# Patient Record
Sex: Female | Born: 1935 | Hispanic: No | Smoking: Never smoker
Health system: Southern US, Community
[De-identification: ages and names within clinical notes are randomized; demographics above are authoritative.]

## PROBLEM LIST (undated history)

## (undated) DIAGNOSIS — S7221XA Displaced subtrochanteric fracture of right femur, initial encounter for closed fracture: Secondary | ICD-10-CM

## (undated) DIAGNOSIS — I10 Essential (primary) hypertension: Secondary | ICD-10-CM

## (undated) DIAGNOSIS — I471 Supraventricular tachycardia: Secondary | ICD-10-CM

## (undated) HISTORY — PX: ABDOMINAL HYSTERECTOMY: SUR658

---

## 1998-09-22 ENCOUNTER — Emergency Department (HOSPITAL_COMMUNITY): Admission: EM | Admit: 1998-09-22 | Discharge: 1998-09-22 | Payer: Self-pay | Admitting: Emergency Medicine

## 1998-09-28 ENCOUNTER — Emergency Department (HOSPITAL_COMMUNITY): Admission: EM | Admit: 1998-09-28 | Discharge: 1998-09-28 | Payer: Self-pay | Admitting: Emergency Medicine

## 1999-09-20 ENCOUNTER — Encounter: Admission: RE | Admit: 1999-09-20 | Discharge: 1999-09-20 | Payer: Self-pay | Admitting: Family Medicine

## 1999-09-22 ENCOUNTER — Encounter: Admission: RE | Admit: 1999-09-22 | Discharge: 1999-09-22 | Payer: Self-pay | Admitting: Family Medicine

## 1999-11-24 ENCOUNTER — Encounter: Admission: RE | Admit: 1999-11-24 | Discharge: 1999-11-24 | Payer: Self-pay | Admitting: Family Medicine

## 2003-12-20 ENCOUNTER — Inpatient Hospital Stay (HOSPITAL_COMMUNITY): Admission: EM | Admit: 2003-12-20 | Discharge: 2003-12-24 | Payer: Self-pay | Admitting: Emergency Medicine

## 2004-01-20 ENCOUNTER — Encounter: Admission: RE | Admit: 2004-01-20 | Discharge: 2004-01-20 | Payer: Self-pay | Admitting: Sports Medicine

## 2006-09-19 DIAGNOSIS — F411 Generalized anxiety disorder: Secondary | ICD-10-CM

## 2006-09-19 DIAGNOSIS — I10 Essential (primary) hypertension: Secondary | ICD-10-CM | POA: Insufficient documentation

## 2007-01-10 ENCOUNTER — Emergency Department (HOSPITAL_COMMUNITY): Admission: EM | Admit: 2007-01-10 | Discharge: 2007-01-10 | Payer: Self-pay | Admitting: Emergency Medicine

## 2010-12-08 NOTE — H&P (Signed)
NAME:  Alicia Schwartz, SLIGHT                     ACCOUNT NO.:  [id number]   MEDICAL RECORD NO.:  [id number]                   PATIENT TYPE:  INP   LOCATION:  3715                                 FACILITY:  [facility]   PHYSICIAN:  Maple Mirza, P.A.              DATE OF BIRTH:  02-10-1931   DATE OF ADMISSION:  12/20/2003  DATE OF DISCHARGE:                                HISTORY & PHYSICAL   PRESENTING PROBLEM:  I had chest pain which circled around to my back two  days ago.   HISTORY OF PRESENT ILLNESS:  Alicia Schwartz is a 77 year old female with no  previous cardiac history.  She is not taking any medications at home at this  time.  She does have a history of gastroesophageal reflux.  After her Sunday  noon meal she had epigastric pain radiating to the back.  The patient says  that she knew better.  She was cooking spicy food and ate quite a bit of it.  She says that she has poor tolerance for this, usually.  The patient felt  epigastrically full, unable to burp, which might have helped relieve her  symptoms.  She took TUMS and Mylanta without alleviating her pain. The pain  eventually resolved when she came to the emergency room, brought by a  neighbor.  She was given aspirin and nitroglycerin sublingually at that  time.  She was found to be hypertensive with a systolic blood pressure of  189.  She was placed on Lopressor, HCTZ and Protonix.  She has had no chest  pain since and she is not having chest pain at the time of this examination.  Electrocardiogram on admission was non-diagnostic with no definitive ST  changes.  Cardiac enzymes x4 are negative.  She has no prior history of  prior cardiac catheterization or even Cardiolite study.   ALLERGIES:  No known drug allergies.   HOME MEDICATIONS:  None.   CURRENT MEDICATIONS:  1. Protonix 40 mg daily.  2. Metoprolol 12.5 mg twice daily.   PAST MEDICAL HISTORY:  1. Hypertension.  2. Question of dyslipidemia.  3. GERD.   SOCIAL HISTORY:  The patient is a widow.  She lives in [place], alone.  She is a retired Data processing manager.  She does not smoke, drink or use  alcoholic beverages.   FAMILY HISTORY:  Mother died at age 97 of Alzheimer's.  Father died after  World War II.  The patient is uncertain of his medical history.  She has  three sisters who are living with no known coronary artery disease, no  diabetes.  She has one sister who died.  She had cancer.   REVIEW OF SYSTEMS:  GENERAL:  The patient is not experiencing in the  immediate pre-hospitalization period fevers, chills, sweats, weight change  or adenopathy.  HEENT:  She has had no recent epistaxis, voice changes,  vertigo or photophobia.  INTEGUMENT:  There are no rashes,  lesions.  CARDIOPULMONARY:  The patient is not having chest pain at the current time.  She is not short of breath.  She has no dyspnea on exertion.  She is a  walker.  She has some palpitations, possibly when she walks.  She describes  her heart as rapid. She has no dependent edema.  No history of syncope or  presyncope.  No claudication symptoms.  UROGENITAL:  The patient apparently  drinks quite a lot of water, she says.  She has nocturia two times per  night.  NEUROPSYCHIATRIC:  No evidence of depression.  Mild anxiety.  MUSCULOSKELETAL:  No arthralgia and no joint swelling deformity or pain.  GI:  GERD.  She has no history of GI bleed either hematemesis or melenic  stool.  ENDOCRINE:  She has no prior endocrine history.  The patient also  denies thyroid disease or work up for thyroid disease.  No history of  diabetes, myocardial infarction, CVA, pulmonary embolism or deep venous  thrombosis or seizure.   PHYSICAL EXAMINATION:  GENERAL:  This is an alert and oriented patient in no  acute distress, perhaps somewhat anxious.  VITAL SIGNS:  Temperature 98.4.  Pulse 67 and regular.  Respirations 21.  Blood pressure 120/67.  Oxygen saturation is 96% on room air.  HEENT:   Eyes, pupils, equal, round and reactive to light.  Extraocular  movements are intact.  The patient wears corrective lenses.  Nares patent.  Mucous membranes are pink and moist without lesion or erythema.  The patient  does not wear dentures.  NECK:  Supple.  No carotid bruits auscultated.  No jugular venous  distention, no cervical lymphadenopathy.  HEART:  Regular rate and rhythm.  S1, S2 with no murmur, gallop or rub.  LUNGS:  Clear to auscultation and percussion, bilaterally.  INTEGUMENT:  No rashes or lesions.  ABDOMEN:  Soft, nondistended.  Bowel sounds are present.  Abdominal aorta,  nonpulsatile.  No hepatosplenomegaly.  UROGENITAL AND RECTAL:  Deferred.  EXTREMITIES:  4/4 pulses, bilaterally at the radial arteries, 4/4 pedal  pulses, bilaterally.  No clubbing, cyanosis or edema.  MUSCULOSKELETAL:  No joint deformity of effusions.  No costovertebral angle  tenderness.  NEUROLOGIC:  No focal neurologic deficits.   Chest x-ray on May 30, tortuosity of the thoracic aorta.  Heart size normal.  Lungs, clear.  Electrocardiogram rate is 83, sinus rhythm, axis +60 degrees.  PR interval 150.  QRS 79.  QTC is 421.  No ST-T changes.   Lab studies, complete blood count, white cells 11, hemoglobin 12, hematocrit  34.5, platelet count 237,000.  Serum electrolytes, sodium 135, potassium  3.4, chloride 102, bicarbonate 24, BUN 10, creatinine 1, glucose 95,  magnesium 2.3, TSH 1.952, BNP less than 30.  Lipase 19, amylase 72.   IMPRESSION:  1. Admitted with chest pain.  Electrocardiogram non-diagnostic.  Cardiac     enzymes essentially negative.  2. Gastroesophageal reflux disease.  3. Hypertension.  4. Unknown lipid profile.   Once again, Troponin-I studies in serial fashion less than 0.05, then 0.01  and then less than 0.01 and then 0.05.   PLAN:  After discussion with Dr. Daleen Squibb who has seen the patient, discussed the patient's plan with her, the patient will have adenosine Cardiolite   study as an inpatient.  This is decided as an inpatient since the patient is  unable to obtain transportation easily to [facility] as an outpatient.  This  will be done December 22, 2003, adenosine  Cardiolite study.                                               Maple Mirza, P.A.   GM/MEDQ  D:  12/21/2003  T:  12/21/2003  Job:  [id number]

## 2010-12-08 NOTE — Discharge Summary (Signed)
NAME:  Alicia Schwartz, DEMOND                     ACCOUNT NO.:  [id number]   MEDICAL RECORD NO.:  [id number]                   PATIENT TYPE:  INP   LOCATION:  3715                                 FACILITY:  [facility]   PHYSICIAN:  Blanch Media, M.D.             DATE OF BIRTH:  [birth year]   DATE OF ADMISSION:  12/20/2003  DATE OF DISCHARGE:  12/24/2003                                 DISCHARGE SUMMARY   DISCHARGE DIAGNOSES:  1. Chest pain of noncardiac etiology.  2. Hypertension.  3. Gastroesophageal reflux disease.  4. Hyponatremia.   DISCHARGE MEDICATIONS:  1. Metoprolol 12.5 mg p.o. b.i.d.  2. Ranitidine 75 mg p.o. daily.  3. Multivitamins every day.  4. Iron pill every day.  5. Aspirin 81 mg p.o. daily.   LABORATORY AND X-RAY DATA:  On admission, cardiac enzymes x 3 were negative.  Lipid profile revealed total cholesterol 154, triglycerides 69, HDL 48, LDL  92.  Folate was normal.  CBC on admission revealed a white count of 11.0,  hemoglobin 12, hematocrit 34.5, platelets 237.  Basic metabolic panel on  admission revealed sodium 135, potassium 3.4, chloride 102, bicarb 24, BUN  10, creatinine 1, glucose 95, calcium 8.7.   CONSULTATIONS:  Cardiology:  [facility].   PROCEDURES:  1. Chest x-ray performed on admission.  Results:  There is no acute disease.  2. Adenosine Cardiolite study performed December 22, 2003.  Results:  Ejection     fraction 49%, no evidence of ischemia or infarction.   HISTORY OF PRESENT ILLNESS:  Alicia Schwartz is a 77 year old female who has no  previous cardiac history and was on no medications at the time, who noted  some epigastric pain radiating to her back after eating a meal.  The pain  was not relieved.  In the ER, she was found to have a systolic blood  pressure of 189 and was given treatment for this.  However, there were no  significant changes on EKG.   HOSPITAL COURSE:  #1.  CHEST PAIN OF NONCARDIAC ETIOLOGY:  As  stated above, the cardiac workup  turned out to be negative and, thus, the chest pain is being attributed to  gastroesophageal reflux disease.  The patient was placed on Protonix while  in the hospital and was also given Maalox for indigestion.  She had no  further bouts of chest pain and had no cardiac dysrhythmias on telemetry.  Also as stated above, cardiac enzymes were negative.  She was cleared from a  cardiology standpoint, and decision was made to keep her on aspirin and  metoprolol for blood pressure control.   #2.  GASTROESOPHAGEAL REFLUX DISEASE:  As stated above, the patient was  placed on Protonix while in hospital and had resolution of the chest and  abdominal pain.  She will be discharged with ranitidine 75 mg p.o. daily due  to financial concerns.  Hopefully this will help to control  her reflux  disease.   #3.  HYPONATREMIA:  As stated above, the patient had initial sodium of 137;  however, by hospital day #3, her sodium had dropped to 126.  I do not have  an explanation for this drop in sodium other than the patient was possibly  volume contracted.  Following a sodium of 126, the patient was given 100 ml  of normal saline per hour until the next day, and her sodium rose to 133 on  the day of discharge.  A urine sodium was collected, and it was 16.  Urine  osmolality was 142.  This dose support the hypothesis that the patient was  intravascularly dry and the reasoning that she improved with normal saline  infusion.   SUGGESTED FOLLOWUP ITEMS:  The patient should likely have a basic metabolic  panel drawn on hospital followup to make sure her sodium remains normal.   FOLLOW UP:  The patient has a followup appointment with Dr. Leitha Schuller at  [facility] on June 30 at 3:30 p.m.      Franchot Mimes, MD                         Blanch Media, M.D.    TV/MEDQ  D:  12/24/2003  T:  12/26/2003  Job:  [id number]

## 2012-07-25 ENCOUNTER — Ambulatory Visit: Payer: Self-pay | Admitting: Dietician

## 2012-08-22 ENCOUNTER — Ambulatory Visit: Payer: Self-pay | Admitting: Dietician

## 2015-02-11 ENCOUNTER — Encounter (HOSPITAL_COMMUNITY): Payer: Self-pay | Admitting: Emergency Medicine

## 2015-02-11 ENCOUNTER — Emergency Department (HOSPITAL_COMMUNITY): Payer: Medicare PPO

## 2015-02-11 ENCOUNTER — Inpatient Hospital Stay (HOSPITAL_COMMUNITY)
Admission: EM | Admit: 2015-02-11 | Discharge: 2015-02-16 | DRG: 481 | Disposition: A | Discharge status: Skilled Nursing Facility | Payer: Medicare PPO | Attending: Internal Medicine | Admitting: Internal Medicine

## 2015-02-11 DIAGNOSIS — Y92480 Sidewalk as the place of occurrence of the external cause: Secondary | ICD-10-CM

## 2015-02-11 DIAGNOSIS — I471 Supraventricular tachycardia, unspecified: Secondary | ICD-10-CM | POA: Diagnosis not present

## 2015-02-11 DIAGNOSIS — Z419 Encounter for procedure for purposes other than remedying health state, unspecified: Secondary | ICD-10-CM

## 2015-02-11 DIAGNOSIS — S72141A Displaced intertrochanteric fracture of right femur, initial encounter for closed fracture: Principal | ICD-10-CM | POA: Diagnosis present

## 2015-02-11 DIAGNOSIS — S72001A Fracture of unspecified part of neck of right femur, initial encounter for closed fracture: Secondary | ICD-10-CM | POA: Diagnosis not present

## 2015-02-11 DIAGNOSIS — Z8781 Personal history of (healed) traumatic fracture: Secondary | ICD-10-CM

## 2015-02-11 DIAGNOSIS — F419 Anxiety disorder, unspecified: Secondary | ICD-10-CM | POA: Diagnosis present

## 2015-02-11 DIAGNOSIS — D62 Acute posthemorrhagic anemia: Secondary | ICD-10-CM | POA: Diagnosis not present

## 2015-02-11 DIAGNOSIS — M25551 Pain in right hip: Secondary | ICD-10-CM | POA: Diagnosis present

## 2015-02-11 DIAGNOSIS — F411 Generalized anxiety disorder: Secondary | ICD-10-CM | POA: Diagnosis present

## 2015-02-11 DIAGNOSIS — W1849XA Other slipping, tripping and stumbling without falling, initial encounter: Secondary | ICD-10-CM | POA: Diagnosis not present

## 2015-02-11 DIAGNOSIS — Z79899 Other long term (current) drug therapy: Secondary | ICD-10-CM | POA: Diagnosis not present

## 2015-02-11 DIAGNOSIS — Z9889 Other specified postprocedural states: Secondary | ICD-10-CM

## 2015-02-11 DIAGNOSIS — W010XXA Fall on same level from slipping, tripping and stumbling without subsequent striking against object, initial encounter: Secondary | ICD-10-CM | POA: Diagnosis present

## 2015-02-11 DIAGNOSIS — Y9301 Activity, walking, marching and hiking: Secondary | ICD-10-CM

## 2015-02-11 DIAGNOSIS — S7221XA Displaced subtrochanteric fracture of right femur, initial encounter for closed fracture: Secondary | ICD-10-CM | POA: Diagnosis not present

## 2015-02-11 DIAGNOSIS — I10 Essential (primary) hypertension: Secondary | ICD-10-CM | POA: Diagnosis not present

## 2015-02-11 DIAGNOSIS — W19XXXA Unspecified fall, initial encounter: Secondary | ICD-10-CM

## 2015-02-11 HISTORY — DX: Displaced subtrochanteric fracture of right femur, initial encounter for closed fracture: S72.21XA

## 2015-02-11 HISTORY — DX: Supraventricular tachycardia: I47.1

## 2015-02-11 HISTORY — DX: Essential (primary) hypertension: I10

## 2015-02-11 LAB — CBC
HEMATOCRIT: 32.6 % — AB (ref 36.0–46.0)
HEMOGLOBIN: 11.4 g/dL — AB (ref 12.0–15.0)
MCH: 34.3 pg — AB (ref 26.0–34.0)
MCHC: 35 g/dL (ref 30.0–36.0)
MCV: 98.2 fL (ref 78.0–100.0)
PLATELETS: 328 10*3/uL (ref 150–400)
RBC: 3.32 MIL/uL — ABNORMAL LOW (ref 3.87–5.11)
RDW: 12.1 % (ref 11.5–15.5)
WBC: 10.6 10*3/uL — AB (ref 4.0–10.5)

## 2015-02-11 LAB — BASIC METABOLIC PANEL
Anion gap: 11 (ref 5–15)
BUN: 14 mg/dL (ref 6–20)
CALCIUM: 9.2 mg/dL (ref 8.9–10.3)
CHLORIDE: 101 mmol/L (ref 101–111)
CO2: 22 mmol/L (ref 22–32)
CREATININE: 1.29 mg/dL — AB (ref 0.44–1.00)
GFR, EST AFRICAN AMERICAN: 43 mL/min — AB (ref >60)
GFR, EST NON AFRICAN AMERICAN: 37 mL/min — AB (ref >60)
GLUCOSE: 169 mg/dL — AB (ref 65–99)
POTASSIUM: 3.6 mmol/L (ref 3.5–5.1)
Sodium: 134 mmol/L — ABNORMAL LOW (ref 135–145)

## 2015-02-11 LAB — ABO/RH: ABO/RH(D): B POS

## 2015-02-11 LAB — TYPE AND SCREEN
ABO/RH(D): B POS
ANTIBODY SCREEN: NEGATIVE

## 2015-02-11 LAB — PROTIME-INR
INR: 0.98 (ref 0.00–1.49)
PROTHROMBIN TIME: 13.2 s (ref 11.6–15.2)

## 2015-02-11 MED ORDER — ONDANSETRON HCL 4 MG/2ML IJ SOLN
4.0000 mg | Freq: Once | INTRAMUSCULAR | Status: AC
  Administered 2015-02-11: 4 mg via INTRAVENOUS
  Filled 2015-02-11: qty 2

## 2015-02-11 MED ORDER — ALUM & MAG HYDROXIDE-SIMETH 200-200-20 MG/5ML PO SUSP: 30.0000 mL | Freq: Four times a day (QID) | ORAL | Status: DC | PRN

## 2015-02-11 MED ORDER — ONDANSETRON HCL 4 MG/2ML IJ SOLN: 4.0000 mg | Freq: Four times a day (QID) | INTRAMUSCULAR | Status: DC | PRN

## 2015-02-11 MED ORDER — ACETAMINOPHEN 650 MG RE SUPP: 650.0000 mg | Freq: Four times a day (QID) | RECTAL | Status: DC | PRN

## 2015-02-11 MED ORDER — OXYCODONE HCL 5 MG PO TABS
5.0000 mg | ORAL_TABLET | ORAL | Status: DC | PRN
  Administered 2015-02-12: 5 mg via ORAL
  Filled 2015-02-11: qty 1

## 2015-02-11 MED ORDER — MORPHINE SULFATE 4 MG/ML IJ SOLN
4.0000 mg | Freq: Once | INTRAMUSCULAR | Status: AC
  Administered 2015-02-11: 4 mg via INTRAVENOUS
  Filled 2015-02-11: qty 1

## 2015-02-11 MED ORDER — SODIUM CHLORIDE 0.9 % IV SOLN
INTRAVENOUS | Status: DC
  Administered 2015-02-11: 75 mL/h via INTRAVENOUS

## 2015-02-11 MED ORDER — ONDANSETRON HCL 4 MG PO TABS: 4.0000 mg | ORAL_TABLET | Freq: Four times a day (QID) | ORAL | Status: DC | PRN

## 2015-02-11 MED ORDER — HYDROMORPHONE HCL 1 MG/ML IJ SOLN
0.5000 mg | INTRAMUSCULAR | Status: DC | PRN
  Administered 2015-02-11: 0.5 mg via INTRAVENOUS
  Filled 2015-02-11: qty 1

## 2015-02-11 MED ORDER — ACETAMINOPHEN 325 MG PO TABS: 650.0000 mg | ORAL_TABLET | Freq: Four times a day (QID) | ORAL | Status: DC | PRN

## 2015-02-11 NOTE — ED Notes (Signed)
[facility] called for transport. 

## 2015-02-11 NOTE — ED Provider Notes (Signed)
CSN: [id number]     Arrival date & time 02/11/15  1642 History   First MD Initiated Contact with Patient 02/11/15 1704     Chief Complaint  Patient presents with  . Fall     (Consider location/radiation/quality/duration/timing/severity/associated sxs/prior Treatment) HPI  Pt presenting with right sided hip pain after mechanical fall.  She states she got tripped on a sidewalk.  She denies striking her head, no neck or back pain.  She was brought in by EMS, given fentanyl which helped somewhat with her pain.  Pain is worse with movement and palpation.  Fall occurred just prior to arrival.  Pain is constant. There are no other associated systemic symptoms, there are no other alleviating or modifying factors.   Past Medical History  Diagnosis Date  . Hypertension   . Fracture, subtrochanteric, right femur, closed 02/11/2015  . Paroxysmal supraventricular tachycardia 02/12/2015   Past Surgical History  Procedure Laterality Date  . Abdominal hysterectomy     Family History  Problem Relation Age of Onset  . Alzheimer's disease Mother   . Prostate cancer Father    History  Substance Use Topics  . Smoking status: Never Smoker   . Smokeless tobacco: Not on file  . Alcohol Use: No   OB History    No data available     Review of Systems  ROS reviewed and all otherwise negative except for mentioned in HPI    Allergies  Review of patient's allergies indicates not on file.  Home Medications   Prior to Admission medications   Medication Sig Start Date End Date Taking? Authorizing Provider  losartan (COZAAR) 25 MG tablet Take 1 tablet by mouth daily. 01/14/15  Yes Historical Provider, MD  Multiple Vitamins-Minerals (MULTIVITAMIN GUMMIES ADULT PO) Take 1 capsule by mouth daily.   Yes Historical Provider, MD  spironolactone (ALDACTONE) 25 MG tablet Take 1 tablet by mouth daily. 01/22/15  Yes Historical Provider, MD  baclofen (LIORESAL) 10 MG tablet Take 1 tablet (10 mg total) by mouth 3  (three) times daily. As needed for muscle spasm 02/12/15   Teryl Lucy, MD  enoxaparin (LOVENOX) 40 MG/0.4ML injection Inject 0.4 mLs (40 mg total) into the skin daily. 02/12/15   Teryl Lucy, MD  HYDROcodone-acetaminophen (NORCO) 5-325 MG per tablet Take 1-2 tablets by mouth every 6 (six) hours as needed for moderate pain. MAXIMUM TOTAL ACETAMINOPHEN DOSE IS 4000 MG PER DAY 02/12/15   Teryl Lucy, MD  sennosides-docusate sodium (SENOKOT-S) 8.6-50 MG tablet Take 2 tablets by mouth daily. 02/12/15   Teryl Lucy, MD   BP 149/76 mmHg  Pulse 85  Temp(Src) 97.9 F (36.6 C) (Oral)  Resp 17  Ht  (1.473 m)  Wt 115 lb 8.3 oz (52.4 kg)  BMI 24.15 kg/m2  SpO2 100%  Vitals reviewed Physical Exam  Physical Examination: General appearance - alert, well appearing, and in no distress Mental status - alert, oriented to person, place, and time Eyes - no conjunctival injection, no scleral icterus Neck - no midline tenderness to palpation Chest - clear to auscultation, no wheezes, rales or rhonchi, symmetric air entry Heart - normal rate, regular rhythm, normal S1, S2, no murmurs, rubs, clicks or gallops Back exam - no midline tenderness to palpation Neurological - alert, oriented, normal speech, strength 5/5 in extremities x 4, sensation intact Musculoskeletal - right hip and thigh with tenderness to palpation, right lower extremity externally rotated, otherwise no joint tenderness, deformity or swelling Extremities - peripheral pulses normal, no pedal  edema, no clubbing or cyanosis Skin - normal coloration and turgor, no rashes  ED Course  Procedures (including critical care time) Labs Review Labs Reviewed  CBC - Abnormal; Notable for the following:    WBC 10.6 (*)    RBC 3.32 (*)    Hemoglobin 11.4 (*)    HCT 32.6 (*)    MCH 34.3 (*)    All other components within normal limits  BASIC METABOLIC PANEL - Abnormal; Notable for the following:    Sodium 134 (*)    Glucose, Bld 169 (*)     Creatinine, Ser 1.29 (*)    GFR calc non Af Amer 37 (*)    GFR calc Af Amer 43 (*)    All other components within normal limits  BASIC METABOLIC PANEL - Abnormal; Notable for the following:    Sodium 132 (*)    Glucose, Bld 133 (*)    Creatinine, Ser 1.25 (*)    Calcium 8.8 (*)    GFR calc non Af Amer 39 (*)    GFR calc Af Amer 45 (*)    All other components within normal limits  CBC - Abnormal; Notable for the following:    WBC 13.3 (*)    RBC 3.00 (*)    Hemoglobin 10.1 (*)    HCT 29.1 (*)    All other components within normal limits  BASIC METABOLIC PANEL - Abnormal; Notable for the following:    Sodium 130 (*)    Chloride 100 (*)    Glucose, Bld 100 (*)    Creatinine, Ser 1.04 (*)    Calcium 8.2 (*)    GFR calc non Af Amer 49 (*)    GFR calc Af Amer 56 (*)    All other components within normal limits  CBC - Abnormal; Notable for the following:    WBC 14.2 (*)    RBC 3.49 (*)    Hemoglobin 11.0 (*)    HCT 31.6 (*)    RDW 16.2 (*)    All other components within normal limits  POCT I-STAT 4, (NA,K, GLUC, HGB,HCT) - Abnormal; Notable for the following:    Sodium 134 (*)    Glucose, Bld 123 (*)    HCT 26.0 (*)    Hemoglobin 8.8 (*)    All other components within normal limits  SURGICAL PCR SCREEN  PROTIME-INR  CBC  BASIC METABOLIC PANEL  TYPE AND SCREEN  ABO/RH  PREPARE RBC (CROSSMATCH)  TYPE AND SCREEN  ABO/RH    Imaging Review Pelvis Portable  02/12/2015   CLINICAL DATA:  Postop ORIF intertrochanteric right femoral neck fracture.  EXAM: PORTABLE PELVIS 1-2 VIEWS  COMPARISON:  Intraoperative examination earlier same day and 02/11/2015.  FINDINGS: ORIF of the intertrochanteric right femoral neck fracture with intramedullary nail and compression screw. Near anatomic alignment in the AP projection. No fractures elsewhere. Sclerotic focus in the inferior right acetabulum unchanged, likely benign bone island. Osseous demineralization.  IMPRESSION: Near anatomic  alignment post ORIF of the intertrochanteric right femoral neck fracture.   Electronically Signed   By: Hulan Saas M.D.   On: 02/12/2015 11:07   Dg Hip Operative Unilat With Pelvis Right  02/12/2015   CLINICAL DATA:  Proximal right femoral fracture  EXAM: OPERATIVE right HIP (WITH PELVIS IF PERFORMED) 4 VIEWS  TECHNIQUE: Fluoroscopic spot image(s) were submitted for interpretation post-operatively.  COMPARISON:  02/11/2015  FLUOROSCOPY TIME: 1 min 31 seconds  FINDINGS: Medullary rod is now seen within the right femur.  A fixation screw is noted traversing the femoral neck. Distal fixation screws noted as well. Fracture fragments are in near anatomic alignment.  IMPRESSION: Status post ORIF of proximal right femoral fracture   Electronically Signed   By: [name] M.D.   On: 02/12/2015 09:35     EKG Interpretation None      MDM   Final diagnoses:  Hip fracture, right, closed, initial encounter    Pt presenting with c/o pain in right hip, after mechanical fall.  No other areas with signs of injury, Xray shows right intertrochanteric fracture.   Xray images reviewed and interpreted by me as well.   Pt treated with pain meds in the ED.     7:38 PM d/w Dr. Dion Saucier, orthopedics.  He requests patient to be transferred to [facility] and will plan for OR tomorrow.   7:49 PM d/w Dr. Lovell Sheehan, triad for admission.    Jerelyn Scott, MD 02/13/15 [phone]

## 2015-02-11 NOTE — ED Notes (Signed)
Per EMS-fall walking on uneven sidewalk-complaining of right hip pain-no LOC, did not hit head-no over signs of shortening-100 mcg of Fentanyl given in route

## 2015-02-11 NOTE — ED Notes (Signed)
Bed: [facility] Expected date: 02/11/15 Expected time:  Means of arrival: Ambulance Comments: 81 yo Fall/HBP Right HIP/FEMUR

## 2015-02-11 NOTE — H&P (Signed)
[facility] Admission History and Physical       Alicia Schwartz MVH:[id number] DOB: [birth year] DOA: 02/11/2015  Referring physician: EDP PCP: No primary care provider on file.  Specialists:   Chief Complaint: Right Hip Pain after Falling  HPI: Alicia Schwartz is a 81 y.o. female with a history of HTN, who presents to the ED with complaints of 9/10 right hip pain after falling.  She reports that she was walking on uneven pavement and stumbled and fell onto her right hip.    She was brought to the ED via EMS, and in the ED was found on X-ray to have an Intertrochanteric fracture with Subtrochanteric extension of the Right Hip  Review of Systems:  Constitutional: No Weight Loss, No Weight Gain, Night Sweats, Fevers, Chills, Dizziness, Light Headedness, Fatigue, or Generalized Weakness HEENT: No Headaches, Difficulty Swallowing,Tooth/Dental Problems,Sore Throat,  No Sneezing, Rhinitis, Ear Ache, Nasal Congestion, or Post Nasal Drip,  Cardio-vascular:  No Chest pain, Orthopnea, PND, Edema in Lower Extremities, Anasarca, Dizziness, Palpitations  Resp: No Dyspnea, No DOE, No Productive Cough, No Non-Productive Cough, No Hemoptysis, No Wheezing.    GI: No Heartburn, Indigestion, Abdominal Pain, Nausea, Vomiting, Diarrhea, Constipation, Hematemesis, Hematochezia, Melena, Change in Bowel Habits,  Loss of Appetite  GU: No Dysuria, No Change in Color of Urine, No Urgency or Urinary Frequency, No Flank pain.  Musculoskeletal: +Right Hip Pain, with Decreased Range of Motion, No Back Pain.  Neurologic: No Syncope, No Seizures, Muscle Weakness, Paresthesia, Vision Disturbance or Loss, No Diplopia, No Vertigo, No Difficulty Walking,  Skin: No Rash or Lesions. Psych: No Change in Mood or Affect, No Depression or Anxiety, No Memory loss, No Confusion, or Hallucinations   Past Medical History  Diagnosis Date  . Hypertension      Past Surgical History  Procedure Laterality Date  .  Abdominal hysterectomy        Prior to Admission medications   Medication Sig Start Date End Date Taking? Authorizing Provider  losartan (COZAAR) 25 MG tablet Take 1 tablet by mouth daily. 01/14/15  Yes Historical Provider, MD  Multiple Vitamins-Minerals (MULTIVITAMIN GUMMIES ADULT PO) Take 1 capsule by mouth daily.   Yes Historical Provider, MD  spironolactone (ALDACTONE) 25 MG tablet Take 1 tablet by mouth daily. 01/22/15  Yes Historical Provider, MD     Allergies not on file  Social History:  reports that she has never smoked. She does not have any smokeless tobacco history on file. She reports that she does not drink alcohol. Her drug history is not on file.    Family History  Problem Relation Age of Onset  . Alzheimer's disease Mother   . Prostate cancer Father        Physical Exam:  GEN:  Pleasant Well Nourished and Well Developed Elderly  81 y.o. Caucasian female examined and in no acute distress; cooperative with exam Filed Vitals:   02/11/15 1656 02/11/15 1957  BP: 189/69 158/65  Pulse: 77 82  Temp: 98.2 F (36.8 C) 98 F (36.7 C)  TempSrc: Oral Oral  Resp: 18 16  SpO2: 99% 100%   Blood pressure 158/65, pulse 82, temperature 98 F (36.7 C), temperature source Oral, resp. rate 16, SpO2 100 %. PSYCH: She is alert and oriented x4; does not appear anxious does not appear depressed; affect is normal HEENT: Normocephalic and Atraumatic, Mucous membranes pink; PERRLA; EOM intact; Fundi:  Benign;  No scleral icterus, Nares: Patent, Oropharynx: Clear, Edentulous   Neck:  FROM, No  Cervical Lymphadenopathy nor Thyromegaly or Carotid Bruit; No JVD; Breasts:: Not examined CHEST WALL: No tenderness CHEST: Normal respiration, clear to auscultation bilaterally HEART: Regular rate and rhythm; no murmurs rubs or gallops BACK: No kyphosis or scoliosis; No CVA tenderness ABDOMEN: Positive Bowel Sounds, Scaphoid, Obese, Soft Non-Tender, No Rebound or Guarding; No Masses, No  Organomegaly, No Pannus; No Intertriginous candida. Rectal Exam: Not done EXTREMITIES: No Cyanosis, Clubbing, or Edema; No Ulcerations. Genitalia: not examined PULSES: 2+ and symmetric SKIN: Normal hydration no rash or ulceration CNS:  Alert and Oriented x 4, No Focal Deficits except HOH ( has Bilateral Hearing Aids) Vascular: pulses palpable throughout    Labs on Admission:  Basic Metabolic Panel:  Recent Labs Lab 02/11/15 1759  NA 134*  K 3.6  CL 101  CO2 22  GLUCOSE 169*  BUN 14  CREATININE 1.29*  CALCIUM 9.2   Liver Function Tests: No results for input(s): AST, ALT, ALKPHOS, BILITOT, PROT, ALBUMIN in the last 168 hours. No results for input(s): LIPASE, AMYLASE in the last 168 hours. No results for input(s): AMMONIA in the last 168 hours. CBC:  Recent Labs Lab 02/11/15 1759  WBC 10.6*  HGB 11.4*  HCT 32.6*  MCV 98.2  PLT 328   Cardiac Enzymes: No results for input(s): CKTOTAL, CKMB, CKMBINDEX, TROPONINI in the last 168 hours.  BNP (last 3 results) No results for input(s): BNP in the last 8760 hours.  ProBNP (last 3 results) No results for input(s): PROBNP in the last 8760 hours.  CBG: No results for input(s): GLUCAP in the last 168 hours.  Radiological Exams on Admission: Dg Hip Unilat With Pelvis 2-3 Views Right  02/11/2015   CLINICAL DATA:  Status post fall today. Severe right hip pain. Initial encounter.  EXAM: DG HIP (WITH OR WITHOUT PELVIS) 2-3V RIGHT  COMPARISON:  None.  FINDINGS: The patient has an intertrochanteric right femur fracture with subtrochanteric extension. The femoral head is located. No other acute abnormality is identified.  IMPRESSION: Intertrochanteric fracture with subtrochanteric extension.   Electronically Signed   By: Drusilla Kanner M.D.   On: 02/11/2015 18:05     EKG: Independently reviewed.    Assessment/Plan:   81 y.o. female with  Principal Problem:   1.     Closed right hip fracture/Hip fracture   Orthopedic: [place]:  Plan for Surgery in AM 02/12/2015   NPO after Midnight   Pain Control with IV Dilaudid PRN   Bedrest   Active Problems:   2.    Fall due to stumbling- Mechanical Fall      3.    HYPERTENSION, BENIGN SYSTEMIC   Home Meds include Losartan, and Spironolactone   IV Hydralazine PRN SBP > 160 while NPO     4.    Anxiety state   PRN IV Ativan     5.    DVT Prophylaxis   SCDs         Code Status:     FULL CODE      Family Communication:   Niece at Bedside   Disposition Plan:    Inpatient Status        Time spent:  53 Minutes      Ron Parker [facility] Pager [phone]   If 7AM -7PM Please Contact the Day Rounding Team MD for [facility]  If 7PM-7AM, Please Contact Night-Floor Coverage  [email or web address] Password [email or web address] 02/11/2015, 8:22 PM     ADDENDUM:   Patient was seen and examined on 02/11/2015

## 2015-02-12 ENCOUNTER — Inpatient Hospital Stay (HOSPITAL_COMMUNITY): Payer: Medicare PPO | Admitting: Anesthesiology

## 2015-02-12 ENCOUNTER — Inpatient Hospital Stay (HOSPITAL_COMMUNITY): Payer: Medicare PPO

## 2015-02-12 ENCOUNTER — Encounter (HOSPITAL_COMMUNITY): Payer: Self-pay | Admitting: Orthopedic Surgery

## 2015-02-12 ENCOUNTER — Encounter (HOSPITAL_COMMUNITY)
Admission: EM | Disposition: A | Discharge status: Skilled Nursing Facility | Payer: Self-pay | Source: Home / Self Care | Attending: Internal Medicine

## 2015-02-12 DIAGNOSIS — I471 Supraventricular tachycardia, unspecified: Secondary | ICD-10-CM

## 2015-02-12 HISTORY — DX: Supraventricular tachycardia, unspecified: I47.10

## 2015-02-12 HISTORY — DX: Supraventricular tachycardia: I47.1

## 2015-02-12 HISTORY — PX: INTRAMEDULLARY (IM) NAIL INTERTROCHANTERIC: SHX5875

## 2015-02-12 LAB — POCT I-STAT 4, (NA,K, GLUC, HGB,HCT)
Glucose, Bld: 123 mg/dL — ABNORMAL HIGH (ref 65–99)
HCT: 26 % — ABNORMAL LOW (ref 36.0–46.0)
HEMOGLOBIN: 8.8 g/dL — AB (ref 12.0–15.0)
POTASSIUM: 4.2 mmol/L (ref 3.5–5.1)
Sodium: 134 mmol/L — ABNORMAL LOW (ref 135–145)

## 2015-02-12 LAB — BASIC METABOLIC PANEL
Anion gap: 6 (ref 5–15)
BUN: 9 mg/dL (ref 6–20)
CO2: 23 mmol/L (ref 22–32)
Calcium: 8.8 mg/dL — ABNORMAL LOW (ref 8.9–10.3)
Chloride: 103 mmol/L (ref 101–111)
Creatinine, Ser: 1.25 mg/dL — ABNORMAL HIGH (ref 0.44–1.00)
GFR, EST AFRICAN AMERICAN: 45 mL/min — AB (ref >60)
GFR, EST NON AFRICAN AMERICAN: 39 mL/min — AB (ref >60)
Glucose, Bld: 133 mg/dL — ABNORMAL HIGH (ref 65–99)
Potassium: 4.2 mmol/L (ref 3.5–5.1)
SODIUM: 132 mmol/L — AB (ref 135–145)

## 2015-02-12 LAB — CBC
HCT: 29.1 % — ABNORMAL LOW (ref 36.0–46.0)
HEMOGLOBIN: 10.1 g/dL — AB (ref 12.0–15.0)
MCH: 33.7 pg (ref 26.0–34.0)
MCHC: 34.7 g/dL (ref 30.0–36.0)
MCV: 97 fL (ref 78.0–100.0)
Platelets: 309 10*3/uL (ref 150–400)
RBC: 3 MIL/uL — ABNORMAL LOW (ref 3.87–5.11)
RDW: 12.2 % (ref 11.5–15.5)
WBC: 13.3 10*3/uL — AB (ref 4.0–10.5)

## 2015-02-12 LAB — ABO/RH: ABO/RH(D): B POS

## 2015-02-12 LAB — SURGICAL PCR SCREEN
MRSA, PCR: NEGATIVE
Staphylococcus aureus: NEGATIVE

## 2015-02-12 LAB — PREPARE RBC (CROSSMATCH)

## 2015-02-12 SURGERY — FIXATION, FRACTURE, INTERTROCHANTERIC, WITH INTRAMEDULLARY ROD
Anesthesia: General | Site: Leg Upper | Laterality: Right

## 2015-02-12 MED ORDER — PHENOL 1.4 % MT LIQD: 1.0000 | OROMUCOSAL | Status: DC | PRN

## 2015-02-12 MED ORDER — BISACODYL 10 MG RE SUPP: 10.0000 mg | Freq: Every day | RECTAL | Status: DC | PRN

## 2015-02-12 MED ORDER — DEXAMETHASONE SODIUM PHOSPHATE 4 MG/ML IJ SOLN
INTRAMUSCULAR | Status: AC
  Filled 2015-02-12: qty 1

## 2015-02-12 MED ORDER — THIAMINE HCL 100 MG/ML IJ SOLN: 100.0000 mg | Freq: Every day | INTRAMUSCULAR | Status: DC

## 2015-02-12 MED ORDER — ACETAMINOPHEN 325 MG PO TABS: 650.0000 mg | ORAL_TABLET | Freq: Four times a day (QID) | ORAL | Status: DC | PRN

## 2015-02-12 MED ORDER — POLYETHYLENE GLYCOL 3350 17 G PO PACK
17.0000 g | PACK | Freq: Every day | ORAL | Status: DC | PRN
  Filled 2015-02-12: qty 1

## 2015-02-12 MED ORDER — ESMOLOL HCL 10 MG/ML IV SOLN
INTRAVENOUS | Status: AC
  Filled 2015-02-12: qty 10

## 2015-02-12 MED ORDER — ENOXAPARIN SODIUM 40 MG/0.4ML ~~LOC~~ SOLN
40.0000 mg | SUBCUTANEOUS | Status: DC
  Administered 2015-02-13: 40 mg via SUBCUTANEOUS
  Filled 2015-02-12: qty 0.4

## 2015-02-12 MED ORDER — FOLIC ACID 1 MG PO TABS: 1.0000 mg | ORAL_TABLET | Freq: Every day | ORAL | Status: DC

## 2015-02-12 MED ORDER — MORPHINE SULFATE 2 MG/ML IJ SOLN
2.0000 mg | INTRAMUSCULAR | Status: DC | PRN
  Administered 2015-02-12: 2 mg via INTRAVENOUS
  Filled 2015-02-12: qty 1

## 2015-02-12 MED ORDER — ACETAMINOPHEN 650 MG RE SUPP: 650.0000 mg | Freq: Four times a day (QID) | RECTAL | Status: DC | PRN

## 2015-02-12 MED ORDER — LIDOCAINE HCL (CARDIAC) 20 MG/ML IV SOLN
INTRAVENOUS | Status: DC | PRN
  Administered 2015-02-12: 20 mg via INTRAVENOUS

## 2015-02-12 MED ORDER — FENTANYL CITRATE (PF) 100 MCG/2ML IJ SOLN
INTRAMUSCULAR | Status: AC
  Administered 2015-02-12: 50 ug via INTRAVENOUS
  Filled 2015-02-12: qty 2

## 2015-02-12 MED ORDER — ONDANSETRON HCL 4 MG PO TABS: 4.0000 mg | ORAL_TABLET | Freq: Four times a day (QID) | ORAL | Status: DC | PRN

## 2015-02-12 MED ORDER — SENNA 8.6 MG PO TABS
1.0000 | ORAL_TABLET | Freq: Two times a day (BID) | ORAL | Status: DC
  Administered 2015-02-12 – 2015-02-16 (×7): 8.6 mg via ORAL
  Filled 2015-02-12 (×8): qty 1

## 2015-02-12 MED ORDER — CEFAZOLIN SODIUM-DEXTROSE 2-3 GM-% IV SOLR
2.0000 g | INTRAVENOUS | Status: AC
  Administered 2015-02-12: 2 g via INTRAVENOUS
  Filled 2015-02-12: qty 50

## 2015-02-12 MED ORDER — PROPOFOL 10 MG/ML IV BOLUS
INTRAVENOUS | Status: AC
  Filled 2015-02-12: qty 20

## 2015-02-12 MED ORDER — ONDANSETRON HCL 4 MG/2ML IJ SOLN
4.0000 mg | Freq: Four times a day (QID) | INTRAMUSCULAR | Status: DC | PRN
Start: 2015-02-12 — End: 2015-02-16

## 2015-02-12 MED ORDER — PROPOFOL 10 MG/ML IV BOLUS
INTRAVENOUS | Status: DC | PRN
  Administered 2015-02-12: 70 mg via INTRAVENOUS

## 2015-02-12 MED ORDER — LORAZEPAM 2 MG/ML IJ SOLN: 1.0000 mg | Freq: Four times a day (QID) | INTRAMUSCULAR | Status: DC | PRN

## 2015-02-12 MED ORDER — HYDROCODONE-ACETAMINOPHEN 5-325 MG PO TABS: 1.0000 | ORAL_TABLET | Freq: Four times a day (QID) | ORAL | Status: DC | PRN

## 2015-02-12 MED ORDER — CEFAZOLIN SODIUM-DEXTROSE 2-3 GM-% IV SOLR
2.0000 g | Freq: Two times a day (BID) | INTRAVENOUS | Status: AC
  Administered 2015-02-12 – 2015-02-13 (×2): 2 g via INTRAVENOUS
  Filled 2015-02-12 (×2): qty 50

## 2015-02-12 MED ORDER — FENTANYL CITRATE (PF) 250 MCG/5ML IJ SOLN
INTRAMUSCULAR | Status: AC
  Filled 2015-02-12: qty 5

## 2015-02-12 MED ORDER — SODIUM CHLORIDE 0.9 % IJ SOLN
INTRAMUSCULAR | Status: AC
  Filled 2015-02-12: qty 10

## 2015-02-12 MED ORDER — FERROUS SULFATE 325 (65 FE) MG PO TABS
325.0000 mg | ORAL_TABLET | Freq: Three times a day (TID) | ORAL | Status: DC
  Administered 2015-02-12 – 2015-02-16 (×11): 325 mg via ORAL
  Filled 2015-02-12 (×10): qty 1

## 2015-02-12 MED ORDER — FENTANYL CITRATE (PF) 250 MCG/5ML IJ SOLN
INTRAMUSCULAR | Status: DC | PRN
  Administered 2015-02-12 (×2): 50 ug via INTRAVENOUS
  Administered 2015-02-12: 100 ug via INTRAVENOUS

## 2015-02-12 MED ORDER — VITAMIN B-1 100 MG PO TABS: 100.0000 mg | ORAL_TABLET | Freq: Every day | ORAL | Status: DC

## 2015-02-12 MED ORDER — FENTANYL CITRATE (PF) 100 MCG/2ML IJ SOLN
INTRAMUSCULAR | Status: AC
  Filled 2015-02-12: qty 2

## 2015-02-12 MED ORDER — DEXTROSE 5 % IV SOLN
10.0000 mg/h | INTRAVENOUS | Status: AC
  Administered 2015-02-12: 10 mg/h via INTRAVENOUS
  Filled 2015-02-12: qty 100

## 2015-02-12 MED ORDER — MORPHINE SULFATE 2 MG/ML IJ SOLN
1.0000 mg | INTRAMUSCULAR | Status: DC | PRN
  Administered 2015-02-12 (×2): 1 mg via INTRAVENOUS
  Filled 2015-02-12 (×2): qty 1

## 2015-02-12 MED ORDER — ADULT MULTIVITAMIN W/MINERALS CH: 1.0000 | ORAL_TABLET | Freq: Every day | ORAL | Status: DC

## 2015-02-12 MED ORDER — 0.9 % SODIUM CHLORIDE (POUR BTL) OPTIME
TOPICAL | Status: DC | PRN
  Administered 2015-02-12: 1000 mL

## 2015-02-12 MED ORDER — ROCURONIUM BROMIDE 100 MG/10ML IV SOLN
INTRAVENOUS | Status: DC | PRN
  Administered 2015-02-12: 30 mg via INTRAVENOUS

## 2015-02-12 MED ORDER — ENOXAPARIN SODIUM 40 MG/0.4ML ~~LOC~~ SOLN: 40.0000 mg | SUBCUTANEOUS | Status: DC

## 2015-02-12 MED ORDER — DEXAMETHASONE SODIUM PHOSPHATE 4 MG/ML IJ SOLN
INTRAMUSCULAR | Status: DC | PRN
Start: 2015-02-12 — End: 2015-02-12
  Administered 2015-02-12: 4 mg via INTRAVENOUS

## 2015-02-12 MED ORDER — ROCURONIUM BROMIDE 50 MG/5ML IV SOLN
INTRAVENOUS | Status: AC
  Filled 2015-02-12: qty 1

## 2015-02-12 MED ORDER — ONDANSETRON HCL 4 MG/2ML IJ SOLN
INTRAMUSCULAR | Status: AC
  Filled 2015-02-12: qty 2

## 2015-02-12 MED ORDER — LIDOCAINE HCL (CARDIAC) 20 MG/ML IV SOLN
INTRAVENOUS | Status: AC
  Filled 2015-02-12: qty 5

## 2015-02-12 MED ORDER — SENNA-DOCUSATE SODIUM 8.6-50 MG PO TABS: 2.0000 | ORAL_TABLET | Freq: Every day | ORAL | Status: DC

## 2015-02-12 MED ORDER — ONDANSETRON HCL 4 MG/2ML IJ SOLN
INTRAMUSCULAR | Status: DC | PRN
  Administered 2015-02-12: 4 mg via INTRAVENOUS

## 2015-02-12 MED ORDER — PHENYLEPHRINE HCL 10 MG/ML IJ SOLN
10.0000 mg | INTRAMUSCULAR | Status: DC | PRN
  Administered 2015-02-12: 60 ug/min via INTRAVENOUS

## 2015-02-12 MED ORDER — HYDROCODONE-ACETAMINOPHEN 10-325 MG PO TABS: 1.0000 | ORAL_TABLET | Freq: Four times a day (QID) | ORAL | Status: DC | PRN

## 2015-02-12 MED ORDER — EPHEDRINE SULFATE 50 MG/ML IJ SOLN
INTRAMUSCULAR | Status: DC | PRN
  Administered 2015-02-12: 10 mg via INTRAVENOUS

## 2015-02-12 MED ORDER — NICOTINE 14 MG/24HR TD PT24: 14.0000 mg | MEDICATED_PATCH | Freq: Every day | TRANSDERMAL | Status: DC

## 2015-02-12 MED ORDER — LORAZEPAM 1 MG PO TABS: 1.0000 mg | ORAL_TABLET | Freq: Four times a day (QID) | ORAL | Status: DC | PRN

## 2015-02-12 MED ORDER — EPHEDRINE SULFATE 50 MG/ML IJ SOLN
INTRAMUSCULAR | Status: AC
  Filled 2015-02-12: qty 1

## 2015-02-12 MED ORDER — MENTHOL 3 MG MT LOZG: 1.0000 | LOZENGE | OROMUCOSAL | Status: DC | PRN

## 2015-02-12 MED ORDER — LACTATED RINGERS IV SOLN
INTRAVENOUS | Status: DC | PRN
  Administered 2015-02-12: 07:00:00 via INTRAVENOUS

## 2015-02-12 MED ORDER — HYDROCODONE-ACETAMINOPHEN 5-325 MG PO TABS
1.0000 | ORAL_TABLET | Freq: Four times a day (QID) | ORAL | Status: DC | PRN
  Administered 2015-02-12 – 2015-02-16 (×9): 2 via ORAL
  Filled 2015-02-12 (×10): qty 2

## 2015-02-12 MED ORDER — MAGNESIUM CITRATE PO SOLN: 1.0000 | Freq: Once | ORAL | Status: AC | PRN

## 2015-02-12 MED ORDER — SODIUM CHLORIDE 0.9 % IV SOLN
INTRAVENOUS | Status: DC
  Administered 2015-02-12 – 2015-02-13 (×3): via INTRAVENOUS

## 2015-02-12 MED ORDER — DOCUSATE SODIUM 100 MG PO CAPS
100.0000 mg | ORAL_CAPSULE | Freq: Two times a day (BID) | ORAL | Status: DC
  Administered 2015-02-12 – 2015-02-16 (×7): 100 mg via ORAL
  Filled 2015-02-12 (×7): qty 1

## 2015-02-12 MED ORDER — FENTANYL CITRATE (PF) 100 MCG/2ML IJ SOLN
25.0000 ug | INTRAMUSCULAR | Status: DC | PRN
  Administered 2015-02-12 (×3): 50 ug via INTRAVENOUS

## 2015-02-12 MED ORDER — ESMOLOL HCL 10 MG/ML IV SOLN
INTRAVENOUS | Status: DC | PRN
  Administered 2015-02-12: 50 mg via INTRAVENOUS

## 2015-02-12 MED ORDER — LACTATED RINGERS IV SOLN: INTRAVENOUS | Status: DC

## 2015-02-12 MED ORDER — ALUM & MAG HYDROXIDE-SIMETH 200-200-20 MG/5ML PO SUSP
30.0000 mL | ORAL | Status: DC | PRN
  Administered 2015-02-12 – 2015-02-15 (×3): 30 mL via ORAL
  Filled 2015-02-12 (×3): qty 30

## 2015-02-12 MED ORDER — SODIUM CHLORIDE 0.9 % IV SOLN: 10.0000 mL/h | Freq: Once | INTRAVENOUS | Status: DC

## 2015-02-12 MED ORDER — BACLOFEN 10 MG PO TABS: 10.0000 mg | ORAL_TABLET | Freq: Three times a day (TID) | ORAL | Status: DC

## 2015-02-12 SURGICAL SUPPLY — 43 items
APL SKNCLS STERI-STRIP NONHPOA (GAUZE/BANDAGES/DRESSINGS) ×1
BENZOIN TINCTURE PRP APPL 2/3 (GAUZE/BANDAGES/DRESSINGS) ×3 IMPLANT
BIT DRILL 4.0X195MM (BIT) IMPLANT
BLADE INTRAMED NAIL 11X85 (Orthopedic Implant) ×2 IMPLANT
BLADE INTRAMED NAIL 11X85MML (Orthopedic Implant) ×1 IMPLANT
BOOTCOVER CLEANROOM LRG (PROTECTIVE WEAR) ×6 IMPLANT
CLOSURE STERI-STRIP 1/2X4 (GAUZE/BANDAGES/DRESSINGS) ×1
CLSR STERI-STRIP ANTIMIC 1/2X4 (GAUZE/BANDAGES/DRESSINGS) ×2 IMPLANT
COVER PERINEAL POST (MISCELLANEOUS) ×3 IMPLANT
COVER SURGICAL LIGHT HANDLE (MISCELLANEOUS) ×3 IMPLANT
DRAPE STERI IOBAN 125X83 (DRAPES) ×3 IMPLANT
DRILL BIT 4.0X195MM (BIT) ×2
DRSG MEPILEX BORDER 4X4 (GAUZE/BANDAGES/DRESSINGS) ×4 IMPLANT
DRSG MEPILEX BORDER 4X8 (GAUZE/BANDAGES/DRESSINGS) IMPLANT
DURAPREP 26ML APPLICATOR (WOUND CARE) ×3 IMPLANT
ELECT CAUTERY BLADE 6.4 (BLADE) ×3 IMPLANT
ELECT REM PT RETURN 9FT ADLT (ELECTROSURGICAL) ×3
ELECTRODE REM PT RTRN 9FT ADLT (ELECTROSURGICAL) ×1 IMPLANT
EVACUATOR 1/8 PVC DRAIN (DRAIN) IMPLANT
FACESHIELD WRAPAROUND (MASK) ×6 IMPLANT
FACESHIELD WRAPAROUND OR TEAM (MASK) ×2 IMPLANT
GAUZE XEROFORM 5X9 LF (GAUZE/BANDAGES/DRESSINGS) ×3 IMPLANT
GLOVE BIOGEL PI ORTHO PRO SZ8 (GLOVE) ×4
GLOVE ORTHO TXT STRL SZ7.5 (GLOVE) ×6 IMPLANT
GLOVE PI ORTHO PRO STRL SZ8 (GLOVE) ×2 IMPLANT
GLOVE SURG ORTHO 8.0 STRL STRW (GLOVE) ×6 IMPLANT
GOWN STRL REUS W/ TWL LRG LVL3 (GOWN DISPOSABLE) IMPLANT
GOWN STRL REUS W/TWL LRG LVL3 (GOWN DISPOSABLE)
GUIDEWIRE 3.2X400 (WIRE) ×2 IMPLANT
KIT ROOM TURNOVER OR (KITS) ×3 IMPLANT
LINER BOOT UNIVERSAL DISP (MISCELLANEOUS) ×3 IMPLANT
MANIFOLD NEPTUNE II (INSTRUMENTS) ×3 IMPLANT
NAIL TROCHANTERIC 11MM 130DEG (Nail) ×2 IMPLANT
NS IRRIG 1000ML POUR BTL (IV SOLUTION) ×3 IMPLANT
PACK GENERAL/GYN (CUSTOM PROCEDURE TRAY) ×3 IMPLANT
PAD ARMBOARD 7.5X6 YLW CONV (MISCELLANEOUS) ×6 IMPLANT
SCREW LOCKING IM 5.0MX42M (Screw) ×3 IMPLANT
SUT VIC AB 0 CT2 27 (SUTURE) ×2 IMPLANT
SUT VIC AB 0 CTB1 27 (SUTURE) ×3 IMPLANT
SUT VIC AB 3-0 SH 8-18 (SUTURE) ×3 IMPLANT
TOWEL OR 17X24 6PK STRL BLUE (TOWEL DISPOSABLE) ×3 IMPLANT
TOWEL OR 17X26 10 PK STRL BLUE (TOWEL DISPOSABLE) ×3 IMPLANT
WATER STERILE IRR 1000ML POUR (IV SOLUTION) ×3 IMPLANT

## 2015-02-12 NOTE — Discharge Instructions (Signed)
Diet: As you were doing prior to hospitalization  ° °Shower:  May shower but keep the wounds dry, use an occlusive plastic wrap, NO SOAKING IN TUB.  If the bandage gets wet, change with a clean dry gauze. ° °Dressing:  You may change your dressing 3-5 days after surgery.  Then change the dressing daily with sterile gauze dressing.   ° °There are sticky tapes (steri-strips) on your wounds and all the stitches are absorbable.  Leave the steri-strips in place when changing your dressings, they will peel off with time, usually 2-3 weeks. ° °Activity:  Increase activity slowly as tolerated, but follow the weight bearing instructions below.  No lifting or driving for 6 weeks. ° °Weight Bearing:   As tolerated.   ° °To prevent constipation: you may use a stool softener such as - ° °Colace (over the counter) 100 mg by mouth twice a day  °Drink plenty of fluids (prune juice may be helpful) and high fiber foods °Miralax (over the counter) for constipation as needed.   ° °Itching:  If you experience itching with your medications, try taking only a single pain pill, or even half a pain pill at a time.  You may take up to 10 pain pills per day, and you can also use benadryl over the counter for itching or also to help with sleep.  ° °Precautions:  If you experience chest pain or shortness of breath - call 911 immediately for transfer to the hospital emergency department!! ° °If you develop a fever greater that 101 F, purulent drainage from wound, increased redness or drainage from wound, or calf pain -- Call the office at [phone]                                                °Follow- Up Appointment:  Please call for an appointment to be seen in 2 weeks [place] - [phone] ° ° ° ° ° °

## 2015-02-12 NOTE — Consult Note (Signed)
ORTHOPAEDIC CONSULTATION  REQUESTING PHYSICIAN: Ron Parker, MD  Chief Complaint: Right hip pain  HPI: Alicia Schwartz is a 81 y.o. female who complains of  acute severe right hip pain rated 10/10 with movement directly over the right side of her thigh. She apparently fell on an uneven sidewalk. Denies loss of consciousness. Past history for hypertension. Injury occurred today. Pain better with pain medication.  She denies history of alcohol use.  Past Medical History  Diagnosis Date  . Hypertension   . Fracture, subtrochanteric, right femur, closed 02/11/2015   Past Surgical History  Procedure Laterality Date  . Abdominal hysterectomy     History   Social History  . Marital Status: Widowed    Spouse Name: N/A  . Number of Children: N/A  . Years of Education: N/A   Social History Main Topics  . Smoking status: Never Smoker   . Smokeless tobacco: Not on file  . Alcohol Use: No  . Drug Use: Not on file  . Sexual Activity: Not on file   Other Topics Concern  . None   Social History Narrative   Family History  Problem Relation Age of Onset  . Alzheimer's disease Mother   . Prostate cancer Father    Not on File Prior to Admission medications   Medication Sig Start Date End Date Taking? Authorizing Provider  losartan (COZAAR) 25 MG tablet Take 1 tablet by mouth daily. 01/14/15  Yes Historical Provider, MD  Multiple Vitamins-Minerals (MULTIVITAMIN GUMMIES ADULT PO) Take 1 capsule by mouth daily.   Yes Historical Provider, MD  spironolactone (ALDACTONE) 25 MG tablet Take 1 tablet by mouth daily. 01/22/15  Yes Historical Provider, MD   Dg Hip Unilat With Pelvis 2-3 Views Right  02/11/2015   CLINICAL DATA:  Status post fall today. Severe right hip pain. Initial encounter.  EXAM: DG HIP (WITH OR WITHOUT PELVIS) 2-3V RIGHT  COMPARISON:  None.  FINDINGS: The patient has an intertrochanteric right femur fracture with subtrochanteric extension. The femoral head is  located. No other acute abnormality is identified.  IMPRESSION: Intertrochanteric fracture with subtrochanteric extension.   Electronically Signed   By: Drusilla Kanner M.D.   On: 02/11/2015 18:05    Positive ROS: All other systems have been reviewed and were otherwise negative with the exception of those mentioned in the HPI and as above.  Physical Exam: General: Alert, no acute distress, she is hard of hearing. Cardiovascular: No pedal edema Respiratory: No cyanosis, no use of accessory musculature GI: No organomegaly, abdomen is soft and non-tender Skin: No lesions in the area of chief complaint Neurologic: Sensation intact distally Psychiatric: Patient is competent for consent with normal mood and affect Lymphatic: No axillary or cervical lymphadenopathy  MUSCULOSKELETAL: Right hip is shortened and externally rotated. EHL and FHL are intact. Positive log roll. Sensation intact throughout her foot.  Assessment: Right subtrochanteric femur fracture, displaced with coexisting risk factors as indicated above  Plan: This is an acute severe injury which risks both morbidity and mortality. I recommended surgical intervention in order to stabilize her hip. We will plan for surgery tomorrow. Preoperative IV antibiotics per protocol along with the MRSA screen.  The risks benefits and alternatives were discussed with the patient including but not limited to the risks of nonoperative treatment, versus surgical intervention including infection, bleeding, nerve injury, malunion, nonunion, the need for revision surgery, hardware prominence, hardware failure, the need for hardware removal, blood clots, cardiopulmonary complications, morbidity, mortality, among others, and they were  willing to proceed.      Eulas Post, MD Cell [phone]   02/12/2015 12:51 AM

## 2015-02-12 NOTE — Progress Notes (Signed)
The patient has been re-examined, and the chart reviewed, and there have been no interval changes to the documented history and physical.    The risks, benefits, and alternatives have been discussed at length, and the patient is willing to proceed.    Greco Gastelum P, MD  

## 2015-02-12 NOTE — Anesthesia Procedure Notes (Signed)
Procedure Name: Intubation Date/Time: 02/12/2015 7:45 AM Performed by: Alanda Amass A Pre-anesthesia Checklist: Patient identified, Timeout performed, Emergency Drugs available, Suction available and Patient being monitored Patient Re-evaluated:Patient Re-evaluated prior to inductionOxygen Delivery Method: Circle system utilized Preoxygenation: Pre-oxygenation with 100% oxygen Intubation Type: IV induction Ventilation: Mask ventilation without difficulty Laryngoscope Size: Mac and 3 Grade View: Grade II Tube type: Oral Tube size: 7.5 mm Number of attempts: 1 Airway Equipment and Method: Stylet Placement Confirmation: ETT inserted through vocal cords under direct vision,  breath sounds checked- equal and bilateral and positive ETCO2 Secured at: 20 cm Tube secured with: Tape Dental Injury: Teeth and Oropharynx as per pre-operative assessment

## 2015-02-12 NOTE — Op Note (Signed)
DATE OF SURGERY:  02/12/2015  TIME: 9:15 AM  PATIENT NAME:  Alicia Schwartz  AGE: 81 y.o.  PRE-OPERATIVE DIAGNOSIS:  Right reverse obliquity intertrochanteric hip fracture  POST-OPERATIVE DIAGNOSIS:  SAME  PROCEDURE:  INTRAMEDULLARY (IM) NAIL INTERTROCHANTRIC  SURGEON:  Aracelis Ulrey P  ASSISTANT:  Janalee Dane, PA-C, present and scrubbed throughout the case, critical for assistance with exposure, retraction, instrumentation, and closure.  OPERATIVE IMPLANTS: Synthes trochanteric femoral nail size 3 20 x 11 with 85 mm interlocking helical blade into the femoral head and a single distal interlocking bolt.  PREOPERATIVE INDICATIONS:  Alicia Schwartz is a 81 y.o. year old who fell and suffered a hip fracture. She was brought into the ER and then admitted and optimized and then elected for surgical intervention.    The risks benefits and alternatives were discussed with the patient including but not limited to the risks of nonoperative treatment, versus surgical intervention including infection, bleeding, nerve injury, malunion, nonunion, hardware prominence, hardware failure, need for hardware removal, blood clots, cardiopulmonary complications, morbidity, mortality, among others, and they were willing to proceed.    Unique operative events: During induction of anesthesia she developed an episode of supraventricular tachycardia witnessed by Dr. Jairo Ben. She was fairly anemic prior to beginning of the case. This was felt to contribute. Blood was ordered and will be transfused. We will also plan to transfer her to telemetry.  OPERATIVE PROCEDURE:  The patient was brought to the operating room and placed in the supine position. General anesthesia was administered, with a foley. She was placed on the fracture table.  Closed reduction was performed under C-arm guidance. The length of the femur was also measured using fluoroscopy. Time out was then performed after sterile prep  and drape. She received preoperative antibiotics.  Incision was made proximal to the greater trochanter. A guidewire was placed in the appropriate position. Confirmation was made on AP and lateral views. The above-named nail was opened. I opened the proximal femur with a reamer. I then placed the nail by hand easily down. I did not need to ream the femur.  Once the nail was completely seated, I placed a guidepin into the femoral head into the center center position. I measured the length, and then reamed the lateral cortex and up into the head. I then placed the helical blade. Slight compression was applied. Anatomic fixation achieved. Bone quality was mediocre. The fracture had a tendency towards varus, which I was able to correct.  I then secured the proximal interlocking bolt, and took off a half a turn, and then removed the instruments, and then went distal.  I placed a single interlocking bolt distally to achieve rotational control, and I placed it in the dynamic hole to allow slight compression if necessary during the healing process. I confirmed its position on AP and lateral views.  I then took final C-arm pictures AP and lateral the entire length of the leg. Anatomic reconstruction was achieved, and the wounds were irrigated copiously and closed with Vicryl followed by Steri-Strips and sterile gauze for the skin. The patient was awakened and returned to PACU in stable and satisfactory condition. There no complications and the patient tolerated the procedure well.  She will be weightbearing as tolerated, and will be on Lovenox  for a period of three weeks after discharge.   Teryl Lucy, M.D.

## 2015-02-12 NOTE — Progress Notes (Signed)
[facility] PROGRESS NOTE  Emogene Muratalla UJW:[id number] DOB: Mar 23, 1931 DOA: 02/11/2015 PCP: No primary care provider on file.  Assessment/Plan:  Principal Problem:   Fracture, subtrochanteric, right femur, closed Active Problems:   Anxiety state   HYPERTENSION, BENIGN SYSTEMIC   Fall due to stumbling   Paroxysmal supraventricular tachycardia  Doing well postoperatively  HPI/Subjective: asleep Objective: Filed Vitals:   02/12/15 1245  BP: 146/61  Pulse: 71  Temp: 97.9 F (36.6 C)  Resp: 8    Intake/Output Summary (Last 24 hours) at 02/12/15 1308 Last data filed at 02/12/15 1139  Gross per 24 hour  Intake 976.25 ml  Output    225 ml  Net 751.25 ml   Filed Weights   02/11/15 2242 02/11/15 2330  Weight: 55.339 kg (122 lb) 52.4 kg (115 lb 8.3 oz)    Exam:   General:  Resting comfortabl  Cardiovascular: RRR  Respiratory: CTA  Abdomen: S, NT, ND  Ext: no CCE  Basic Metabolic Panel:  Recent Labs Lab 02/11/15 1759 02/12/15 0403 02/12/15 0822  NA 134* 132* 134*  K 3.6 4.2 4.2  CL 101 103  --   CO2 22 23  --   GLUCOSE 169* 133* 123*  BUN 14 9  --   CREATININE 1.29* 1.25*  --   CALCIUM 9.2 8.8*  --    Liver Function Tests: No results for input(s): AST, ALT, ALKPHOS, BILITOT, PROT, ALBUMIN in the last 168 hours. No results for input(s): LIPASE, AMYLASE in the last 168 hours. No results for input(s): AMMONIA in the last 168 hours. CBC:  Recent Labs Lab 02/11/15 1759 02/12/15 0403 02/12/15 0822  WBC 10.6* 13.3*  --   HGB 11.4* 10.1* 8.8*  HCT 32.6* 29.1* 26.0*  MCV 98.2 97.0  --   PLT 328 309  --    Cardiac Enzymes: No results for input(s): CKTOTAL, CKMB, CKMBINDEX, TROPONINI in the last 168 hours. BNP (last 3 results) No results for input(s): BNP in the last 8760 hours.  ProBNP (last 3 results) No results for input(s): PROBNP in the last 8760 hours.  CBG: No results for input(s): GLUCAP in the last 168 hours.  Recent  Results (from the past 240 hour(s))  Surgical pcr screen     Status: None   Collection Time: 02/12/15  1:41 AM  Result Value Ref Range Status   MRSA, PCR NEGATIVE NEGATIVE Final   Staphylococcus aureus NEGATIVE NEGATIVE Final    Comment:        The Xpert SA Assay (FDA approved for NASAL specimens in patients over 88 years of age), is one component of a comprehensive surveillance program.  Test performance has been validated by [facility] for patients greater than or equal to 19 year old. It is not intended to diagnose infection nor to guide or monitor treatment.      Studies: Pelvis Portable  02/12/2015   CLINICAL DATA:  Postop ORIF intertrochanteric right femoral neck fracture.  EXAM: PORTABLE PELVIS 1-2 VIEWS  COMPARISON:  Intraoperative examination earlier same day and 02/11/2015.  FINDINGS: ORIF of the intertrochanteric right femoral neck fracture with intramedullary nail and compression screw. Near anatomic alignment in the AP projection. No fractures elsewhere. Sclerotic focus in the inferior right acetabulum unchanged, likely benign bone island. Osseous demineralization.  IMPRESSION: Near anatomic alignment post ORIF of the intertrochanteric right femoral neck fracture.   Electronically Signed   By: Hulan Saas M.D.   On: 02/12/2015 11:07   Dg Hip Operative Unilat  With Pelvis Right  02/12/2015   CLINICAL DATA:  Proximal right femoral fracture  EXAM: OPERATIVE right HIP (WITH PELVIS IF PERFORMED) 4 VIEWS  TECHNIQUE: Fluoroscopic spot image(s) were submitted for interpretation post-operatively.  COMPARISON:  02/11/2015  FLUOROSCOPY TIME: 1 min 31 seconds  FINDINGS: Medullary rod is now seen within the right femur. A fixation screw is noted traversing the femoral neck. Distal fixation screws noted as well. Fracture fragments are in near anatomic alignment.  IMPRESSION: Status post ORIF of proximal right femoral fracture   Electronically Signed   By: [name] M.D.   On:  02/12/2015 09:35   Dg Hip Unilat With Pelvis 2-3 Views Right  02/11/2015   CLINICAL DATA:  Status post fall today. Severe right hip pain. Initial encounter.  EXAM: DG HIP (WITH OR WITHOUT PELVIS) 2-3V RIGHT  COMPARISON:  None.  FINDINGS: The patient has an intertrochanteric right femur fracture with subtrochanteric extension. The femoral head is located. No other acute abnormality is identified.  IMPRESSION: Intertrochanteric fracture with subtrochanteric extension.   Electronically Signed   By: Drusilla Kanner M.D.   On: 02/11/2015 18:05    Scheduled Meds: . sodium chloride  10 mL/hr Intravenous Once  . [id number]       Continuous Infusions: . sodium chloride    . lactated ringers      Time spent: 15 minutes  Maccoy Haubner L  [facility] [email or web address], password [facility] 02/12/2015, 1:08 PM  LOS: 1 day

## 2015-02-12 NOTE — Transfer of Care (Signed)
Immediate Anesthesia Transfer of Care Note  Patient: Alicia Schwartz  Procedure(s) Performed: Procedure(s): INTRAMEDULLARY (IM) NAIL INTERTROCHANTRIC (Right)  Patient Location: PACU  Anesthesia Type:General  Level of Consciousness: awake  Airway & Oxygen Therapy: Patient Spontanous Breathing and Patient connected to nasal cannula oxygen  Post-op Assessment: Report given to RN and Post -op Vital signs reviewed and stable  Post vital signs: Reviewed and stable  Last Vitals:  Filed Vitals:   02/12/15 0200  BP: 135/68  Pulse: 90  Temp: 37.5 C  Resp: 18    Complications: No apparent anesthesia complications

## 2015-02-12 NOTE — Anesthesia Preprocedure Evaluation (Addendum)
Anesthesia Evaluation  Patient identified by MRN, date of birth, ID band Patient awake    Reviewed: Allergy & Precautions, NPO status , Patient's Chart, lab work & pertinent test results  Airway Mallampati: II  TM Distance: >3 FB Neck ROM: Full    Dental  (+) Dental Advisory Given, Missing, Poor Dentition, Chipped   Pulmonary neg pulmonary ROS,  breath sounds clear to auscultation        Cardiovascular hypertension, Pt. on medications - anginaRhythm:Regular Rate:Normal  '05 stress: No evidence of ischemia or infarction. Ejection fraction 49%.    Neuro/Psych Anxiety negative neurological ROS     GI/Hepatic Neg liver ROS, GERD-  Poorly Controlled,  Endo/Other    Renal/GU Renal disease (creat 1.25)     Musculoskeletal   Abdominal   Peds  Hematology  (+) Blood dyscrasia (Hb 10.1), ,   Anesthesia Other Findings   Reproductive/Obstetrics                          Anesthesia Physical Anesthesia Plan  ASA: III  Anesthesia Plan: General   Post-op Pain Management:    Induction: Intravenous  Airway Management Planned: Oral ETT  Additional Equipment:   Intra-op Plan:   Post-operative Plan: Extubation in OR  Informed Consent: I have reviewed the patients History and Physical, chart, labs and discussed the procedure including the risks, benefits and alternatives for the proposed anesthesia with the patient or authorized representative who has indicated his/her understanding and acceptance.   Dental advisory given  Plan Discussed with: CRNA and Surgeon  Anesthesia Plan Comments: (Plan routine monitors, GETA)        Anesthesia Quick Evaluation

## 2015-02-12 NOTE — Progress Notes (Signed)
Orthopedic Tech Progress Note Patient Details:  Alicia Schwartz [birth year] [id number]  Patient ID: [name], female   DOB: [birth year], 81 y.o.   MRN: [id number] Pt unable to use trapeze bar patient helper  Nikki Dom 02/12/2015, 1:27 PM

## 2015-02-12 NOTE — Anesthesia Postprocedure Evaluation (Signed)
  Anesthesia Post-op Note  Patient: Alicia Schwartz  Procedure(s) Performed: Procedure(s): INTRAMEDULLARY (IM) NAIL INTERTROCHANTRIC (Right)  Patient Location: PACU  Anesthesia Type:General  Level of Consciousness: awake, alert , oriented and patient cooperative  Airway and Oxygen Therapy: Patient Spontanous Breathing  Post-op Pain: mild  Post-op Assessment: Post-op Vital signs reviewed, Patient's Cardiovascular Status Stable, Respiratory Function Stable, Patent Airway, No signs of Nausea or vomiting and Pain level controlled              Post-op Vital Signs: Reviewed and stable  Last Vitals:  Filed Vitals:   02/12/15 1139  BP:   Pulse: 72  Temp: 36.5 C  Resp: 11    Complications: No apparent anesthesia complications, brief intraop SVT has not recurred

## 2015-02-13 LAB — TYPE AND SCREEN
ABO/RH(D): B POS
Antibody Screen: NEGATIVE
UNIT DIVISION: 0
UNIT DIVISION: 0

## 2015-02-13 LAB — CBC
HCT: 31.6 % — ABNORMAL LOW (ref 36.0–46.0)
HEMOGLOBIN: 11 g/dL — AB (ref 12.0–15.0)
MCH: 31.5 pg (ref 26.0–34.0)
MCHC: 34.8 g/dL (ref 30.0–36.0)
MCV: 90.5 fL (ref 78.0–100.0)
Platelets: 220 10*3/uL (ref 150–400)
RBC: 3.49 MIL/uL — ABNORMAL LOW (ref 3.87–5.11)
RDW: 16.2 % — ABNORMAL HIGH (ref 11.5–15.5)
WBC: 14.2 10*3/uL — ABNORMAL HIGH (ref 4.0–10.5)

## 2015-02-13 LAB — BASIC METABOLIC PANEL
Anion gap: 8 (ref 5–15)
BUN: 11 mg/dL (ref 6–20)
CALCIUM: 8.2 mg/dL — AB (ref 8.9–10.3)
CO2: 22 mmol/L (ref 22–32)
Chloride: 100 mmol/L — ABNORMAL LOW (ref 101–111)
Creatinine, Ser: 1.04 mg/dL — ABNORMAL HIGH (ref 0.44–1.00)
GFR calc Af Amer: 56 mL/min — ABNORMAL LOW (ref >60)
GFR, EST NON AFRICAN AMERICAN: 49 mL/min — AB (ref >60)
GLUCOSE: 100 mg/dL — AB (ref 65–99)
Potassium: 4.8 mmol/L (ref 3.5–5.1)
SODIUM: 130 mmol/L — AB (ref 135–145)

## 2015-02-13 MED ORDER — ENOXAPARIN SODIUM 30 MG/0.3ML ~~LOC~~ SOLN
30.0000 mg | SUBCUTANEOUS | Status: DC
  Administered 2015-02-14 – 2015-02-16 (×3): 30 mg via SUBCUTANEOUS
  Filled 2015-02-13 (×3): qty 0.3

## 2015-02-13 MED ORDER — SPIRONOLACTONE 25 MG PO TABS
25.0000 mg | ORAL_TABLET | Freq: Every day | ORAL | Status: DC
  Administered 2015-02-13 – 2015-02-15 (×3): 25 mg via ORAL
  Filled 2015-02-13 (×5): qty 1

## 2015-02-13 MED ORDER — LOSARTAN POTASSIUM 25 MG PO TABS
25.0000 mg | ORAL_TABLET | Freq: Every day | ORAL | Status: DC
  Administered 2015-02-13 – 2015-02-16 (×4): 25 mg via ORAL
  Filled 2015-02-13 (×4): qty 1

## 2015-02-13 NOTE — Evaluation (Signed)
Occupational Therapy Evaluation Patient Details Name: Alfretta Pinch MRN: [id number] DOB: [birth year] Today's Date: 02/13/2015    History of Present Illness Pt fell while on a sidewalk resulting in R proximal femur fx requiring IM nail.   Clinical Impression   Pt was living alone independently prior to admission.  She present with anxiety related to pain and fear of falling and requires +2 assist for all mobility.  She will require SNF for ST rehab prior to return home. Will follow acutely.    Follow Up Recommendations  SNF;Supervision/Assistance - 24 hour    Equipment Recommendations       Recommendations for Other Services       Precautions / Restrictions Precautions Precautions: Fall Restrictions Other Position/Activity Restrictions: WBAT R LE      Mobility Bed Mobility Overal bed mobility: +2 for physical assistance;Needs Assistance Bed Mobility: Supine to Sit     Supine to sit: +2 for physical assistance;Max assist     General bed mobility comments: +2 with use of pad for hips and assist to raise trunk  Transfers Overall transfer level: Needs assistance Equipment used: Rolling walker (2 wheeled) Transfers: Sit to/from Stand;Stand Pivot Transfers Sit to Stand: +2 physical assistance;Max assist Stand pivot transfers: +2 physical assistance;Max assist       General transfer comment: verbal cues and physical assist to sequence walker use    Balance Overall balance assessment: Needs assistance Sitting-balance support: Feet supported Sitting balance-Leahy Scale: Fair       Standing balance-Leahy Scale: Poor                              ADL Overall ADL's : Needs assistance/impaired Eating/Feeding: Independent;Sitting   Grooming: Brushing hair;Sitting;Set up   Upper Body Bathing: Minimal assitance;Sitting   Lower Body Bathing: +2 for physical assistance;Total assistance;Sit to/from stand   Upper Body Dressing : Minimal  assistance;Sitting   Lower Body Dressing: Total assistance;+2 for physical assistance;Sit to/from stand   Toilet Transfer: +2 for physical assistance;Maximal assistance;Stand-pivot;RW   Toileting- Architect and Hygiene: Total assistance;+2 for physical assistance;Sit to/from stand       Functional mobility during ADLs: +2 for physical assistance;Maximal assistance;Rolling walker       Vision     Perception     Praxis      Pertinent Vitals/Pain Pain Assessment: Faces Faces Pain Scale: Hurts little more Pain Location: R hip Pain Descriptors / Indicators: Operative site guarding Pain Intervention(s): Limited activity within patient's tolerance;Monitored during session;Premedicated before session;Repositioned;Ice applied     Hand Dominance Right   Extremity/Trunk Assessment Upper Extremity Assessment Upper Extremity Assessment: Defer to OT evaluation   Lower Extremity Assessment Lower Extremity Assessment: RLE deficits/detail RLE Deficits / Details: Limited by pain RLE: Unable to fully assess due to pain       Communication Communication Communication: HOH   Cognition Arousal/Alertness: Awake/alert Behavior During Therapy: Anxious Overall Cognitive Status: Within Functional Limits for tasks assessed                     General Comments       Exercises       Shoulder Instructions      Home Living Family/patient expects to be discharged to:: Private residence Living Arrangements: Alone Available Help at Discharge: Skilled Nursing Facility Type of Home: Mobile home Home Access: Stairs to enter Entrance Stairs-Number of Steps: 4 Entrance Stairs-Rails: Right Home Layout: One level  Home Equipment: None          Prior Functioning/Environment Level of Independence: Independent        Comments: doesn't drive    OT Diagnosis: Generalized weakness;Acute pain   OT Problem List: Decreased strength;Decreased  activity tolerance;Impaired balance (sitting and/or standing);Decreased knowledge of use of DME or AE;Pain   OT Treatment/Interventions: Self-care/ADL training;DME and/or AE instruction;Therapeutic activities;Patient/family education;Balance training    OT Goals(Current goals can be found in the care plan section) Acute Rehab OT Goals Patient Stated Goal: walk OT Goal Formulation: With patient Time For Goal Achievement: 02/27/15 Potential to Achieve Goals: Good ADL Goals Pt Will Transfer to Toilet: with mod assist;stand pivot transfer;bedside commode Additional ADL Goal #1: Pt will perform bed mobility with mod assist in preparation for ADL at EOB. Additional ADL Goal #2: Pt will stand without UE support x 30 seconds in preparation for standing ADL.  OT Frequency: Min 2X/week   Barriers to D/C: Decreased caregiver support          Co-evaluation PT/OT/SLP Co-Evaluation/Treatment: Yes Reason for Co-Treatment: For patient/therapist safety PT goals addressed during session: Mobility/safety with mobility;Proper use of DME;Strengthening/ROM OT goals addressed during session: ADL's and self-care      End of Session Equipment Utilized During Treatment: Gait belt;Rolling walker Nurse Communication: Mobility status  Activity Tolerance: Other (comment) (limited by pts anxiety) Patient left: in chair;with call bell/phone within reach   Time: 0850-0922 OT Time Calculation (min): 32 min Charges:  OT General Charges $OT Visit: 1 Procedure OT Evaluation $Initial OT Evaluation Tier I: 1 Procedure G-Codes:    Evern Bio 02/13/2015, 11:39 AM [phone]

## 2015-02-13 NOTE — Clinical Social Work Placement (Signed)
   CLINICAL SOCIAL WORK PLACEMENT  NOTE  Date:  02/13/2015  Patient Details  Name: Alicia Schwartz MRN: [id number] Date of Birth: 05/12/1931  Clinical Social Work is seeking post-discharge placement for this patient at the Skilled  Nursing Facility level of care (*CSW will initial, date and re-position this form in  chart as items are completed):  Yes   Patient/family provided with [facility] Clinical Social Work Department's list of facilities offering this level of care within the geographic area requested by the patient (or if unable, by the patient's family).  Yes   Patient/family informed of their freedom to choose among providers that offer the needed level of care, that participate in Medicare, Medicaid or managed care program needed by the patient, have an available bed and are willing to accept the patient.  Yes   Patient/family informed of [facility]'s ownership interest in [facility] and [facility], as well as of the fact that they are under no obligation to receive care at these facilities.  PASRR submitted to EDS on       PASRR number received on       Existing PASRR number confirmed on       FL2 transmitted to all facilities in geographic area requested by pt/family on 02/13/15     FL2 transmitted to all facilities within larger geographic area on       Patient informed that his/her managed care company has contracts with or will negotiate with certain facilities, including the following:            Patient/family informed of bed offers received.  Patient chooses bed at       Physician recommends and patient chooses bed at      Patient to be transferred to   on  .  Patient to be transferred to facility by       Patient family notified on   of transfer.  Name of family member notified:        PHYSICIAN Please sign FL2     Additional Comment: Unable to submit pasarr at this time secondary to [facility] Must system being down. Weekday CSW to submit  passarr.   _______________________________________________ Loletta Specter A, LCSW 02/13/2015, 1:17 PM

## 2015-02-13 NOTE — Clinical Social Work Note (Signed)
Clinical Social Work Assessment  Patient Details  Name: Alicia Schwartz MRN: [id number] Date of Birth: 1930/11/17  Date of referral:  02/13/15               Reason for consult:  Discharge Planning                Permission sought to share information with:  Family Supports Permission granted to share information::  Yes, Verbal Permission Granted  Name::     Melodee Lupe  Agency::     Relationship::  son  Contact Information:  [phone]  Housing/Transportation Living arrangements for the past 2 months:  [place] of Information:  Adult Children, Patient Patient Interpreter Needed:  None Criminal Activity/Legal Involvement Pertinent to Current Situation/Hospitalization:  No - Comment as needed Significant Relationships:  Adult Children Lives with:  Self Do you feel safe going back to the place where you live?  No Need for family participation in patient care:  Yes (Comment) (pt son at bedside)  Care giving concerns:  Pt admitted from home alone. Pt recommending SNF.   Social Worker assessment / plan:  CSW received referral for New SNF.  CSW met with pt and pt son, Quita Skye at bedside. CSW introduced self and explained role. Pt stated that she lived alone prior to admission. CSW discussed recommendation for SNF for rehab upon discharge and pt agreeable. Pt agreeable to [facility] search. Pt son stated that he lives in [place], but will be in town the next few days. Pt son stated that pt lives close to [facility] in [place] and that is preference for SNF. CSW discussed importance of doing county wide SNF search to have options in case [facility] is unable to offer bed. Pt and pt son agreeable.  CSW completed FL2 and initiated SNF search to [facility].  Weekday CSW to follow up re: SNF bed offers.  CSW to continue to follow to provide support and assist with pt discharge planning needs.   Employment status:  Retired Designer, industrial/product PT Recommendations:  [place] / Referral to community resources:  [place]  Patient/Family's Response to care:  Pt alert and oriented x 4. Pt hard of hearing. Pt son lives out of town, but present to provide support. Pt coping appropriately with plans for rehab at SNF prior to returning home.   Patient/Family's Understanding of and Emotional Response to Diagnosis, Current Treatment, and Prognosis:  Pt and pt son display good understanding of diagnosis and treatment plan. Understand pt will benefit from rehab at [facility] following discharge.  Emotional Assessment Appearance:  Appears stated age Attitude/Demeanor/Rapport:  Other (pt appropriate) Affect (typically observed):  Accepting, Adaptable Orientation:  Oriented to Self, Oriented to Place, Oriented to  Time, Oriented to Situation Alcohol / Substance use:  Not Applicable Psych involvement (Current and /or in the community):  No (Comment)  Discharge Needs  Concerns to be addressed:  Discharge Planning Concerns Readmission within the last 30 days:  No Current discharge risk:  Lives alone Barriers to Discharge:  No Barriers Identified   [place], [place], LCSW 02/13/2015, 1:12 PM  Weekend Coverage [phone]

## 2015-02-13 NOTE — Progress Notes (Signed)
     Subjective:  POD#1 IM nail of R hip. Patient reports pain as mild to moderate.  Sitting up in a recliner currently.  Increased pain with transfers but reports that she is comfortably while seated.  PT recommends rehab at discharge, since the patient lives alone with limited help available.   Objective:   VITALS:   Filed Vitals:   02/12/15 2043 02/13/15 0524 02/13/15 0600 02/13/15 1025  BP: 156/61 198/72 173/51 131/48  Pulse: 80 75 84   Temp: 99.4 F (37.4 C) 97.6 F (36.4 C)    TempSrc: Oral Oral    Resp: 17 17    Height:      Weight:      SpO2: 98% 100%      Neurologically intact ABD soft Neurovascular intact Sensation intact distally Intact pulses distally Dorsiflexion/Plantar flexion intact Incision: scant drainage   Lab Results  Component Value Date   WBC 14.2* 02/13/2015   HGB 11.0* 02/13/2015   HCT 31.6* 02/13/2015   MCV 90.5 02/13/2015   PLT 220 02/13/2015   BMET    Component Value Date/Time   NA 130* 02/13/2015 0639   K 4.8 02/13/2015 0639   CL 100* 02/13/2015 0639   CO2 22 02/13/2015 0639   GLUCOSE 100* 02/13/2015 0639   BUN 11 02/13/2015 0639   CREATININE 1.04* 02/13/2015 0639   CALCIUM 8.2* 02/13/2015 0639   GFRNONAA 49* 02/13/2015 0639   GFRAA 56* 02/13/2015 0639     Assessment/Plan: 1 Day Post-Op   Principal Problem:   Fracture, subtrochanteric, right femur, closed Active Problems:   Anxiety state   HYPERTENSION, BENIGN SYSTEMIC   Fall due to stumbling   Paroxysmal supraventricular tachycardia   Up with therapy WBAT in the RLE Lovenox for DVT prophylaxis   Alicia Schwartz Marie 02/13/2015, 12:15 PM Cell [phone]

## 2015-02-13 NOTE — Progress Notes (Signed)
This patient received 2 units of blood in OR on 7/23. The second unit was never charted "completed" and kept showing that this transfusion was still infusing. This RN charted completion so that the chart will reflect that the blood was done and not transfusing as being shown in the chart.

## 2015-02-13 NOTE — Evaluation (Signed)
Physical Therapy Evaluation Patient Details Name: Alicia Schwartz MRN: [id number] DOB: 01/16/1931 Today's Date: 02/13/2015   History of Present Illness  Pt fell while on a sidewalk resulting in R proximal femur fx requiring IM nail.  Clinical Impression  Pt admitted with above diagnosis. Pt currently with functional limitations due to the deficits listed below (see PT Problem List).  Pt will benefit from skilled PT to increase their independence and safety with mobility to allow discharge to the venue listed below.  Pt lives alone and will benefit from short term SNF rehab to maximize functional mobility prior to going home.       Follow Up Recommendations SNF    Equipment Recommendations  None recommended by PT    Recommendations for Other Services       Precautions / Restrictions Precautions Precautions: Fall Restrictions Other Position/Activity Restrictions: WBAT R LE      Mobility  Bed Mobility Overal bed mobility: +2 for physical assistance;Needs Assistance Bed Mobility: Supine to Sit     Supine to sit: Max assist;+2 for physical assistance     General bed mobility comments: +2 with use of pad for hips and assist to raise trunk, Pt attempting to help with legs initially  Transfers Overall transfer level: Needs assistance Equipment used: Rolling walker (2 wheeled) Transfers: Sit to/from [employer] Transfers Sit to Stand: +2 physical assistance;Max assist Stand pivot transfers: +2 physical assistance;Max assist       General transfer comment: verbal cues and physical assist to sequence walker use  Ambulation/Gait Ambulation/Gait assistance: Max assist Ambulation Distance (Feet): 2 Feet Assistive device: Rolling walker (2 wheeled) Gait Pattern/deviations: Antalgic;Step-to pattern;Decreased stance time - right     General Gait Details: Initiated ambulation from bedside and pt ambulated 1-2 feet before needing to have pt turn and get recliner behind  her due to fatigue.  Stairs            Wheelchair Mobility    Modified Rankin (Stroke Patients Only)       Balance Overall balance assessment: Needs assistance Sitting-balance support: Feet supported Sitting balance-Leahy Scale: Fair       Standing balance-Leahy Scale: Poor Standing balance comment: requires RW                             Pertinent Vitals/Pain Pain Assessment: Faces Faces Pain Scale: Hurts little more Pain Location: R hip Pain Descriptors / Indicators: Operative site guarding Pain Intervention(s): Limited activity within patient's tolerance;Monitored during session;Premedicated before session;Repositioned;Ice applied    Home Living Family/patient expects to be discharged to:: Private residence Living Arrangements: Alone Available Help at Discharge: Skilled Nursing Facility Type of Home: Mobile home Home Access: Stairs to enter Entrance Stairs-Rails: Right Entrance Stairs-Number of Steps: 4 Home Layout: One level Home Equipment: None      Prior Function Level of Independence: Independent         Comments: doesn't drive     Hand Dominance   Dominant Hand: Right    Extremity/Trunk Assessment   Upper Extremity Assessment: Defer to OT evaluation           Lower Extremity Assessment: RLE deficits/detail RLE Deficits / Details: Limited by pain       Communication   Communication: HOH  Cognition Arousal/Alertness: Awake/alert Behavior During Therapy: Anxious Overall Cognitive Status: Within Functional Limits for tasks assessed  General Comments General comments (skin integrity, edema, etc.): Pt apprehensive about working with PT, but pleasant    Exercises General Exercises - Lower Extremity Ankle Circles/Pumps: AROM;Both;5 reps Heel Slides: AAROM;5 reps;Right Hip ABduction/ADduction: AAROM;5 reps;Right      Assessment/Plan    PT Assessment Patient needs continued PT services   PT Diagnosis Difficulty walking;Generalized weakness;Acute pain   PT Problem List Decreased strength;Decreased range of motion;Decreased activity tolerance;Decreased balance;Decreased mobility;Pain;Decreased knowledge of use of DME  PT Treatment Interventions Gait training;Functional mobility training;Therapeutic activities;Therapeutic exercise;DME instruction;Patient/family education   PT Goals (Current goals can be found in the Care Plan section) Acute Rehab PT Goals Patient Stated Goal: walk PT Goal Formulation: With patient Time For Goal Achievement: 02/27/15 Potential to Achieve Goals: Good    Frequency Min 3X/week   Barriers to discharge        Co-evaluation   Reason for Co-Treatment: For patient/therapist safety PT goals addressed during session: Mobility/safety with mobility;Proper use of DME;Strengthening/ROM OT goals addressed during session: ADL's and self-care       End of Session Equipment Utilized During Treatment: Gait belt Activity Tolerance: Patient limited by pain Patient left: in chair;with call bell/phone within reach           Time: 0850-0922 PT Time Calculation (min) (ACUTE ONLY): 32 min   Charges:   PT Evaluation $Initial PT Evaluation Tier I: 1 Procedure     PT G Codes:        Alicia Schwartz 02/13/2015, 11:46 AM

## 2015-02-13 NOTE — Progress Notes (Signed)
[facility] PROGRESS NOTE  Alicia Schwartz LKG:[id number] DOB: 04-08-1931 DOA: 02/11/2015 PCP: No primary care provider on file.  Assessment/Plan:  Principal Problem:   Fracture, subtrochanteric, right femur, closed Active Problems:   Anxiety state   HYPERTENSION, BENIGN SYSTEMIC   Fall due to stumbling   Paroxysmal supraventricular tachycardia  Continue current. SNF placement  HPI/Subjective: Pain is tolerable. Agreeable to SNF. Eating well  Objective: Filed Vitals:   02/13/15 1025  BP: 131/48  Pulse:   Temp:   Resp:     Intake/Output Summary (Last 24 hours) at 02/13/15 1243 Last data filed at 02/13/15 0700  Gross per 24 hour  Intake 978.75 ml  Output   1900 ml  Net -921.25 ml   Filed Weights   02/11/15 2242 02/11/15 2330  Weight: 55.339 kg (122 lb) 52.4 kg (115 lb 8.3 oz)    Exam:   General:  In chair, talkative, a and o  Cardiovascular: RRR  Respiratory: CTA  Abdomen: S, NT, ND  Ext: no CCE  Basic Metabolic Panel:  Recent Labs Lab 02/11/15 1759 02/12/15 0403 02/12/15 0822 02/13/15 0639  NA 134* 132* 134* 130*  K 3.6 4.2 4.2 4.8  CL 101 103  --  100*  CO2 22 23  --  22  GLUCOSE 169* 133* 123* 100*  BUN 14 9  --  11  CREATININE 1.29* 1.25*  --  1.04*  CALCIUM 9.2 8.8*  --  8.2*   Liver Function Tests: No results for input(s): AST, ALT, ALKPHOS, BILITOT, PROT, ALBUMIN in the last 168 hours. No results for input(s): LIPASE, AMYLASE in the last 168 hours. No results for input(s): AMMONIA in the last 168 hours. CBC:  Recent Labs Lab 02/11/15 1759 02/12/15 0403 02/12/15 0822 02/13/15 1100  WBC 10.6* 13.3*  --  14.2*  HGB 11.4* 10.1* 8.8* 11.0*  HCT 32.6* 29.1* 26.0* 31.6*  MCV 98.2 97.0  --  90.5  PLT 328 309  --  220   Cardiac Enzymes: No results for input(s): CKTOTAL, CKMB, CKMBINDEX, TROPONINI in the last 168 hours. BNP (last 3 results) No results for input(s): BNP in the last 8760 hours.  ProBNP (last 3  results) No results for input(s): PROBNP in the last 8760 hours.  CBG: No results for input(s): GLUCAP in the last 168 hours.  Recent Results (from the past 240 hour(s))  Surgical pcr screen     Status: None   Collection Time: 02/12/15  1:41 AM  Result Value Ref Range Status   MRSA, PCR NEGATIVE NEGATIVE Final   Staphylococcus aureus NEGATIVE NEGATIVE Final    Comment:        The Xpert SA Assay (FDA approved for NASAL specimens in patients over 32 years of age), is one component of a comprehensive surveillance program.  Test performance has been validated by  [facility] for patients greater than or equal to 87 year old. It is not intended to diagnose infection nor to guide or monitor treatment.      Studies: Pelvis Portable  02/12/2015   CLINICAL DATA:  Postop ORIF intertrochanteric right femoral neck fracture.  EXAM: PORTABLE PELVIS 1-2 VIEWS  COMPARISON:  Intraoperative examination earlier same day and 02/11/2015.  FINDINGS: ORIF of the intertrochanteric right femoral neck fracture with intramedullary nail and compression screw. Near anatomic alignment in the AP projection. No fractures elsewhere. Sclerotic focus in the inferior right acetabulum unchanged, likely benign bone island. Osseous demineralization.  IMPRESSION: Near anatomic alignment post ORIF of the intertrochanteric  right femoral neck fracture.   Electronically Signed   By: Hulan Saas M.D.   On: 02/12/2015 11:07   Dg Hip Operative Unilat With Pelvis Right  02/12/2015   CLINICAL DATA:  Proximal right femoral fracture  EXAM: OPERATIVE right HIP (WITH PELVIS IF PERFORMED) 4 VIEWS  TECHNIQUE: Fluoroscopic spot image(s) were submitted for interpretation post-operatively.  COMPARISON:  02/11/2015  FLUOROSCOPY TIME: 1 min 31 seconds  FINDINGS: Medullary rod is now seen within the right femur. A fixation screw is noted traversing the femoral neck. Distal fixation screws noted as well. Fracture fragments are in near  anatomic alignment.  IMPRESSION: Status post ORIF of proximal right femoral fracture   Electronically Signed   By: [name] M.D.   On: 02/12/2015 09:35   Dg Hip Unilat With Pelvis 2-3 Views Right  02/11/2015   CLINICAL DATA:  Status post fall today. Severe right hip pain. Initial encounter.  EXAM: DG HIP (WITH OR WITHOUT PELVIS) 2-3V RIGHT  COMPARISON:  None.  FINDINGS: The patient has an intertrochanteric right femur fracture with subtrochanteric extension. The femoral head is located. No other acute abnormality is identified.  IMPRESSION: Intertrochanteric fracture with subtrochanteric extension.   Electronically Signed   By: Drusilla Kanner M.D.   On: 02/11/2015 18:05    Scheduled Meds: . sodium chloride  10 mL/hr Intravenous Once  . docusate sodium  100 mg Oral BID  . enoxaparin (LOVENOX) injection  40 mg Subcutaneous Q24H  . ferrous sulfate  325 mg Oral TID PC  . senna  1 tablet Oral BID   Continuous Infusions: . sodium chloride 75 mL/hr at 02/13/15 1008    Time spent: 15 minutes  Alicia Schwartz L  [facility] [email or web address], password [facility] 02/13/2015, 12:43 PM  LOS: 2 days

## 2015-02-14 ENCOUNTER — Encounter (HOSPITAL_COMMUNITY): Payer: Self-pay | Admitting: Orthopedic Surgery

## 2015-02-14 DIAGNOSIS — D62 Acute posthemorrhagic anemia: Secondary | ICD-10-CM | POA: Diagnosis not present

## 2015-02-14 LAB — CBC
HEMATOCRIT: 31.9 % — AB (ref 36.0–46.0)
Hemoglobin: 11.2 g/dL — ABNORMAL LOW (ref 12.0–15.0)
MCH: 32.4 pg (ref 26.0–34.0)
MCHC: 35.1 g/dL (ref 30.0–36.0)
MCV: 92.2 fL (ref 78.0–100.0)
PLATELETS: 198 10*3/uL (ref 150–400)
RBC: 3.46 MIL/uL — AB (ref 3.87–5.11)
RDW: 15.6 % — AB (ref 11.5–15.5)
WBC: 13.2 10*3/uL — ABNORMAL HIGH (ref 4.0–10.5)

## 2015-02-14 LAB — BASIC METABOLIC PANEL
Anion gap: 6 (ref 5–15)
BUN: 14 mg/dL (ref 6–20)
CO2: 23 mmol/L (ref 22–32)
Calcium: 8.2 mg/dL — ABNORMAL LOW (ref 8.9–10.3)
Chloride: 103 mmol/L (ref 101–111)
Creatinine, Ser: 0.96 mg/dL (ref 0.44–1.00)
GFR, EST NON AFRICAN AMERICAN: 54 mL/min — AB (ref >60)
Glucose, Bld: 97 mg/dL (ref 65–99)
POTASSIUM: 4.2 mmol/L (ref 3.5–5.1)
Sodium: 132 mmol/L — ABNORMAL LOW (ref 135–145)

## 2015-02-14 MED ORDER — POLYETHYLENE GLYCOL 3350 17 G PO PACK
17.0000 g | PACK | Freq: Every day | ORAL | Status: DC
  Administered 2015-02-14 – 2015-02-15 (×2): 17 g via ORAL
  Filled 2015-02-14 (×2): qty 1

## 2015-02-14 MED ORDER — WHITE PETROLATUM GEL
Status: AC
  Administered 2015-02-14: 1
  Filled 2015-02-14: qty 1

## 2015-02-14 NOTE — Progress Notes (Signed)
[facility] PROGRESS NOTE  Alicia Schwartz ZOX:[id number] DOB: 1930-12-27 DOA: 02/11/2015 PCP: No primary care provider on file.  Assessment/Plan:  Principal Problem:   Fracture, subtrochanteric, right femur, closed Active Problems:   Anxiety state   HYPERTENSION, BENIGN SYSTEMIC   Fall due to stumbling   Paroxysmal supraventricular tachycardia   Postoperative anemia due to acute blood loss  Continue current. awating SNF placement  HPI/Subjective: Pain is tolerable. No stool yet  Objective: Filed Vitals:   02/14/15 0424  BP: 171/60  Pulse: 77  Temp: 98 F (36.7 C)  Resp: 17    Intake/Output Summary (Last 24 hours) at 02/14/15 1227 Last data filed at 02/14/15 0840  Gross per 24 hour  Intake    277 ml  Output   1000 ml  Net   -723 ml   Filed Weights   02/11/15 2242 02/11/15 2330  Weight: 55.339 kg (122 lb) 52.4 kg (115 lb 8.3 oz)    Exam:   General:  In chair, talkative, a and o  Cardiovascular: RRR  Respiratory: CTA  Abdomen: S, NT, ND  Ext: no CCE  Basic Metabolic Panel:  Recent Labs Lab 02/11/15 1759 02/12/15 0403 02/12/15 0822 02/13/15 0639 02/14/15 0446  NA 134* 132* 134* 130* 132*  K 3.6 4.2 4.2 4.8 4.2  CL 101 103  --  100* 103  CO2 22 23  --  22 23  GLUCOSE 169* 133* 123* 100* 97  BUN 14 9  --  11 14  CREATININE 1.29* 1.25*  --  1.04* 0.96  CALCIUM 9.2 8.8*  --  8.2* 8.2*   Liver Function Tests: No results for input(s): AST, ALT, ALKPHOS, BILITOT, PROT, ALBUMIN in the last 168 hours. No results for input(s): LIPASE, AMYLASE in the last 168 hours. No results for input(s): AMMONIA in the last 168 hours. CBC:  Recent Labs Lab 02/11/15 1759 02/12/15 0403 02/12/15 0822 02/13/15 1100 02/14/15 0446  WBC 10.6* 13.3*  --  14.2* 13.2*  HGB 11.4* 10.1* 8.8* 11.0* 11.2*  HCT 32.6* 29.1* 26.0* 31.6* 31.9*  MCV 98.2 97.0  --  90.5 92.2  PLT 328 309  --  220 198   Cardiac Enzymes: No results for input(s): CKTOTAL, CKMB,  CKMBINDEX, TROPONINI in the last 168 hours. BNP (last 3 results) No results for input(s): BNP in the last 8760 hours.  ProBNP (last 3 results) No results for input(s): PROBNP in the last 8760 hours.  CBG: No results for input(s): GLUCAP in the last 168 hours.  Recent Results (from the past 240 hour(s))  Surgical pcr screen     Status: None   Collection Time: 02/12/15  1:41 AM  Result Value Ref Range Status   MRSA, PCR NEGATIVE NEGATIVE Final   Staphylococcus aureus NEGATIVE NEGATIVE Final    Comment:        The Xpert SA Assay (FDA approved for NASAL specimens in patients over 50 years of age), is one component of a comprehensive surveillance program.  Test performance has been validated by [facility] for patients greater than or equal to 25 year old. It is not intended to diagnose infection nor to guide or monitor treatment.      Studies: No results found.  Scheduled Meds: . sodium chloride  10 mL/hr Intravenous Once  . docusate sodium  100 mg Oral BID  . enoxaparin (LOVENOX) injection  30 mg Subcutaneous Q24H  . ferrous sulfate  325 mg Oral TID PC  . losartan  25 mg  Oral Daily  . senna  1 tablet Oral BID  . spironolactone  25 mg Oral Daily   Continuous Infusions:    Time spent: 15 minutes  Alicia Schwartz  [facility] [email or web address], password [facility] 02/14/2015, 12:27 PM  LOS: 3 days

## 2015-02-14 NOTE — Progress Notes (Signed)
Physical Therapy Treatment Patient Details Name: Alicia Schwartz MRN: [id number] DOB: 1931/06/04 Today's Date: 02/14/2015    History of Present Illness Pt fell while on a sidewalk resulting in R proximal femur fx requiring IM nail.    PT Comments    Alicia Schwartz was very anxious about falling but was very appreciative at end of session.  Mod +2 assist for bed mobility, sit<>stand and stand pivot.  Pt will benefit from continued skilled PT services to increase functional independence and safety.   Follow Up Recommendations  SNF     Equipment Recommendations  None recommended by PT    Recommendations for Other Services       Precautions / Restrictions Precautions Precautions: Fall Restrictions Weight Bearing Restrictions: Yes RLE Weight Bearing: Weight bearing as tolerated    Mobility  Bed Mobility Overal bed mobility: Needs Assistance;+2 for physical assistance Bed Mobility: Supine to Sit     Supine to sit: Mod assist;+2 for physical assistance;HOB elevated     General bed mobility comments: +2 w/ use of bed pads for hips and supporting trunk posteriorly as pt pulls up into sitting using bed rail and w/ hand held assist  Transfers Overall transfer level: Needs assistance Equipment used: Rolling walker (2 wheeled) Transfers: Sit to/from Alicia Schwartz Sit to Stand: Mod assist;+2 physical assistance Stand pivot transfers: Mod assist;+2 safety/equipment       General transfer comment: VCs for proper technique and hand placement on RW.  Cues to stand upright during stand pivot and cues for management of RW.  Hand over hand for reaching back to armrest on Alicia Schwartz and recliner chair during stand>sit  Ambulation/Gait                 Stairs            Wheelchair Mobility    Alicia Schwartz (Stroke Patients Only)       Balance Overall balance assessment: Needs assistance;History of Falls Sitting-balance support: Bilateral upper  extremity supported;Feet supported Sitting balance-Alicia Schwartz: Fair Sitting balance - Comments: Min guard assist sitting EOB 2/2 pt's anxiety and instability   Standing balance support: Bilateral upper extremity supported;During functional activity Standing balance-Alicia Schwartz: Poor Standing balance comment: relies on RW for support as well as cues for proper management of RW                    Cognition Arousal/Alertness: Awake/alert Behavior During Therapy: Anxious Overall Cognitive Status: No family/caregiver present to determine baseline cognitive functioning (pt reports she has Alzheimer's)                      Exercises General Exercises - Lower Extremity Ankle Circles/Pumps: AROM;Both;15 reps;Supine Quad Sets: AROM;Both;5 reps;Supine Heel Slides: AROM;AAROM;Both;5 reps;Supine Hip ABduction/ADduction: AAROM;Right;5 reps;Supine    General Comments General comments (skin integrity, edema, etc.): Pt anxious about falling but very appreciative at end of session      Pertinent Vitals/Pain Pain Assessment: Faces Faces Pain Schwartz: Hurts even more Pain Location: R hip Pain Descriptors / Indicators: Moaning;Grimacing Pain Intervention(s): Limited activity within patient's tolerance;Monitored during session;Repositioned    Home Living                      Prior Function            PT Goals (current goals can now be found in the care plan section) Acute Rehab PT Goals Patient Stated Goal: none stated PT Goal Formulation:  With patient Time For Goal Achievement: 02/27/15 Potential to Achieve Goals: Good Progress towards PT goals: Progressing toward goals    Frequency  Min 3X/week    PT Plan Current plan remains appropriate    Co-evaluation             End of Session Equipment Utilized During Treatment: Gait belt Activity Tolerance: Patient limited by pain;Other (comment);Patient limited by fatigue (limited by anxiety) Patient left: in  chair;with call bell/phone within reach;with chair alarm set;Other (comment) (w/ tray across pt's lap)     Time: 1191-4782 PT Time Calculation (min) (ACUTE ONLY): 29 min  Charges:  $Therapeutic Exercise: 8-22 mins $Therapeutic Activity: 8-22 mins                    G Codes:      Michail Jewels PT, [place] [phone] Pager: [phone] 02/14/2015, 1:25 PM

## 2015-02-14 NOTE — Care Management Important Message (Signed)
Important Message  Patient Details  Name: Alicia Schwartz MRN: [id number] Date of Birth: 1931/03/14   Medicare Important Message Given:  Yes-second notification given    Kyla Balzarine 02/14/2015, 2:35 PMImportant Message  Patient Details  Name: Alicia Schwartz MRN: [id number] Date of Birth: [birth year]   Medicare Important Message Given:  Yes-second notification given    Kyla Balzarine 02/14/2015, 2:35 PM

## 2015-02-14 NOTE — Progress Notes (Signed)
Utilization review completed.  

## 2015-02-14 NOTE — Progress Notes (Signed)
     Subjective:  Patient reports pain as marked.  Difficulty making progress with physical therapy.  Objective:   VITALS:   Filed Vitals:   02/13/15 1025 02/13/15 1325 02/13/15 2114 02/14/15 0424  BP: 131/48 154/51 149/76 171/60  Pulse:  76 85 77  Temp:  98.2 F (36.8 C) 97.9 F (36.6 C) 98 F (36.7 C)  TempSrc:   Oral Oral  Resp:  Height:      Weight:      SpO2:  98% 100% 98%    Neurologically intact Dorsiflexion/Plantar flexion intact Incision: dressing C/D/I   Lab Results  Component Value Date   WBC 13.2* 02/14/2015   HGB 11.2* 02/14/2015   HCT 31.9* 02/14/2015   MCV 92.2 02/14/2015   PLT 198 02/14/2015   BMET    Component Value Date/Time   NA 132* 02/14/2015 0446   K 4.2 02/14/2015 0446   CL 103 02/14/2015 0446   CO2 23 02/14/2015 0446   GLUCOSE 97 02/14/2015 0446   BUN 14 02/14/2015 0446   CREATININE 0.96 02/14/2015 0446   CALCIUM 8.2* 02/14/2015 0446   GFRNONAA 54* 02/14/2015 0446   GFRAA >60 02/14/2015 0446     Assessment/Plan: 2 Days Post-Op   Principal Problem:   Fracture, subtrochanteric, right femur, closed Active Problems:   Anxiety state   HYPERTENSION, BENIGN SYSTEMIC   Fall due to stumbling   Paroxysmal supraventricular tachycardia   Advance diet Up with therapy Discharge to SNF Her acute blood loss anemia is improved with the blood transfusion.   Jaydah Stahle P 02/14/2015, 7:20 AM   Teryl Lucy, MD Cell [phone]

## 2015-02-15 DIAGNOSIS — F411 Generalized anxiety disorder: Secondary | ICD-10-CM

## 2015-02-15 DIAGNOSIS — S7221XA Displaced subtrochanteric fracture of right femur, initial encounter for closed fracture: Secondary | ICD-10-CM

## 2015-02-15 DIAGNOSIS — I10 Essential (primary) hypertension: Secondary | ICD-10-CM

## 2015-02-15 DIAGNOSIS — D62 Acute posthemorrhagic anemia: Secondary | ICD-10-CM

## 2015-02-15 LAB — CBC
HEMATOCRIT: 33.5 % — AB (ref 36.0–46.0)
Hemoglobin: 11.7 g/dL — ABNORMAL LOW (ref 12.0–15.0)
MCH: 32.7 pg (ref 26.0–34.0)
MCHC: 34.9 g/dL (ref 30.0–36.0)
MCV: 93.6 fL (ref 78.0–100.0)
Platelets: 253 10*3/uL (ref 150–400)
RBC: 3.58 MIL/uL — ABNORMAL LOW (ref 3.87–5.11)
RDW: 15.1 % (ref 11.5–15.5)
WBC: 15.4 10*3/uL — AB (ref 4.0–10.5)

## 2015-02-15 MED ORDER — DOCUSATE SODIUM 100 MG PO CAPS: 100.0000 mg | ORAL_CAPSULE | Freq: Two times a day (BID) | ORAL | Status: DC

## 2015-02-15 MED ORDER — ACETAMINOPHEN 325 MG PO TABS: 650.0000 mg | ORAL_TABLET | Freq: Four times a day (QID) | ORAL | Status: DC | PRN

## 2015-02-15 MED ORDER — ALUM & MAG HYDROXIDE-SIMETH 200-200-20 MG/5ML PO SUSP: 30.0000 mL | ORAL | Status: DC | PRN

## 2015-02-15 MED ORDER — BISACODYL 10 MG RE SUPP: 10.0000 mg | Freq: Every day | RECTAL | Status: DC | PRN

## 2015-02-15 MED ORDER — FERROUS SULFATE 325 (65 FE) MG PO TABS: 325.0000 mg | ORAL_TABLET | Freq: Three times a day (TID) | ORAL | Status: DC

## 2015-02-15 MED ORDER — POLYETHYLENE GLYCOL 3350 17 G PO PACK
17.0000 g | PACK | Freq: Every day | ORAL | Status: DC
Start: 2015-02-15 — End: 2022-02-23

## 2015-02-15 NOTE — Progress Notes (Signed)
Patient ID: Alicia Schwartz, female   DOB: [birth year], 81 y.o.   MRN: [id number]     Subjective:  Patient reports pain as mild to moderate.  Patient states that she is better today and is sitting up in bed and in no acute distress.  Denies any CP or SOB  Objective:   VITALS:   Filed Vitals:   02/14/15 0424 02/14/15 1300 02/14/15 2004 02/15/15 0535  BP: 171/60 134/54 187/59 176/69  Pulse: 77 80 83 84  Temp: 98 F (36.7 C) 98.1 F (36.7 C) 98.6 F (37 C) 98.5 F (36.9 C)  TempSrc: Oral Oral    Resp: Height:      Weight:      SpO2: 98% 98% 97% 100%    ABD soft Sensation intact distally Dorsiflexion/Plantar flexion intact Incision: dressing C/D/I and no drainage Good foot and ankle motion  Lab Results  Component Value Date   WBC 15.4* 02/15/2015   HGB 11.7* 02/15/2015   HCT 33.5* 02/15/2015   MCV 93.6 02/15/2015   PLT 253 02/15/2015   BMET    Component Value Date/Time   NA 132* 02/14/2015 0446   K 4.2 02/14/2015 0446   CL 103 02/14/2015 0446   CO2 23 02/14/2015 0446   GLUCOSE 97 02/14/2015 0446   BUN 14 02/14/2015 0446   CREATININE 0.96 02/14/2015 0446   CALCIUM 8.2* 02/14/2015 0446   GFRNONAA 54* 02/14/2015 0446   GFRAA >60 02/14/2015 0446     Assessment/Plan: 3 Days Post-Op   Principal Problem:   Fracture, subtrochanteric, right femur, closed Active Problems:   Anxiety state   HYPERTENSION, BENIGN SYSTEMIC   Fall due to stumbling   Paroxysmal supraventricular tachycardia   Postoperative anemia due to acute blood loss   Advance diet Up with therapy Continue plan per medicine WBAT Dry dressing PRN   Haskel Khan 02/15/2015, 10:47 AM  Discussed and agree with above.   Teryl Lucy, MD Cell [phone]

## 2015-02-15 NOTE — Progress Notes (Signed)
Occupational Therapy Treatment Patient Details Name: Alicia Schwartz MRN: [id number] DOB: 11-Dec-1930 Today's Date: 02/15/2015    History of present illness Pt fell while on a sidewalk resulting in R proximal femur fx requiring IM nail.   OT comments  Patient making slow progress towards goals, continue plan of care for now. Engaged patient in sit<>stands (X2) in order to help increase independence with ADLs, functional mobility, and functional transfers.   Follow Up Recommendations  SNF;Supervision/Assistance - 24 hour    Equipment Recommendations  Other (comment) (TBD next venue of care)    Recommendations for Other Services  None at this time   Precautions / Restrictions Precautions Precautions: Fall Restrictions Weight Bearing Restrictions: Yes RLE Weight Bearing: Weight bearing as tolerated    Mobility Bed Mobility General bed mobility comments: Pt found seated in recliner  Transfers Overall transfer level: Needs assistance Equipment used: Rolling walker (2 wheeled) Transfers: Sit to/from Stand Sit to Stand: Mod assist General transfer comment: Mod assist to lift/lower. Mod verbal cues for hand placement, sequencing, and technique during sit<>stand transfers.     Balance Overall balance assessment: Needs assistance;History of Falls Sitting-balance support: Bilateral upper extremity supported;Feet supported Sitting balance-Leahy Scale: Fair     Standing balance support: Bilateral upper extremity supported Standing balance-Leahy Scale: Poor   ADL Overall ADL's : Needs assistance/impaired General ADL Comments: Pt unable to perform LB ADLs due to immobility and increased pain. Pt performed sit<>stands X2 in prep for ADLs and toilet transfers.      Cognition   Behavior During Therapy: Anxious Overall Cognitive Status: Within Functional Limits for tasks assessed (according to PT's note, pt reports she has Alzheimer's. Pt WFL for OT session; however pt with  decreased short term memory) Memory: Decreased short-term memory                  Pertinent Vitals/ Pain       Pain Assessment: Faces Faces Pain Scale: Hurts whole lot Pain Location: right hip with movement and mobility Pain Descriptors / Indicators: Grimacing;Moaning Pain Intervention(s): Limited activity within patient's tolerance;Monitored during session;Repositioned   Frequency Min 2X/week     Progress Toward Goals  OT Goals(current goals can now befound in the care plan section)  Progress towards OT goals: Progressing toward goals     Plan Discharge plan remains appropriate    End of Session Equipment Utilized During Treatment: Gait belt;Rolling walker   Activity Tolerance Patient tolerated treatment well   Patient Left in chair;with call bell/phone within reach;with chair alarm set    Time: 1347-1400 OT Time Calculation (min): 13 min  Charges: OT General Charges $OT Visit: 1 Procedure OT Treatments $Self Care/Home Management : 8-22 mins  Jr Milliron , MS, OTR/L, CLT Pager: [phone]  02/15/2015, 2:16 PM

## 2015-02-15 NOTE — Discharge Summary (Signed)
Physician Discharge Summary  Alicia Schwartz WUJ:[id number] DOB: Sep 02, 1930 DOA: 02/11/2015  PCP: No primary care provider on file.  Admit date: 02/11/2015 Discharge date: 02/15/2015  Time spent: 35 minutes  Recommendations for Outpatient Follow-up:  1. Cbc, bmp 1 week  Discharge Diagnoses:  Principal Problem:   Fracture, subtrochanteric, right femur, closed Active Problems:   Anxiety state   HYPERTENSION, BENIGN SYSTEMIC   Fall due to stumbling   Paroxysmal supraventricular tachycardia   Postoperative anemia due to acute blood loss   Discharge Condition: improved  Diet recommendation: regular  Filed Weights   02/11/15 2242 02/11/15 2330  Weight: 55.339 kg (122 lb) 52.4 kg (115 lb 8.3 oz)    History of present illness:  Alicia Schwartz is a 81 y.o. female with a history of HTN, who presents to the ED with complaints of 9/10 right hip pain after falling. She reports that she was walking on uneven pavement and stumbled and fell onto her right hip. She was brought to the ED via EMS, and in the ED was found on X-ray to have an Intertrochanteric fracture with Subtrochanteric extension of the Right Hip  Hospital Course:  Fracture, subtrochanteric, right femur, closed S/p repair  Anxiety state  HYPERTENSION, BENIGN SYSTEMIC  Fall due to stumbling  Paroxysmal supraventricular tachycardia  Postoperative anemia due to acute blood loss-s/p transfusion  SNF placement  Procedures: INTRAMEDULLARY (IM) NAIL INTERTROCHANTRIC  Consultations:  ortho  Discharge Exam: Filed Vitals:   02/15/15 0535  BP: 176/69  Pulse: 84  Temp: 98.5 F (36.9 C)  Resp: 16    General: pleasant/cooperative Cardiovascular: rrr Respiratory: clear  Discharge Instructions   Discharge Instructions    Diet general    Complete by:  As directed      Increase activity slowly    Complete by:  As directed      Weight bearing as tolerated    Complete by:  As directed            Current Discharge Medication List    START taking these medications   Details  acetaminophen (TYLENOL) 325 MG tablet Take 2 tablets (650 mg total) by mouth every 6 (six) hours as needed for mild pain (or Fever >/= 101).    alum & mag hydroxide-simeth (MAALOX/MYLANTA) 200-200-20 MG/5ML suspension Take 30 mLs by mouth every 4 (four) hours as needed for indigestion. Qty: 355 mL, Refills: 0    baclofen (LIORESAL) 10 MG tablet Take 1 tablet (10 mg total) by mouth 3 (three) times daily. As needed for muscle spasm Qty: 50 tablet, Refills: 0    bisacodyl (DULCOLAX) 10 MG suppository Place 1 suppository (10 mg total) rectally daily as needed for moderate constipation. Qty: 12 suppository, Refills: 0    docusate sodium (COLACE) 100 MG capsule Take 1 capsule (100 mg total) by mouth 2 (two) times daily. Qty: 10 capsule, Refills: 0    enoxaparin (LOVENOX) 40 MG/0.4ML injection Inject 0.4 mLs (40 mg total) into the skin daily. Qty: 21 Syringe, Refills: 0    ferrous sulfate 325 (65 FE) MG tablet Take 1 tablet (325 mg total) by mouth 3 (three) times daily after meals. Refills: 3    HYDROcodone-acetaminophen (NORCO) 5-325 MG per tablet Take 1-2 tablets by mouth every 6 (six) hours as needed for moderate pain. MAXIMUM TOTAL ACETAMINOPHEN DOSE IS 4000 MG PER DAY Qty: 50 tablet, Refills: 0    polyethylene glycol (MIRALAX / GLYCOLAX) packet Take 17 g by mouth daily. Qty: 14 each, Refills:  0    sennosides-docusate sodium (SENOKOT-S) 8.6-50 MG tablet Take 2 tablets by mouth daily. Qty: 30 tablet, Refills: 1      CONTINUE these medications which have NOT CHANGED   Details  losartan (COZAAR) 25 MG tablet Take 1 tablet by mouth daily.    Multiple Vitamins-Minerals (MULTIVITAMIN GUMMIES ADULT PO) Take 1 capsule by mouth daily.    spironolactone (ALDACTONE) 25 MG tablet Take 1 tablet by mouth daily.       Not on File Follow-up Information    Follow up with Eulas Post, MD. Schedule an  appointment as soon as possible for a visit in 2 weeks.   Specialty:  Orthopedic Surgery   Contact information:   [address] [place] [address] [phone]       Please follow up.   Why:  PCP 1 week-- bmp, cbc then       The results of significant diagnostics from this hospitalization (including imaging, microbiology, ancillary and laboratory) are listed below for reference.    Significant Diagnostic Studies: Pelvis Portable  02/12/2015   CLINICAL DATA:  Postop ORIF intertrochanteric right femoral neck fracture.  EXAM: PORTABLE PELVIS 1-2 VIEWS  COMPARISON:  Intraoperative examination earlier same day and 02/11/2015.  FINDINGS: ORIF of the intertrochanteric right femoral neck fracture with intramedullary nail and compression screw. Near anatomic alignment in the AP projection. No fractures elsewhere. Sclerotic focus in the inferior right acetabulum unchanged, likely benign bone island. Osseous demineralization.  IMPRESSION: Near anatomic alignment post ORIF of the intertrochanteric right femoral neck fracture.   Electronically Signed   By: Hulan Saas M.D.   On: 02/12/2015 11:07   Dg Hip Operative Unilat With Pelvis Right  02/12/2015   CLINICAL DATA:  Proximal right femoral fracture  EXAM: OPERATIVE right HIP (WITH PELVIS IF PERFORMED) 4 VIEWS  TECHNIQUE: Fluoroscopic spot image(s) were submitted for interpretation post-operatively.  COMPARISON:  02/11/2015  FLUOROSCOPY TIME: 1 min 31 seconds  FINDINGS: Medullary rod is now seen within the right femur. A fixation screw is noted traversing the femoral neck. Distal fixation screws noted as well. Fracture fragments are in near anatomic alignment.  IMPRESSION: Status post ORIF of proximal right femoral fracture   Electronically Signed   By: [name] M.D.   On: 02/12/2015 09:35   Dg Hip Unilat With Pelvis 2-3 Views Right  02/11/2015   CLINICAL DATA:  Status post fall today. Severe right hip pain. Initial encounter.   EXAM: DG HIP (WITH OR WITHOUT PELVIS) 2-3V RIGHT  COMPARISON:  None.  FINDINGS: The patient has an intertrochanteric right femur fracture with subtrochanteric extension. The femoral head is located. No other acute abnormality is identified.  IMPRESSION: Intertrochanteric fracture with subtrochanteric extension.   Electronically Signed   By: Drusilla Kanner M.D.   On: 02/11/2015 18:05    Microbiology: Recent Results (from the past 240 hour(s))  Surgical pcr screen     Status: None   Collection Time: 02/12/15  1:41 AM  Result Value Ref Range Status   MRSA, PCR NEGATIVE NEGATIVE Final   Staphylococcus aureus NEGATIVE NEGATIVE Final    Comment:        The Xpert SA Assay (FDA approved for NASAL specimens in patients over 40 years of age), is one component of a comprehensive surveillance program.  Test performance has been validated by [facility] for patients greater than or equal to 35 year old. It is not intended to diagnose infection nor to guide or monitor  treatment.      Labs: Basic Metabolic Panel:  Recent Labs Lab 02/11/15 1759 02/12/15 0403 02/12/15 1610 02/13/15 0639 02/14/15 0446  NA 134* 132* 134* 130* 132*  K 3.6 4.2 4.2 4.8 4.2  CL 101 103  --  100* 103  CO2 22 23  --  22 23  GLUCOSE 169* 133* 123* 100* 97  BUN 14 9  --  11 14  CREATININE 1.29* 1.25*  --  1.04* 0.96  CALCIUM 9.2 8.8*  --  8.2* 8.2*   Liver Function Tests: No results for input(s): AST, ALT, ALKPHOS, BILITOT, PROT, ALBUMIN in the last 168 hours. No results for input(s): LIPASE, AMYLASE in the last 168 hours. No results for input(s): AMMONIA in the last 168 hours. CBC:  Recent Labs Lab 02/11/15 1759 02/12/15 0403 02/12/15 0822 02/13/15 1100 02/14/15 0446 02/15/15 0528  WBC 10.6* 13.3*  --  14.2* 13.2* 15.4*  HGB 11.4* 10.1* 8.8* 11.0* 11.2* 11.7*  HCT 32.6* 29.1* 26.0* 31.6* 31.9* 33.5*  MCV 98.2 97.0  --  90.5 92.2 93.6  PLT 328 309  --  220 198 253   Cardiac Enzymes: No  results for input(s): CKTOTAL, CKMB, CKMBINDEX, TROPONINI in the last 168 hours. BNP: BNP (last 3 results) No results for input(s): BNP in the last 8760 hours.  ProBNP (last 3 results) No results for input(s): PROBNP in the last 8760 hours.  CBG: No results for input(s): GLUCAP in the last 168 hours.     SignedMarlin Canary  [facility] 02/15/2015, 8:59 AM

## 2015-02-16 NOTE — Progress Notes (Signed)
Await insurance approval for SNF.  No overnight events.  D/c summary done 7/26 Alicia Schwartz

## 2015-02-16 NOTE — Discharge Planning (Signed)
Patient to be discharged to [facility]. Patient's family updated at bedside.  Facility: [facility] RN report number: [phone] Transportation: EMS ([address]  Marcelline Deist, [place] - [phone] Clinical Social Work Department Orthopedics [phone]) and Surgical [phone])

## 2015-02-16 NOTE — Clinical Social Work Placement (Signed)
   CLINICAL SOCIAL WORK PLACEMENT  NOTE  Date:  02/16/2015  Patient Details  Name: Alicia Schwartz MRN: [id number] Date of Birth: Apr 29, 1931  Clinical Social Work is seeking post-discharge placement for this patient at the Skilled  Nursing Facility level of care (*CSW will initial, date and re-position this form in  chart as items are completed):  Yes   Patient/family provided with [facility] Department's list of facilities offering this level of care within the geographic area requested by the patient (or if unable, by the patient's family).  Yes   Patient/family informed of their freedom to choose among providers that offer the needed level of care, that participate in Medicare, Medicaid or managed care program needed by the patient, have an available bed and are willing to accept the patient.  Yes   Patient/family informed of [facility]'s ownership interest in [facility] and [facility], as well as of the fact that they are under no obligation to receive care at these facilities.  PASRR submitted to EDS on 02/15/15     PASRR number received on 02/15/15     Existing PASRR number confirmed on       FL2 transmitted to all facilities in geographic area requested by pt/family on 02/13/15     FL2 transmitted to all facilities within larger geographic area on  (n/a)     Patient informed that his/her managed care company has contracts with or will negotiate with certain facilities, including the following:   (yes, [facility])     Yes   Patient/family informed of bed offers received.  Patient chooses bed at [facility]     Physician recommends and patient chooses bed at  (n/a)    Patient to be transferred to [facility] on 02/16/15.  Patient to be transferred to facility by [facility]     Patient family notified on 02/16/15 of transfer.  Name of family member notified:  Patient, patient's niece, and patient's sister updated at  bedside.     PHYSICIAN       Additional Comment:    _______________________________________________ Rod Mae, LCSW 02/16/2015, 12:29 PM [phone]

## 2015-02-16 NOTE — Progress Notes (Signed)
Patient ID: Alicia Schwartz, female   DOB: 17-Feb-1931, 81 y.o.   MRN: [id number]     Subjective:  Patient reports pain as mild to moderate.  Patient having some increased pain throughout the night. She denies any CP or SOB  Objective:   VITALS:   Filed Vitals:   02/15/15 0535 02/15/15 1507 02/15/15 2020 02/16/15 0448  BP: 176/69 144/59 144/55 147/57  Pulse: 84 97 91 72  Temp: 98.5 F (36.9 C) 99 F (37.2 C) 98.8 F (37.1 C) 98.5 F (36.9 C)  TempSrc:  Oral Oral Oral  Resp: Height:      Weight:      SpO2: 100% 99% 98% 98%    ABD soft Sensation intact distally Dorsiflexion/Plantar flexion intact Incision: dressing C/D/I and no drainage Good foot and ankle motion  Lab Results  Component Value Date   WBC 15.4* 02/15/2015   HGB 11.7* 02/15/2015   HCT 33.5* 02/15/2015   MCV 93.6 02/15/2015   PLT 253 02/15/2015   BMET    Component Value Date/Time   NA 132* 02/14/2015 0446   K 4.2 02/14/2015 0446   CL 103 02/14/2015 0446   CO2 23 02/14/2015 0446   GLUCOSE 97 02/14/2015 0446   BUN 14 02/14/2015 0446   CREATININE 0.96 02/14/2015 0446   CALCIUM 8.2* 02/14/2015 0446   GFRNONAA 54* 02/14/2015 0446   GFRAA >60 02/14/2015 0446     Assessment/Plan: 4 Days Post-Op   Principal Problem:   Fracture, subtrochanteric, right femur, closed Active Problems:   Anxiety state   HYPERTENSION, BENIGN SYSTEMIC   Fall due to stumbling   Paroxysmal supraventricular tachycardia   Postoperative anemia due to acute blood loss   Advance diet Up with therapy WBAT Dry dressing PRN Continue plan per medicine    Haskel Khan 02/16/2015, 7:36 AM  Seen and agree with above.  DC per primary team.  Ready from our standpoint.  Teryl Lucy, MD Cell [phone]

## 2015-02-16 NOTE — Discharge Planning (Signed)
Report called to [facility]

## 2015-02-16 NOTE — Progress Notes (Signed)
Physical Therapy Treatment Patient Details Name: Alicia Schwartz MRN: [id number] DOB: 1931-02-18 Today's Date: 02/16/2015    History of Present Illness Pt fell while on a sidewalk resulting in R proximal femur fx requiring IM nail.    PT Comments    Alicia Schwartz continues to experience anxiety 2/2 fear of falling and requires max verbal cues for technique w/ ambulation and sit<>stand.  Mod assist for all mobility and +2 assist for safety/equipment management.  Pt will benefit from continued skilled PT services to increase functional independence and safety.   Follow Up Recommendations  SNF     Equipment Recommendations  None recommended by PT    Recommendations for Other Services       Precautions / Restrictions Precautions Precautions: Fall Restrictions Weight Bearing Restrictions: Yes RLE Weight Bearing: Weight bearing as tolerated    Mobility  Bed Mobility Overal bed mobility: Needs Assistance;+2 for physical assistance Bed Mobility: Supine to Sit     Supine to sit: Mod assist;HOB elevated     General bed mobility comments: Mod assist w/ use of bed pad and supporting trunk to assist w/ pt pushing up to sitting.  Increased time.  Transfers Overall transfer level: Needs assistance Equipment used: Rolling walker (2 wheeled) Transfers: Sit to/from [employer] Sit to Stand: Mod assist;+2 physical assistance;+2 safety/equipment Stand pivot transfers: Mod assist;+2 safety/equipment       General transfer comment: VCs for proper technique and hand placement on RW.  Cues to stand upright during stand pivot and assist w/ management of RW.  Hand over hand for reaching back to armrest on [facility] and recliner chair during stand>sit  Ambulation/Gait Ambulation/Gait assistance: Mod assist;+2 safety/equipment Ambulation Distance (Feet): 5 Feet Assistive device: Rolling walker (2 wheeled) Gait Pattern/deviations: Step-to pattern;Antalgic;Trunk  flexed;Shuffle;Decreased weight shift to right   Gait velocity interpretation: Below normal speed for age/gender General Gait Details: Pt requires max VCs to stand upright during ambulation and VCs provided to pick up L foot rather than having it shuffle 2/2 dec weight shift to the RLE.  Assist w/ managing RW and close chair follow.   Stairs            Wheelchair Mobility    Modified Rankin (Stroke Patients Only)       Balance Overall balance assessment: Needs assistance;History of Falls Sitting-balance support: Bilateral upper extremity supported;Feet supported Sitting balance-Leahy Scale: Fair Sitting balance - Comments: Min guard assist sitting EOB 2/2 pt's anxiety and instability   Standing balance support: Bilateral upper extremity supported;During functional activity Standing balance-Leahy Scale: Poor                      Cognition Arousal/Alertness: Awake/alert Behavior During Therapy: Anxious Overall Cognitive Status: Within Functional Limits for tasks assessed (during last session pt reported she has Alzheimer's)       Memory: Decreased short-term memory              Exercises General Exercises - Lower Extremity Heel Slides: AROM;AAROM;Both;Supine;10 reps Hip ABduction/ADduction: AAROM;Right;5 reps;Supine;Limitations Hip Abduction/Adduction Limitations: muscle guarding despit cues to relax and assist w/ motion    General Comments        Pertinent Vitals/Pain Pain Assessment: Faces Faces Pain Scale: Hurts even more Pain Location: R hip Pain Descriptors / Indicators: Discomfort;Grimacing;Guarding;Moaning Pain Intervention(s): Limited activity within patient's tolerance;Monitored during session;Repositioned    Home Living  Prior Function            PT Goals (current goals can now be found in the care plan section) Acute Rehab PT Goals Patient Stated Goal: none stated PT Goal Formulation: With  patient Time For Goal Achievement: 02/27/15 Potential to Achieve Goals: Good Progress towards PT goals: Progressing toward goals    Frequency  Min 3X/week    PT Plan Current plan remains appropriate    Co-evaluation             End of Session Equipment Utilized During Treatment: Gait belt Activity Tolerance: Patient limited by pain;Other (comment);Patient limited by fatigue (limited by anxiety) Patient left: in chair;with call bell/phone within reach;with chair alarm set;Other (comment);with family/visitor present (w/ tray across pt's lap)     Time: 7829-5621 PT Time Calculation (min) (ACUTE ONLY): 24 min  Charges:  $Gait Training: 8-22 mins $Therapeutic Activity: 8-22 mins                    G CodesMichail Jewels PT, [place] [phone] Pager: [phone] 02/16/2015, 1:25 PM

## 2019-06-28 ENCOUNTER — Emergency Department (HOSPITAL_COMMUNITY)
Admission: EM | Admit: 2019-06-28 | Discharge: 2019-06-28 | Disposition: A | Discharge status: Home / Self Care | Payer: Medicare Other | Attending: Emergency Medicine | Admitting: Emergency Medicine

## 2019-06-28 ENCOUNTER — Emergency Department (HOSPITAL_COMMUNITY): Payer: Medicare Other

## 2019-06-28 ENCOUNTER — Other Ambulatory Visit: Payer: Self-pay

## 2019-06-28 ENCOUNTER — Encounter (HOSPITAL_COMMUNITY): Payer: Self-pay

## 2019-06-28 DIAGNOSIS — X500XXA Overexertion from strenuous movement or load, initial encounter: Secondary | ICD-10-CM | POA: Diagnosis not present

## 2019-06-28 DIAGNOSIS — Y9389 Activity, other specified: Secondary | ICD-10-CM | POA: Diagnosis not present

## 2019-06-28 DIAGNOSIS — Y92010 Kitchen of single-family (private) house as the place of occurrence of the external cause: Secondary | ICD-10-CM | POA: Insufficient documentation

## 2019-06-28 DIAGNOSIS — I10 Essential (primary) hypertension: Secondary | ICD-10-CM | POA: Diagnosis not present

## 2019-06-28 DIAGNOSIS — Z79899 Other long term (current) drug therapy: Secondary | ICD-10-CM | POA: Diagnosis not present

## 2019-06-28 DIAGNOSIS — Y999 Unspecified external cause status: Secondary | ICD-10-CM | POA: Diagnosis not present

## 2019-06-28 DIAGNOSIS — S3992XA Unspecified injury of lower back, initial encounter: Secondary | ICD-10-CM | POA: Diagnosis present

## 2019-06-28 DIAGNOSIS — S22080A Wedge compression fracture of T11-T12 vertebra, initial encounter for closed fracture: Secondary | ICD-10-CM | POA: Insufficient documentation

## 2019-06-28 MED ORDER — HYDROCODONE-ACETAMINOPHEN 5-325 MG PO TABS
1.0000 | ORAL_TABLET | Freq: Once | ORAL | Status: AC
  Administered 2019-06-28: 1 via ORAL
  Filled 2019-06-28: qty 1

## 2019-06-28 MED ORDER — HYDROCODONE-ACETAMINOPHEN 5-325 MG PO TABS: 1.0000 | ORAL_TABLET | Freq: Four times a day (QID) | ORAL | 0 refills | Status: DC | PRN

## 2019-06-28 NOTE — ED Triage Notes (Signed)
Pt presents with c/o back pain. Pt reports that she was lifting up the end of the table and when she did, she heard a pop in her back. Family reports they were able to have her walk down the stairs after it happened with assistance.

## 2019-06-28 NOTE — Discharge Instructions (Signed)
He was seen in the emergency department for back pain after lifting a table.  Your CAT scan showed a compression fracture of your #12 thoracic vertebra.  Neurosurgery is recommending a brace to use for comfort.  We are prescribing you some pain medication.  This may make you constipated or nauseous or lightheaded so please be use caution.  Follow-up with Dr. Arnoldo Morale within the next 2 weeks.  Return if any new numbness or weakness or other concerning symptoms.

## 2019-06-28 NOTE — ED Provider Notes (Signed)
[facility] DEPT Provider Note   CSN: [id number] Arrival date & time: 06/28/19  0910     History   Chief Complaint Chief Complaint  Patient presents with  . Back Pain    HPI Alicia Schwartz is a 85 y.o. female.  She is complaining of acute onset of lumbar back pain after lifting at the end of the kitchen table this morning.  She said there was acute pop and then she had pain.  No numbness or weakness.  No bowel or bladder incontinence.  She has been ambulatory since this happened few hours ago.     The history is provided by the patient.  Back Pain Location:  Lumbar spine Quality:  Stabbing Radiates to:  Does not radiate Pain severity:  Moderate Pain is:  Same all the time Onset quality:  Sudden Timing:  Constant Progression:  Unchanged Chronicity:  New Context: lifting heavy objects   Relieved by:  None tried Worsened by:  Movement Ineffective treatments:  None tried Associated symptoms: no abdominal pain, no bladder incontinence, no bowel incontinence, no chest pain, no dysuria, no fever, no numbness and no weakness     Past Medical History:  Diagnosis Date  . Fracture, subtrochanteric, right femur, closed ([place]) 02/11/2015  . Hypertension   . Paroxysmal supraventricular tachycardia ([place]) 02/12/2015    Patient Active Problem List   Diagnosis Date Noted  . Postoperative anemia due to acute blood loss 02/14/2015  . Paroxysmal supraventricular tachycardia ([place]) 02/12/2015  . Fracture, subtrochanteric, right femur, closed ([place]) 02/11/2015  . Fall due to stumbling 02/11/2015  . Anxiety state 09/19/2006  . HYPERTENSION, BENIGN SYSTEMIC 09/19/2006    Past Surgical History:  Procedure Laterality Date  . ABDOMINAL HYSTERECTOMY    . INTRAMEDULLARY (IM) NAIL INTERTROCHANTERIC Right 02/12/2015   Procedure: INTRAMEDULLARY (IM) NAIL INTERTROCHANTRIC;  Surgeon: Marchia Bond, MD;  Location: [facility];  Service: Orthopedics;  Laterality: Right;      OB History   No obstetric history on file.      Home Medications    Prior to Admission medications   Medication Sig Start Date End Date Taking? Authorizing Provider  acetaminophen (TYLENOL) 325 MG tablet Take 2 tablets (650 mg total) by mouth every 6 (six) hours as needed for mild pain (or Fever >/= 101). 02/15/15   Eulogio Bear U, DO  alum & mag hydroxide-simeth (MAALOX/MYLANTA) 200-200-20 MG/5ML suspension Take 30 mLs by mouth every 4 (four) hours as needed for indigestion. 02/15/15   Geradine Girt, DO  baclofen (LIORESAL) 10 MG tablet Take 1 tablet (10 mg total) by mouth 3 (three) times daily. As needed for muscle spasm 02/12/15   Marchia Bond, MD  bisacodyl (DULCOLAX) 10 MG suppository Place 1 suppository (10 mg total) rectally daily as needed for moderate constipation. 02/15/15   Geradine Girt, DO  docusate sodium (COLACE) 100 MG capsule Take 1 capsule (100 mg total) by mouth 2 (two) times daily. 02/15/15   Geradine Girt, DO  enoxaparin (LOVENOX) 40 MG/0.4ML injection Inject 0.4 mLs (40 mg total) into the skin daily. 02/12/15   Marchia Bond, MD  ferrous sulfate 325 (65 FE) MG tablet Take 1 tablet (325 mg total) by mouth 3 (three) times daily after meals. 02/15/15   Geradine Girt, DO  HYDROcodone-acetaminophen (NORCO) 5-325 MG per tablet Take 1-2 tablets by mouth every 6 (six) hours as needed for moderate pain. MAXIMUM TOTAL ACETAMINOPHEN DOSE IS 4000 MG PER DAY 02/12/15   Marchia Bond,  MD  losartan (COZAAR) 25 MG tablet Take 1 tablet by mouth daily. 01/14/15   [provider]  Multiple Vitamins-Minerals (MULTIVITAMIN GUMMIES ADULT PO) Take 1 capsule by mouth daily.    [provider]  polyethylene glycol (MIRALAX / GLYCOLAX) packet Take 17 g by mouth daily. 02/15/15   Joseph Art, DO  sennosides-docusate sodium (SENOKOT-S) 8.6-50 MG tablet Take 2 tablets by mouth daily. 02/12/15   Teryl Lucy, MD  spironolactone (ALDACTONE) 25 MG tablet Take 1 tablet by  mouth daily. 01/22/15   [provider]    Family History Family History  Problem Relation Age of Onset  . Alzheimer's disease Mother   . Prostate cancer Father     Social History Social History   Tobacco Use  . Smoking status: Never Smoker  . Smokeless tobacco: Never Used  Substance Use Topics  . Alcohol use: No  . Drug use: Not on file     Allergies   Patient has no known allergies.   Review of Systems Review of Systems  Constitutional: Negative for fever.  HENT: Negative for sore throat.   Eyes: Negative for visual disturbance.  Respiratory: Negative for shortness of breath.   Cardiovascular: Negative for chest pain.  Gastrointestinal: Negative for abdominal pain and bowel incontinence.  Genitourinary: Negative for bladder incontinence and dysuria.  Musculoskeletal: Positive for back pain.  Skin: Negative for rash.  Neurological: Negative for weakness and numbness.     Physical Exam Updated Vital Signs BP (!) 189/67   Pulse 76   Temp 98.3 F (36.8 C) (Oral)   Resp 16   SpO2 91%   Physical Exam Vitals signs and nursing note reviewed.  Constitutional:      General: She is not in acute distress.    Appearance: She is well-developed.  HENT:     Head: Normocephalic and atraumatic.  Eyes:     Conjunctiva/sclera: Conjunctivae normal.  Neck:     Musculoskeletal: Neck supple.  Cardiovascular:     Rate and Rhythm: Normal rate and regular rhythm.     Heart sounds: No murmur.  Pulmonary:     Effort: Pulmonary effort is normal. No respiratory distress.     Breath sounds: Normal breath sounds.  Abdominal:     Palpations: Abdomen is soft.     Tenderness: There is no abdominal tenderness.  Musculoskeletal:        General: Tenderness present.     Comments: She has some tenderness midline lumbar spine.  She has kyphosis.  Skin:    General: Skin is warm and dry.     Capillary Refill: Capillary refill takes less than 2 seconds.  Neurological:      General: No focal deficit present.     Mental Status: She is alert.     Sensory: No sensory deficit.     Motor: No weakness.      ED Treatments / Results  Labs (all labs ordered are listed, but only abnormal results are displayed) Labs Reviewed - No data to display  EKG None  Radiology Ct Lumbar Spine Wo Contrast  Result Date: 06/28/2019 CLINICAL DATA:  Back pain. Lifting injury. Patient felt sudden onset of pain. EXAM: CT LUMBAR SPINE WITHOUT CONTRAST TECHNIQUE: Multidetector CT imaging of the lumbar spine was performed without intravenous contrast administration. Multiplanar CT image reconstructions were also generated. COMPARISON:  None. FINDINGS: Segmentation: 5 lumbar type vertebral bodies assumed. Alignment: Thoracolumbar kyphosis. Increased lumbar lordosis. 3 mm degenerative anterolisthesis L4-5. Vertebrae: Acute appearing superior  endplate fracture at T12 with loss of height of 1/3. No retropulsed bone. Old appearing superior endplate fracture of L1, inferior endplate fracture of L2 and generalized vertebral body fracture at L5. Those all look old and healed. Paraspinal and other soft tissues: Negative other than aortic atherosclerosis. Disc levels: No significant disc space pathology at L3-4 or above. L4-5: Facet arthropathy with 3 mm of anterolisthesis. Endplate osteophytes and bulging of the disc. Moderate multifactorial stenosis. L5-S1: Disc bulge more prominent towards the left. Bilateral facet degeneration. Lateral recess and foraminal encroachment. IMPRESSION: Probable superior endplate fracture at T12 with loss of height of 1/3. No retropulsion. Minimal posterior bowing of the posterosuperior margin. Old healed fractures at L1, L2 and L5. Degenerative disease with moderate multifactorial stenosis at L4-5. Electronically Signed   By: Paulina FusiMark  Shogry M.D.   On: 06/28/2019 10:43    Procedures Procedures (including critical care time)  Medications Ordered in ED Medications   HYDROcodone-acetaminophen (NORCO/VICODIN) 5-325 MG per tablet 1 tablet (1 tablet Oral Given 06/28/19 1038)  HYDROcodone-acetaminophen (NORCO/VICODIN) 5-325 MG per tablet 1 tablet (1 tablet Oral Given 06/28/19 1441)     Initial Impression / Assessment and Plan / ED Course  I have reviewed the triage vital signs and the nursing notes.  Pertinent labs & imaging results that were available during my care of the patient were reviewed by me and considered in my medical decision making (see chart for details).  Clinical Course as of Jun 27 1713  Wynelle LinkSun Jun 28, 2019  1129 Discussed with neurosurgery who is recommending a TLSO.  She can follow-up with Dr. Lovell SheehanJenkins in the office in 2 weeks.  Pain control.   [MB]  1423 Brace company is here now and fitting the patient for brace.  Anticipate discharge soon.   [MB]    Clinical Course User Index [MB] Terrilee FilesButler, Lennie Dunnigan C, MD        Final Clinical Impressions(s) / ED Diagnoses   Final diagnoses:  Compression fracture of T12 vertebra, initial encounter [facility](HCC)    ED Discharge Orders         Ordered    HYDROcodone-acetaminophen (NORCO) 5-325 MG tablet  Every 6 hours PRN,   Status:  Discontinued     06/28/19 1402    HYDROcodone-acetaminophen (NORCO) 5-325 MG tablet  Every 6 hours PRN     06/28/19 1404           Terrilee FilesButler, Velera Lansdale C, MD 06/28/19 1714

## 2019-07-03 ENCOUNTER — Telehealth: Payer: Self-pay | Admitting: *Deleted

## 2019-07-03 ENCOUNTER — Inpatient Hospital Stay (HOSPITAL_COMMUNITY)
Admission: EM | Admit: 2019-07-03 | Discharge: 2019-07-12 | DRG: 478 | Disposition: A | Discharge status: Skilled Nursing Facility | Payer: Medicare Other | Attending: Internal Medicine | Admitting: Internal Medicine

## 2019-07-03 ENCOUNTER — Encounter (HOSPITAL_COMMUNITY): Payer: Self-pay | Admitting: Emergency Medicine

## 2019-07-03 ENCOUNTER — Other Ambulatory Visit: Payer: Self-pay

## 2019-07-03 ENCOUNTER — Emergency Department (HOSPITAL_COMMUNITY): Payer: Medicare Other

## 2019-07-03 DIAGNOSIS — H9190 Unspecified hearing loss, unspecified ear: Secondary | ICD-10-CM | POA: Diagnosis present

## 2019-07-03 DIAGNOSIS — Z66 Do not resuscitate: Secondary | ICD-10-CM | POA: Diagnosis present

## 2019-07-03 DIAGNOSIS — N390 Urinary tract infection, site not specified: Secondary | ICD-10-CM | POA: Diagnosis present

## 2019-07-03 DIAGNOSIS — I471 Supraventricular tachycardia: Secondary | ICD-10-CM | POA: Diagnosis present

## 2019-07-03 DIAGNOSIS — D539 Nutritional anemia, unspecified: Secondary | ICD-10-CM | POA: Diagnosis not present

## 2019-07-03 DIAGNOSIS — S22089A Unspecified fracture of T11-T12 vertebra, initial encounter for closed fracture: Secondary | ICD-10-CM | POA: Diagnosis present

## 2019-07-03 DIAGNOSIS — Z79899 Other long term (current) drug therapy: Secondary | ICD-10-CM

## 2019-07-03 DIAGNOSIS — M549 Dorsalgia, unspecified: Secondary | ICD-10-CM | POA: Diagnosis not present

## 2019-07-03 DIAGNOSIS — I160 Hypertensive urgency: Secondary | ICD-10-CM | POA: Diagnosis present

## 2019-07-03 DIAGNOSIS — K5900 Constipation, unspecified: Secondary | ICD-10-CM | POA: Diagnosis present

## 2019-07-03 DIAGNOSIS — E861 Hypovolemia: Secondary | ICD-10-CM | POA: Diagnosis not present

## 2019-07-03 DIAGNOSIS — M546 Pain in thoracic spine: Secondary | ICD-10-CM

## 2019-07-03 DIAGNOSIS — M48061 Spinal stenosis, lumbar region without neurogenic claudication: Secondary | ICD-10-CM | POA: Diagnosis present

## 2019-07-03 DIAGNOSIS — L8991 Pressure ulcer of unspecified site, stage 1: Secondary | ICD-10-CM | POA: Diagnosis present

## 2019-07-03 DIAGNOSIS — Z20828 Contact with and (suspected) exposure to other viral communicable diseases: Secondary | ICD-10-CM | POA: Diagnosis present

## 2019-07-03 DIAGNOSIS — M4854XA Collapsed vertebra, not elsewhere classified, thoracic region, initial encounter for fracture: Principal | ICD-10-CM | POA: Diagnosis present

## 2019-07-03 DIAGNOSIS — S22080A Wedge compression fracture of T11-T12 vertebra, initial encounter for closed fracture: Secondary | ICD-10-CM

## 2019-07-03 DIAGNOSIS — E860 Dehydration: Secondary | ICD-10-CM | POA: Diagnosis not present

## 2019-07-03 DIAGNOSIS — L8990 Pressure ulcer of unspecified site, unspecified stage: Secondary | ICD-10-CM | POA: Insufficient documentation

## 2019-07-03 DIAGNOSIS — E871 Hypo-osmolality and hyponatremia: Secondary | ICD-10-CM | POA: Diagnosis not present

## 2019-07-03 DIAGNOSIS — B9620 Unspecified Escherichia coli [E. coli] as the cause of diseases classified elsewhere: Secondary | ICD-10-CM | POA: Diagnosis present

## 2019-07-03 DIAGNOSIS — I129 Hypertensive chronic kidney disease with stage 1 through stage 4 chronic kidney disease, or unspecified chronic kidney disease: Secondary | ICD-10-CM | POA: Diagnosis present

## 2019-07-03 DIAGNOSIS — N183 Chronic kidney disease, stage 3 unspecified: Secondary | ICD-10-CM | POA: Diagnosis present

## 2019-07-03 LAB — CBC WITH DIFFERENTIAL/PLATELET
Abs Immature Granulocytes: 0.04 10*3/uL (ref 0.00–0.07)
Basophils Absolute: 0.1 10*3/uL (ref 0.0–0.1)
Basophils Relative: 1 %
Eosinophils Absolute: 0.2 10*3/uL (ref 0.0–0.5)
Eosinophils Relative: 3 %
HCT: 30.7 % — ABNORMAL LOW (ref 36.0–46.0)
Hemoglobin: 10.7 g/dL — ABNORMAL LOW (ref 12.0–15.0)
Immature Granulocytes: 0 %
Lymphocytes Relative: 14 %
Lymphs Abs: 1.2 10*3/uL (ref 0.7–4.0)
MCH: 35.1 pg — ABNORMAL HIGH (ref 26.0–34.0)
MCHC: 34.9 g/dL (ref 30.0–36.0)
MCV: 100.7 fL — ABNORMAL HIGH (ref 80.0–100.0)
Monocytes Absolute: 0.9 10*3/uL (ref 0.1–1.0)
Monocytes Relative: 10 %
Neutro Abs: 6.5 10*3/uL (ref 1.7–7.7)
Neutrophils Relative %: 72 %
Platelets: 237 10*3/uL (ref 150–400)
RBC: 3.05 MIL/uL — ABNORMAL LOW (ref 3.87–5.11)
RDW: 11.9 % (ref 11.5–15.5)
WBC: 8.9 10*3/uL (ref 4.0–10.5)
nRBC: 0 % (ref 0.0–0.2)

## 2019-07-03 MED ORDER — FENTANYL CITRATE (PF) 100 MCG/2ML IJ SOLN
25.0000 ug | Freq: Once | INTRAMUSCULAR | Status: AC
  Administered 2019-07-03: 25 ug via INTRAVENOUS
  Filled 2019-07-03: qty 2

## 2019-07-03 NOTE — Telephone Encounter (Signed)
Pt niece Alicia Schwartz) called regarding her not being able to care for aunt in her home.  Alicia Schwartz states pt needs SNF and she has been in touch with Juliann Pulse at [facility] who is willing to accept pt in her facility.  EDCM advised that pt will need PT evaluation and she MAY not qualify for SNF and may have to return home. Alicia Schwartz states she will take that chance and bring her back to ED.

## 2019-07-03 NOTE — ED Provider Notes (Addendum)
[facility] Provider Note   CSN: [id number] Arrival date & time: 07/03/19  1521     History Chief Complaint  Patient presents with  . Constipation    Alicia Schwartz is a 85 y.o. female.  Patient to ED with uncontrolled severe pain in her thoracic back where she has a recently diagnosed T12 compression fracture. She was seen 06/28/19 where imaging showed fracture with 33% loss of height. She was discharged with a TSLO brace and neurosurgical follow up. She has been seen by neurosurgery as recommended who did not feel there was a surgical solution. She is not wearing the brace which was discussed with him. She has been taking Norco for pain without much relief. The patient lives by herself and cannot perform self care, cannot be mobile without significant pain and difficulty making her a fall risk. Her niece brings her back to the hospital today for re-evaluation and pain control.   The history is provided by the patient and a relative. No language interpreter was used.  Constipation Associated symptoms: back pain   Associated symptoms: no dysuria and no fever        Past Medical History:  Diagnosis Date  . Fracture, subtrochanteric, right femur, closed ([place]) 02/11/2015  . Hypertension   . Paroxysmal supraventricular tachycardia ([place]) 02/12/2015    Patient Active Problem List   Diagnosis Date Noted  . Postoperative anemia due to acute blood loss 02/14/2015  . Paroxysmal supraventricular tachycardia ([place]) 02/12/2015  . Fracture, subtrochanteric, right femur, closed ([place]) 02/11/2015  . Fall due to stumbling 02/11/2015  . Anxiety state 09/19/2006  . HYPERTENSION, BENIGN SYSTEMIC 09/19/2006    Past Surgical History:  Procedure Laterality Date  . ABDOMINAL HYSTERECTOMY    . INTRAMEDULLARY (IM) NAIL INTERTROCHANTERIC Right 02/12/2015   Procedure: INTRAMEDULLARY (IM) NAIL INTERTROCHANTRIC;  Surgeon: Marchia Bond, MD;  Location: [facility];   Service: Orthopedics;  Laterality: Right;     OB History   No obstetric history on file.     Family History  Problem Relation Age of Onset  . Alzheimer's disease Mother   . Prostate cancer Father     Social History   Tobacco Use  . Smoking status: Never Smoker  . Smokeless tobacco: Never Used  Substance Use Topics  . Alcohol use: No  . Drug use: Not on file    Home Medications Prior to Admission medications   Medication Sig Start Date End Date Taking? Authorizing Provider  acetaminophen (TYLENOL) 325 MG tablet Take 2 tablets (650 mg total) by mouth every 6 (six) hours as needed for mild pain (or Fever >/= 101). Patient not taking: Reported on 06/28/2019 02/15/15   Geradine Girt, DO  alum & mag hydroxide-simeth (MAALOX/MYLANTA) 200-200-20 MG/5ML suspension Take 30 mLs by mouth every 4 (four) hours as needed for indigestion. Patient not taking: Reported on 06/28/2019 02/15/15   Geradine Girt, DO  baclofen (LIORESAL) 10 MG tablet Take 1 tablet (10 mg total) by mouth 3 (three) times daily. As needed for muscle spasm Patient not taking: Reported on 06/28/2019 02/12/15   Marchia Bond, MD  bisacodyl (DULCOLAX) 10 MG suppository Place 1 suppository (10 mg total) rectally daily as needed for moderate constipation. Patient not taking: Reported on 06/28/2019 02/15/15   Geradine Girt, DO  CALCIUM PO Take 1 tablet by mouth daily.    [provider]  docusate sodium (COLACE) 100 MG capsule Take 1 capsule (100 mg total) by mouth 2 (two)  times daily. Patient not taking: Reported on 06/28/2019 02/15/15   Joseph Art, DO  enoxaparin (LOVENOX) 40 MG/0.4ML injection Inject 0.4 mLs (40 mg total) into the skin daily. Patient not taking: Reported on 06/28/2019 02/12/15   Teryl Lucy, MD  ferrous sulfate 325 (65 FE) MG tablet Take 1 tablet (325 mg total) by mouth 3 (three) times daily after meals. Patient not taking: Reported on 06/28/2019 02/15/15   Joseph Art, DO  folic acid  (FOLVITE) 1 MG tablet Take 1 mg by mouth daily.    [provider]  HYDROcodone-acetaminophen (NORCO) 5-325 MG tablet Take 1-2 tablets by mouth every 6 (six) hours as needed for moderate pain. MAXIMUM TOTAL ACETAMINOPHEN DOSE IS 4000 MG PER DAY 06/28/19   Terrilee Files, MD  ibuprofen (ADVIL) 200 MG tablet Take 200 mg by mouth every 6 (six) hours as needed for moderate pain.    [provider]  metoprolol succinate (TOPROL-XL) 50 MG 24 hr tablet Take 50 mg by mouth 2 (two) times daily.  06/23/19   [provider]  Multiple Vitamins-Minerals (MULTIVITAMIN GUMMIES ADULT PO) Take 1 capsule by mouth daily.    [provider]  polyethylene glycol (MIRALAX / GLYCOLAX) packet Take 17 g by mouth daily. Patient not taking: Reported on 06/28/2019 02/15/15   Joseph Art, DO  sennosides-docusate sodium (SENOKOT-S) 8.6-50 MG tablet Take 2 tablets by mouth daily. Patient not taking: Reported on 06/28/2019 02/12/15   Teryl Lucy, MD  spironolactone (ALDACTONE) 25 MG tablet Take 25 mg by mouth daily.  01/22/15   [provider]    Allergies    Patient has no known allergies.  Review of Systems   Review of Systems  Constitutional: Negative for chills and fever.  HENT: Negative.   Respiratory: Negative.  Negative for cough and shortness of breath.   Cardiovascular: Negative.  Negative for chest pain.  Gastrointestinal: Positive for constipation.  Genitourinary: Negative for dysuria and enuresis.  Musculoskeletal: Positive for back pain. Negative for neck pain.  Skin: Negative.   Neurological: Negative for numbness.    Physical Exam Updated Vital Signs BP (!) 202/67 (BP Location: Left Arm)   Pulse 75   Temp 98 F (36.7 C) (Oral)   Resp 19   SpO2 98%   Physical Exam Vitals and nursing note reviewed.  Constitutional:      Comments: Uncomfortable appearing  HENT:     Head: Normocephalic.  Cardiovascular:     Rate and Rhythm: Normal rate.     Heart  sounds: No murmur.  Pulmonary:     Effort: Pulmonary effort is normal.     Breath sounds: No wheezing, rhonchi or rales.  Abdominal:     General: There is no distension.     Palpations: Abdomen is soft.     Tenderness: There is no abdominal tenderness.  Musculoskeletal:        General: Normal range of motion.     Cervical back: Normal range of motion and neck supple.     Comments: Moves all extremities. Significant tenderness to mid back. No swelling, redness, skin breakdown.  Skin:    General: Skin is warm and dry.  Neurological:     General: No focal deficit present.     Mental Status: She is alert and oriented to person, place, and time.     ED Results / Procedures / Treatments   Labs (all labs ordered are listed, but only abnormal results are displayed)EKG None  Radiology  No results found.  Procedures Procedures (including critical care time)  Medications Ordered in ED Medications  fentaNYL (SUBLIMAZE) injection 25 mcg (has no administration in time range)    ED Course  I have reviewed the triage vital signs and the nursing notes.  Pertinent labs & imaging results that were available during my care of the patient were reviewed by me and considered in my medical decision making (see chart for details).    MDM Rules/Calculators/A&P     CHA2DS2/VAS Stroke Risk Points      N/A >= 2 Points: High Risk  1 - 1.99 Points: Medium Risk  0 Points: Low Risk    A final score could not be computed because of missing components.: Last  Change: N/A     This score determines the patient's risk of having a stroke if the  patient has atrial fibrillation.      This score is not applicable to this patient. Components are not  calculated.                   Patient to ED with complaint of uncontrolled back pain after diagnosis of T12 compression fx on 06/28/19. She is taking Norco without relief.   Niece is at bedside who helps with history. The patient lives alone and cannot  manage her own care secondary to pain. No fever, vomiting. She has seen her PCP who has started the process of nursing home placement for rehab, per niece.  She feels she may be constipated but denies abdominal pain or vomiting. She is using a stool softener and Metamucil regularly.   At this point, the patient will need admission for pain management and to facilitate admission to rehab facility until she can safely return home under her own care.   Discussed with Dr. Toniann FailKakrakandy who accepts the patient for admission. His help with her care is very much appreciated.   Final Clinical Impression(s) / ED Diagnoses Final diagnoses:  None   1. Back pain 2. Acute T12 compression fracture  Rx / DC Orders ED Discharge Orders    None       Elpidio AnisUpstill, Minnette Merida, PA-C 07/04/19 [id number]    Ward, Layla MawKristen N, DO 07/04/19 [id number]    Elpidio AnisUpstill, Shyenne Maggard, PA-C 07/14/19 0430    Ward, Layla MawKristen N, DO 07/14/19 [phone]

## 2019-07-03 NOTE — ED Notes (Addendum)
Daughter yelling at this RN stating pt is not constipated when this RN explained why this RN can't request pain medication at this time, RN explained that triage note states this complaint, assured pt we would room as soon as able, pt placed in recliner for more comfort.

## 2019-07-03 NOTE — ED Triage Notes (Signed)
Patient c/o constipation since being on pain medication for recent compression fracture of T12. Patient is not currently wearing brace. Denies any pain.

## 2019-07-03 NOTE — ED Notes (Signed)
This RN called xray to inquire about DG abdomen,was told pt refused earlier until she is able to talk to a doctor.

## 2019-07-04 ENCOUNTER — Encounter (HOSPITAL_COMMUNITY): Payer: Self-pay | Admitting: Internal Medicine

## 2019-07-04 DIAGNOSIS — N183 Chronic kidney disease, stage 3 unspecified: Secondary | ICD-10-CM | POA: Diagnosis present

## 2019-07-04 DIAGNOSIS — M549 Dorsalgia, unspecified: Secondary | ICD-10-CM | POA: Diagnosis present

## 2019-07-04 DIAGNOSIS — L8990 Pressure ulcer of unspecified site, unspecified stage: Secondary | ICD-10-CM | POA: Insufficient documentation

## 2019-07-04 DIAGNOSIS — K5900 Constipation, unspecified: Secondary | ICD-10-CM | POA: Diagnosis present

## 2019-07-04 DIAGNOSIS — I471 Supraventricular tachycardia: Secondary | ICD-10-CM | POA: Diagnosis present

## 2019-07-04 DIAGNOSIS — Z20828 Contact with and (suspected) exposure to other viral communicable diseases: Secondary | ICD-10-CM | POA: Diagnosis present

## 2019-07-04 DIAGNOSIS — I129 Hypertensive chronic kidney disease with stage 1 through stage 4 chronic kidney disease, or unspecified chronic kidney disease: Secondary | ICD-10-CM | POA: Diagnosis present

## 2019-07-04 DIAGNOSIS — S22089A Unspecified fracture of T11-T12 vertebra, initial encounter for closed fracture: Secondary | ICD-10-CM | POA: Diagnosis present

## 2019-07-04 DIAGNOSIS — M546 Pain in thoracic spine: Secondary | ICD-10-CM | POA: Diagnosis not present

## 2019-07-04 DIAGNOSIS — N390 Urinary tract infection, site not specified: Secondary | ICD-10-CM | POA: Diagnosis present

## 2019-07-04 DIAGNOSIS — Z66 Do not resuscitate: Secondary | ICD-10-CM | POA: Diagnosis present

## 2019-07-04 DIAGNOSIS — M4854XA Collapsed vertebra, not elsewhere classified, thoracic region, initial encounter for fracture: Secondary | ICD-10-CM | POA: Diagnosis present

## 2019-07-04 DIAGNOSIS — H9190 Unspecified hearing loss, unspecified ear: Secondary | ICD-10-CM | POA: Diagnosis present

## 2019-07-04 DIAGNOSIS — S22088A Other fracture of T11-T12 vertebra, initial encounter for closed fracture: Secondary | ICD-10-CM

## 2019-07-04 DIAGNOSIS — B9620 Unspecified Escherichia coli [E. coli] as the cause of diseases classified elsewhere: Secondary | ICD-10-CM | POA: Diagnosis present

## 2019-07-04 DIAGNOSIS — E861 Hypovolemia: Secondary | ICD-10-CM | POA: Diagnosis not present

## 2019-07-04 DIAGNOSIS — E871 Hypo-osmolality and hyponatremia: Secondary | ICD-10-CM | POA: Diagnosis not present

## 2019-07-04 DIAGNOSIS — I160 Hypertensive urgency: Secondary | ICD-10-CM | POA: Diagnosis present

## 2019-07-04 DIAGNOSIS — L8991 Pressure ulcer of unspecified site, stage 1: Secondary | ICD-10-CM | POA: Diagnosis present

## 2019-07-04 DIAGNOSIS — E860 Dehydration: Secondary | ICD-10-CM | POA: Diagnosis not present

## 2019-07-04 DIAGNOSIS — D539 Nutritional anemia, unspecified: Secondary | ICD-10-CM | POA: Diagnosis not present

## 2019-07-04 DIAGNOSIS — Z79899 Other long term (current) drug therapy: Secondary | ICD-10-CM | POA: Diagnosis not present

## 2019-07-04 DIAGNOSIS — M48061 Spinal stenosis, lumbar region without neurogenic claudication: Secondary | ICD-10-CM | POA: Diagnosis present

## 2019-07-04 LAB — URINALYSIS, ROUTINE W REFLEX MICROSCOPIC
Bilirubin Urine: NEGATIVE
Glucose, UA: NEGATIVE mg/dL
Hgb urine dipstick: NEGATIVE
Ketones, ur: NEGATIVE mg/dL
Leukocytes,Ua: NEGATIVE
Nitrite: POSITIVE — AB
Protein, ur: NEGATIVE mg/dL
Specific Gravity, Urine: 1.004 — ABNORMAL LOW (ref 1.005–1.030)
pH: 6 (ref 5.0–8.0)

## 2019-07-04 LAB — BASIC METABOLIC PANEL
Anion gap: 9 (ref 5–15)
BUN: 12 mg/dL (ref 8–23)
CO2: 22 mmol/L (ref 22–32)
Calcium: 8.9 mg/dL (ref 8.9–10.3)
Chloride: 99 mmol/L (ref 98–111)
Creatinine, Ser: 1.06 mg/dL — ABNORMAL HIGH (ref 0.44–1.00)
GFR calc Af Amer: 55 mL/min — ABNORMAL LOW (ref >60)
GFR calc non Af Amer: 48 mL/min — ABNORMAL LOW (ref >60)
Glucose, Bld: 110 mg/dL — ABNORMAL HIGH (ref 70–99)
Potassium: 4.6 mmol/L (ref 3.5–5.1)
Sodium: 130 mmol/L — ABNORMAL LOW (ref 135–145)

## 2019-07-04 LAB — SARS CORONAVIRUS 2 (TAT 6-24 HRS): SARS Coronavirus 2: NEGATIVE

## 2019-07-04 MED ORDER — FENTANYL CITRATE (PF) 100 MCG/2ML IJ SOLN
25.0000 ug | INTRAMUSCULAR | Status: DC | PRN
  Administered 2019-07-04 – 2019-07-05 (×8): 25 ug via INTRAVENOUS
  Filled 2019-07-04 (×8): qty 2

## 2019-07-04 MED ORDER — ONDANSETRON HCL 4 MG/2ML IJ SOLN: 4.0000 mg | Freq: Four times a day (QID) | INTRAMUSCULAR | Status: DC | PRN

## 2019-07-04 MED ORDER — ENOXAPARIN SODIUM 40 MG/0.4ML ~~LOC~~ SOLN
40.0000 mg | SUBCUTANEOUS | Status: AC
  Administered 2019-07-04 – 2019-07-07 (×4): 40 mg via SUBCUTANEOUS
  Filled 2019-07-04 (×4): qty 0.4

## 2019-07-04 MED ORDER — LABETALOL HCL 5 MG/ML IV SOLN
10.0000 mg | INTRAVENOUS | Status: DC | PRN
  Administered 2019-07-04 – 2019-07-07 (×11): 10 mg via INTRAVENOUS
  Filled 2019-07-04 (×11): qty 4

## 2019-07-04 MED ORDER — SPIRONOLACTONE 25 MG PO TABS
25.0000 mg | ORAL_TABLET | Freq: Every day | ORAL | Status: DC
  Administered 2019-07-04 – 2019-07-11 (×8): 25 mg via ORAL
  Filled 2019-07-04 (×9): qty 1

## 2019-07-04 MED ORDER — SENNA 8.6 MG PO TABS
1.0000 | ORAL_TABLET | Freq: Every day | ORAL | Status: DC
  Administered 2019-07-04: 05:00:00 8.6 mg via ORAL
  Filled 2019-07-04 (×2): qty 1

## 2019-07-04 MED ORDER — ONDANSETRON HCL 4 MG PO TABS: 4.0000 mg | ORAL_TABLET | Freq: Four times a day (QID) | ORAL | Status: DC | PRN

## 2019-07-04 MED ORDER — DOCUSATE SODIUM 100 MG PO CAPS
100.0000 mg | ORAL_CAPSULE | Freq: Every day | ORAL | Status: DC
  Administered 2019-07-07 – 2019-07-12 (×6): 100 mg via ORAL
  Filled 2019-07-04 (×7): qty 1

## 2019-07-04 MED ORDER — ACETAMINOPHEN 650 MG RE SUPP: 650.0000 mg | Freq: Four times a day (QID) | RECTAL | Status: DC | PRN

## 2019-07-04 MED ORDER — METOPROLOL SUCCINATE ER 50 MG PO TB24
50.0000 mg | ORAL_TABLET | Freq: Every evening | ORAL | Status: DC
  Administered 2019-07-04 – 2019-07-11 (×8): 50 mg via ORAL
  Filled 2019-07-04 (×8): qty 1

## 2019-07-04 MED ORDER — ACETAMINOPHEN 325 MG PO TABS
650.0000 mg | ORAL_TABLET | Freq: Four times a day (QID) | ORAL | Status: DC | PRN
  Administered 2019-07-06 – 2019-07-08 (×2): 650 mg via ORAL
  Filled 2019-07-04 (×2): qty 2

## 2019-07-04 MED ORDER — POLYETHYLENE GLYCOL 3350 17 G PO PACK
17.0000 g | PACK | Freq: Two times a day (BID) | ORAL | Status: DC
  Administered 2019-07-04: 17 g via ORAL
  Filled 2019-07-04: qty 1

## 2019-07-04 MED ORDER — DOCUSATE SODIUM 100 MG PO CAPS
100.0000 mg | ORAL_CAPSULE | Freq: Two times a day (BID) | ORAL | Status: DC
  Administered 2019-07-04: 10:00:00 100 mg via ORAL
  Filled 2019-07-04: qty 1

## 2019-07-04 MED ORDER — POLYETHYLENE GLYCOL 3350 17 G PO PACK
17.0000 g | PACK | Freq: Every day | ORAL | Status: DC
  Administered 2019-07-12: 17 g via ORAL
  Filled 2019-07-04 (×7): qty 1

## 2019-07-04 MED ORDER — FENTANYL CITRATE (PF) 100 MCG/2ML IJ SOLN
25.0000 ug | Freq: Once | INTRAMUSCULAR | Status: AC
  Administered 2019-07-04: 25 ug via INTRAVENOUS
  Filled 2019-07-04: qty 2

## 2019-07-04 MED ORDER — FOLIC ACID 1 MG PO TABS
1.0000 mg | ORAL_TABLET | Freq: Every day | ORAL | Status: DC
  Administered 2019-07-04 – 2019-07-12 (×9): 1 mg via ORAL
  Filled 2019-07-04 (×9): qty 1

## 2019-07-04 MED ORDER — SENNA 8.6 MG PO TABS: 1.0000 | ORAL_TABLET | Freq: Two times a day (BID) | ORAL | Status: DC | PRN

## 2019-07-04 NOTE — H&P (Signed)
History and Physical    Alicia Schwartz ZOX:[id number] DOB: 04/06/1931 DOA: 07/03/2019  PCP: Aida PufferLittle, James, MD  Patient coming from: Home.  Chief Complaint: Mid back pain.  HPI: Alicia Schwartz is a 85 y.o. female with history of hypertension paroxysmal supraventricular tachycardia and chronic kidney disease stage II was brought to the ER for the second time in last 1 week because of worsening back pain.  About a week ago when patient was bending to take something her mid back popped and started having severe pain.  At that time patient came to the ER CT lumbar spine showed T12 endplate fracture and neurosurgeon advised to stop brace and had follow-up with neurosurgeon June 29, 2019 following the ER visit.  Since patient's pain has been getting worse family brought patient back to the ER.  No incontinence of urine or weakness of the lower extremities.  ED Course: In the ER on exam patient appears nonfocal patient has hard of hearing.  Labs show creatinine of 1 hemoglobin 10 platelets 237 Covid test negative.  Patient admitted for further management of severe back pain after T12 endplate fracture.  Review of Systems: As per HPI, rest all negative.   Past Medical History:  Diagnosis Date  . Fracture, subtrochanteric, right femur, closed ([facility]) 02/11/2015  . Hypertension   . Paroxysmal supraventricular tachycardia ([facility]) 02/12/2015    Past Surgical History:  Procedure Laterality Date  . ABDOMINAL HYSTERECTOMY    . INTRAMEDULLARY (IM) NAIL INTERTROCHANTERIC Right 02/12/2015   Procedure: INTRAMEDULLARY (IM) NAIL INTERTROCHANTRIC;  Surgeon: Teryl LucyJoshua Landau, MD;  Location: [facility];  Service: Orthopedics;  Laterality: Right;     reports that she has never smoked. She has never used smokeless tobacco. She reports that she does not drink alcohol. No history on file for drug.  No Known Allergies  Family History  Problem Relation Age of Onset  . Alzheimer's disease Mother   . Prostate  cancer Father     Prior to Admission medications   Medication Sig Start Date End Date Taking? Authorizing Provider  CALCIUM PO Take 1 tablet by mouth daily.   Yes [provider]  docusate sodium (COLACE) 100 MG capsule Take 1 capsule (100 mg total) by mouth 2 (two) times daily. 02/15/15  Yes Joseph ArtVann, Jessica U, DO  folic acid (FOLVITE) 1 MG tablet Take 1 mg by mouth daily.   Yes [provider]  HYDROcodone-acetaminophen (NORCO) 5-325 MG tablet Take 1-2 tablets by mouth every 6 (six) hours as needed for moderate pain. MAXIMUM TOTAL ACETAMINOPHEN DOSE IS 4000 MG PER DAY 06/28/19  Yes Terrilee FilesButler, Michael C, MD  ibuprofen (ADVIL) 200 MG tablet Take 200 mg by mouth every 6 (six) hours as needed for moderate pain.   Yes [provider]  metoprolol succinate (TOPROL-XL) 50 MG 24 hr tablet Take 50 mg by mouth every evening.  06/23/19  Yes [provider]  Multiple Vitamins-Minerals (MULTIVITAMIN GUMMIES ADULT PO) Take 1 capsule by mouth daily.   Yes [provider]  spironolactone (ALDACTONE) 25 MG tablet Take 25 mg by mouth daily.  01/22/15  Yes [provider]  acetaminophen (TYLENOL) 325 MG tablet Take 2 tablets (650 mg total) by mouth every 6 (six) hours as needed for mild pain (or Fever >/= 101). Patient not taking: Reported on 06/28/2019 02/15/15   Joseph ArtVann, Jessica U, DO  alum & mag hydroxide-simeth (MAALOX/MYLANTA) 200-200-20 MG/5ML suspension Take 30 mLs by mouth every 4 (four) hours as needed for indigestion. Patient  not taking: Reported on 06/28/2019 02/15/15   Geradine Girt, DO  baclofen (LIORESAL) 10 MG tablet Take 1 tablet (10 mg total) by mouth 3 (three) times daily. As needed for muscle spasm Patient not taking: Reported on 06/28/2019 02/12/15   Marchia Bond, MD  bisacodyl (DULCOLAX) 10 MG suppository Place 1 suppository (10 mg total) rectally daily as needed for moderate constipation. Patient not taking: Reported on 06/28/2019 02/15/15   Geradine Girt, DO  enoxaparin (LOVENOX) 40 MG/0.4ML injection Inject 0.4 mLs (40 mg total) into the skin daily. Patient not taking: Reported on 06/28/2019 02/12/15   Marchia Bond, MD  ferrous sulfate 325 (65 FE) MG tablet Take 1 tablet (325 mg total) by mouth 3 (three) times daily after meals. Patient not taking: Reported on 06/28/2019 02/15/15   Geradine Girt, DO  polyethylene glycol (MIRALAX / GLYCOLAX) packet Take 17 g by mouth daily. Patient not taking: Reported on 06/28/2019 02/15/15   Geradine Girt, DO  sennosides-docusate sodium (SENOKOT-S) 8.6-50 MG tablet Take 2 tablets by mouth daily. Patient not taking: Reported on 07/04/2019 02/12/15   Marchia Bond, MD    Physical Exam: Constitutional: Moderately built and nourished. Vitals:   07/03/19 1528 07/03/19 1952 07/03/19 2210 07/04/19 0315  BP: (!) 191/63 (!) 187/63 (!) 202/67   Pulse: 67 74 75 75  Resp: 16 18 19 14   Temp: 98.9 F (37.2 C)  98 F (36.7 C)   TempSrc: Oral  Oral   SpO2: 99% 100% 98% 99%   Eyes: Anicteric no pallor. ENMT: No discharge from the ears eyes nose or mouth. Neck: No mass felt.  No neck rigidity. Respiratory: No rhonchi or crepitations. Cardiovascular: S1-S2 heard. Abdomen: Soft nontender bowel sounds present. Musculoskeletal: No edema. Skin: No rash. Neurologic: Alert awake oriented to time place and person.  Moves all extremities. Psychiatric: Appears normal per normal affect.   Labs on Admission: I have personally reviewed following labs and imaging studies  CBC: Recent Labs  Lab 07/03/19 2301  WBC 8.9  NEUTROABS 6.5  HGB 10.7*  HCT 30.7*  MCV 100.7*  PLT 176   Basic Metabolic Panel: Recent Labs  Lab 07/03/19 2301  NA 130*  K 4.6  CL 99  CO2 22  GLUCOSE 110*  BUN 12  CREATININE 1.06*  CALCIUM 8.9   GFR: CrCl cannot be calculated (Unknown ideal weight.). Liver Function Tests: No results for input(s): AST, ALT, ALKPHOS, BILITOT, PROT, ALBUMIN in the last 168 hours. No results for  input(s): LIPASE, AMYLASE in the last 168 hours. No results for input(s): AMMONIA in the last 168 hours. Coagulation Profile: No results for input(s): INR, PROTIME in the last 168 hours. Cardiac Enzymes: No results for input(s): CKTOTAL, CKMB, CKMBINDEX, TROPONINI in the last 168 hours. BNP (last 3 results) No results for input(s): PROBNP in the last 8760 hours. HbA1C: No results for input(s): HGBA1C in the last 72 hours. CBG: No results for input(s): GLUCAP in the last 168 hours. Lipid Profile: No results for input(s): CHOL, HDL, LDLCALC, TRIG, CHOLHDL, LDLDIRECT in the last 72 hours. Thyroid Function Tests: No results for input(s): TSH, T4TOTAL, FREET4, T3FREE, THYROIDAB in the last 72 hours. Anemia Panel: No results for input(s): VITAMINB12, FOLATE, FERRITIN, TIBC, IRON, RETICCTPCT in the last 72 hours. Urine analysis:    Component Value Date/Time   COLORURINE YELLOW 07/04/2019 0159   APPEARANCEUR CLEAR 07/04/2019 0159   LABSPEC 1.004 (L) 07/04/2019 0159   PHURINE 6.0 07/04/2019 0159   GLUCOSEU NEGATIVE  07/04/2019 0159   HGBUR NEGATIVE 07/04/2019 0159   BILIRUBINUR NEGATIVE 07/04/2019 0159   KETONESUR NEGATIVE 07/04/2019 0159   PROTEINUR NEGATIVE 07/04/2019 0159   NITRITE POSITIVE (A) 07/04/2019 0159   LEUKOCYTESUR NEGATIVE 07/04/2019 0159   Sepsis Labs: @LABRCNTIP (procalcitonin:4,lacticidven:4) )No results found for this or any previous visit (from the past 240 hour(s)).   Radiological Exams on Admission: No results found.    Assessment/Plan Principal Problem:   Mid back pain Active Problems:   Paroxysmal supraventricular tachycardia ([facility])   Closed T12 fracture ([facility])   Hypertensive urgency   Back pain    1. Mid back pain secondary to closed T12 fracture for which patient will be on TLSO brace on out of bed I will get physical therapy consult and social worker.  Pain medications for now. 2. Hypertensive urgency likely blood pressure worsened because of the  pain.  I have placed patient on as needed IV labetalol in addition to her metoprolol and spironolactone.  Closely follow blood pressure trends. 3. History of paroxysmal supraventricular tachycardia on metoprolol. 4. Chronic kidney disease stage III creatinine appears to be at baseline.  Please note that patient is on spironolactone.   DVT prophylaxis: Lovenox. Code Status: DNR as confirmed with patient's niece [name]. Family Communication: Discussed with patient's niece [name]. Disposition Plan: May need rehab. Consults called: Physical therapy and . Admission status: Observation.   Child psychotherapist MD [facility] Pager [phone].  If 7PM-7AM, please contact night-coverage [email or web address] Password [email or web address]  07/04/2019, 3:40 AM

## 2019-07-04 NOTE — ED Provider Notes (Signed)
Medical screening examination/treatment/procedure(s) were conducted as a shared visit with non-physician practitioner(s) and myself.  I personally evaluated the patient during the encounter.      Patient here with increasing back pain.  Recently diagnosed with T12 compression fracture.  No focal neurologic deficits.  Unable to care for herself at home.  Pain poorly controlled at home.  Taking Norco.  Has followed up with neurosurgery.  Is wearing a TLSO brace.  Patient's family very concerned and feel she needs placement, pain control.  Admitted to medicine for pain control, PT/OT.  Family would like patient placed into a rehab facility.   Coni Homesley, Delice Bison, DO 07/04/19 [phone]

## 2019-07-04 NOTE — Plan of Care (Signed)

## 2019-07-04 NOTE — Plan of Care (Signed)
Patient seen and examined she is sitting at the side of the bed PT is in the room and her appetite is appropriate.  She denies any complaint at this time.  Family would like patient to be transferred for rehab.

## 2019-07-04 NOTE — ED Notes (Signed)
ED TO INPATIENT HANDOFF REPORT  ED Nurse Name and Phone #: Koleen NimrodMonique,RN [phone]  S Name/Age/Gender Alicia Olena HeckleLea Schwartz 85 y.o. female Room/Bed: 053C/053C  Code Status   Code Status: Prior  Home/SNF/Other Home Patient oriented to: self, place, time and situation Is this baseline? Yes   Triage Complete: Triage complete  Chief Complaint Mid back pain [M54.9]  Triage Note Patient c/o constipation since being on pain medication for recent compression fracture of T12. Patient is not currently wearing brace. Denies any pain.     Allergies No Known Allergies  Level of Care/Admitting Diagnosis ED Disposition    ED Disposition Condition Comment   Admit  [facility]ospital [facility]: [facility] [[id number]]  Level of Care: Telemetry Medical [104]  I expect the patient will be discharged within 24 hours: No (not a candidate for 5C-Observation unit)  Covid Evaluation: Asymptomatic Screening Protocol (No Symptoms)  Diagnosis: Mid back pain [[id number]][[id number]]  Admitting Physician: Eduard ClosKAKRAKANDY, ARSHAD N [phone][[id number]]  Attending Physician: Eduard ClosKAKRAKANDY, ARSHAD N Florian.Pax[[id number]]       B Medical/Surgery History Past Medical History:  Diagnosis Date  . Fracture, subtrochanteric, right femur, closed (HCC) 02/11/2015  . Hypertension   . Paroxysmal supraventricular tachycardia (HCC) 02/12/2015   Past Surgical History:  Procedure Laterality Date  . ABDOMINAL HYSTERECTOMY    . INTRAMEDULLARY (IM) NAIL INTERTROCHANTERIC Right 02/12/2015   Procedure: INTRAMEDULLARY (IM) NAIL INTERTROCHANTRIC;  Surgeon: Teryl LucyJoshua Landau, MD;  Location: [facility] OR;  Service: Orthopedics;  Laterality: Right;     A IV Location/Drains/Wounds Patient Lines/Drains/Airways Status   Active Line/Drains/Airways    Name:   Placement date:   Placement time:   Site:   Days:   Incision (Closed) 02/12/15 Leg Right   02/12/15    0920     1603          Intake/Output Last 24 hours No intake or output data in the 24 hours ending 07/04/19  0335  Labs/Imaging Results for orders placed or performed during the hospital encounter of 07/03/19 (from the past 48 hour(s))  Basic metabolic panel     Status: Abnormal   Collection Time: 07/03/19 11:01 PM  Result Value Ref Range   Sodium 130 (L) 135 - 145 mmol/L   Potassium 4.6 3.5 - 5.1 mmol/L   Chloride 99 98 - 111 mmol/L   CO2 22 22 - 32 mmol/L   Glucose, Bld 110 (H) 70 - 99 mg/dL   BUN 12 8 - 23 mg/dL   Creatinine, Ser 2.951.06 (H) 0.44 - 1.00 mg/dL   Calcium 8.9 8.9 - 62.110.3 mg/dL   GFR calc non Af Amer 48 (L) >60 mL/min   GFR calc Af Amer 55 (L) >60 mL/min   Anion gap 9 5 - 15    Comment: Performed at [facility], [address] [place], [place] [address]  CBC with Differential     Status: Abnormal   Collection Time: 07/03/19 11:01 PM  Result Value Ref Range   WBC 8.9 4.0 - 10.5 K/uL   RBC 3.05 (L) 3.87 - 5.11 MIL/uL   Hemoglobin 10.7 (L) 12.0 - 15.0 g/dL   HCT 78.430.7 (L) 69.636.0 - 29.546.0 %   MCV 100.7 (H) 80.0 - 100.0 fL   MCH 35.1 (H) 26.0 - 34.0 pg   MCHC 34.9 30.0 - 36.0 g/dL   RDW 28.411.9 13.211.5 - 44.015.5 %   Platelets 237 150 - 400 K/uL   nRBC 0.0 0.0 - 0.2 %   Neutrophils Relative % 72 %  Neutro Abs 6.5 1.7 - 7.7 K/uL   Lymphocytes Relative 14 %   Lymphs Abs 1.2 0.7 - 4.0 K/uL   Monocytes Relative 10 %   Monocytes Absolute 0.9 0.1 - 1.0 K/uL   Eosinophils Relative 3 %   Eosinophils Absolute 0.2 0.0 - 0.5 K/uL   Basophils Relative 1 %   Basophils Absolute 0.1 0.0 - 0.1 K/uL   Immature Granulocytes 0 %   Abs Immature Granulocytes 0.04 0.00 - 0.07 K/uL    Comment: Performed at [facility] [address], [place], [place] [address]  Urinalysis, Routine w reflex microscopic     Status: Abnormal   Collection Time: 07/04/19  1:59 AM  Result Value Ref Range   Color, Urine YELLOW YELLOW   APPearance CLEAR CLEAR   Specific Gravity, Urine 1.004 (L) 1.005 - 1.030   pH 6.0 5.0 - 8.0   Glucose, UA NEGATIVE NEGATIVE mg/dL   Hgb urine dipstick NEGATIVE NEGATIVE    Bilirubin Urine NEGATIVE NEGATIVE   Ketones, ur NEGATIVE NEGATIVE mg/dL   Protein, ur NEGATIVE NEGATIVE mg/dL   Nitrite POSITIVE (A) NEGATIVE   Leukocytes,Ua NEGATIVE NEGATIVE   RBC / HPF 0-5 0 - 5 RBC/hpf   WBC, UA 0-5 0 - 5 WBC/hpf   Bacteria, UA MANY (A) NONE SEEN   Squamous Epithelial / LPF 0-5 0 - 5    Comment: Performed at [facility], [place] [address] [place], [place] [address]   No results found.  Pending Labs [employer] (From admission, onward)    Start     Ordered   07/04/19 0334  Urine culture  ONCE - STAT,   STAT     07/04/19 0333   07/03/19 2301  SARS CORONAVIRUS 2 (TAT 6-24 HRS) Nasopharyngeal Nasopharyngeal Swab  (Tier 3 (TAT 6-24 hrs))  Once,   STAT    Question Answer Comment  Is this test for diagnosis or screening Screening   Symptomatic for COVID-19 as defined by CDC No   Hospitalized for COVID-19 No   Admitted to ICU for COVID-19 No   Previously tested for COVID-19 Yes   Resident in a congregate (group) care setting No   Employed in healthcare setting No   Pregnant No      07/03/19 2304          Vitals/Pain Today's Vitals   07/03/19 2210 07/04/19 0040 07/04/19 0041 07/04/19 0315  BP: (!) 202/67     Pulse: 75   75  Resp: 19   14  Temp: 98 F (36.7 C)     TempSrc: Oral     SpO2: 98%   99%  PainSc:  6  8      Isolation Precautions No active isolations  Medications Medications  fentaNYL (SUBLIMAZE) injection 25 mcg (25 mcg Intravenous Given 07/03/19 2353)  fentaNYL (SUBLIMAZE) injection 25 mcg (25 mcg Intravenous Given 07/04/19 0229)    Mobility walks with device High fall risk   Focused Assessments Cardiac Assessment Handoff:    No results found for: CKTOTAL, CKMB, CKMBINDEX, TROPONINI No results found for: DDIMER Does the Patient currently have chest pain? No      R Recommendations: See Admitting Provider Note  Report given to: Malachi Paradise RN   Additional Notes: Patient A&Ox 4; Hard of hearing and has bilateral  hearing aids in; Patient is continent but has perwick applied for comfort due to back pain; Patient has hx of HTN and no BP meds taken on 07/03/2019;  Admitting has not been to see patient at this time-Monique,RN

## 2019-07-04 NOTE — Plan of Care (Signed)

## 2019-07-04 NOTE — Plan of Care (Signed)
  Problem: Education: Goal: Knowledge of General Education information will improve Description: Including pain rating scale, medication(s)/side effects and non-pharmacologic comfort measures Outcome: Progressing   Problem: Safety: Goal: Ability to remain free from injury will improve Outcome: Progressing   

## 2019-07-04 NOTE — Progress Notes (Signed)
Orthopedic Tech Progress Note Patient Details:  Alicia Schwartz [birth year] [id number]  Patient ID: Alicia Schwartz, female   DOB: June 17, 1931, 85 y.o.   MRN: [id number]   Alicia Schwartz 07/04/2019, 2:36 PMPatient has brace. [name] said she had her daughter take her brace home. The brace hurt her back and she could not wear it.

## 2019-07-04 NOTE — Progress Notes (Signed)
0415 pt arrived to 5N having back pain. Blood pressure 193/61 10mg  of labetalol administered @ 0428 Pain level 10/10 fentanyl 25 mcg administered at 0502  Will continue to monitor.

## 2019-07-04 NOTE — Evaluation (Signed)
Physical Therapy Evaluation Patient Details Name: Alicia Schwartz MRN: [id number] DOB: 08/12/1930 Today's Date: 07/04/2019   History of Present Illness  Pt is an 85 y/o female admitted secondary to worsening back pain. Pt presented to ER ~1 week ago with back pain from lifting/moving a table and then hearing a pop. At that time, pt found to have a T12 endplate fx. No surgery performed. PMH including but not limited to HTN and CKD.    Clinical Impression  Pt presented supine in bed with HOB elevated, awake and willing to participate in therapy session. Pt with increasing pain throughout session and very focused on pain. Pt also very HOH and difficult to obtain much information regarding home environment or PLOF. At the time of evaluation, pt greatly limited and required min-mod A for bed mobility. She was unable to tolerate transfers this session. Per chart review, pt's family requesting that pt d/c to SNF. No family/caregivers present in room. Pt would continue to benefit from skilled physical therapy services at this time while admitted and after d/c to address the below listed limitations in order to improve overall safety and independence with functional mobility.     Follow Up Recommendations SNF    Equipment Recommendations  None recommended by PT    Recommendations for Other Services       Precautions / Restrictions Precautions Precautions: Back;Fall Required Braces or Orthoses: Spinal Brace Spinal Brace: Thoracolumbosacral orthotic;Other (comment) Spinal Brace Comments: TLSO not present during evaluation Restrictions Weight Bearing Restrictions: No      Mobility  Bed Mobility Overal bed mobility: Needs Assistance Bed Mobility: Rolling;Sidelying to Sit Rolling: Min assist Sidelying to sit: Mod assist       General bed mobility comments: increased time and effort, cueing for log roll technique, assistance needed for LE management and trunk elevation to achieve upright  sitting at EOB  Transfers                 General transfer comment: pt refusing transfer at this time, stating "oh I can't do that right now!"  Ambulation/Gait                Stairs            Wheelchair Mobility    Modified Rankin (Stroke Patients Only)       Balance Overall balance assessment: Needs assistance Sitting-balance support: No upper extremity supported Sitting balance-Leahy Scale: Fair                                       Pertinent Vitals/Pain Pain Assessment: Faces Faces Pain Scale: Hurts even more Pain Location: back Pain Descriptors / Indicators: Grimacing;Guarding;Moaning Pain Intervention(s): Monitored during session;Repositioned    Home Living Family/patient expects to be discharged to:: Skilled nursing facility                 Additional Comments: family really wants pt to d/c to a SNF    Prior Function Level of Independence: Independent               Hand Dominance        Extremity/Trunk Assessment   Upper Extremity Assessment Upper Extremity Assessment: Generalized weakness;Difficult to assess due to impaired cognition    Lower Extremity Assessment Lower Extremity Assessment: Generalized weakness;Difficult to assess due to impaired cognition    Cervical / Trunk Assessment Cervical / Trunk Assessment: Kyphotic  Communication   Communication: HOH  Cognition Arousal/Alertness: Awake/alert Behavior During Therapy: Anxious;Restless Overall Cognitive Status: Impaired/Different from baseline Area of Impairment: Orientation;Attention;Memory;Following commands;Safety/judgement;Awareness;Problem solving                 Orientation Level: Disoriented to;Situation Current Attention Level: Selective Memory: Decreased recall of precautions;Decreased short-term memory Following Commands: Follows one step commands with increased time;Follows one step commands  inconsistently Safety/Judgement: Decreased awareness of deficits;Decreased awareness of safety Awareness: Intellectual Problem Solving: Difficulty sequencing;Requires verbal cues        General Comments      Exercises     Assessment/Plan    PT Assessment Patient needs continued PT services  PT Problem List Decreased strength;Decreased balance;Decreased activity tolerance;Decreased mobility;Decreased coordination;Decreased cognition;Decreased safety awareness;Decreased knowledge of use of DME;Decreased knowledge of precautions;Pain       PT Treatment Interventions DME instruction;Gait training;Stair training;Functional mobility training;Therapeutic exercise;Therapeutic activities;Balance training;Neuromuscular re-education;Cognitive remediation;Patient/family education    PT Goals (Current goals can be found in the Care Plan section)  Acute Rehab PT Goals Patient Stated Goal: decrease pain PT Goal Formulation: With patient Time For Goal Achievement: 07/18/19 Potential to Achieve Goals: Fair    Frequency Min 2X/week   Barriers to discharge        Co-evaluation               AM-PAC PT "6 Clicks" Mobility  Outcome Measure Help needed turning from your back to your side while in a flat bed without using bedrails?: A Little Help needed moving from lying on your back to sitting on the side of a flat bed without using bedrails?: A Lot Help needed moving to and from a bed to a chair (including a wheelchair)?: A Lot Help needed standing up from a chair using your arms (e.g., wheelchair or bedside chair)?: A Lot Help needed to walk in hospital room?: A Lot Help needed climbing 3-5 steps with a railing? : Total 6 Click Score: 12    End of Session   Activity Tolerance: Patient limited by pain;Patient limited by fatigue Patient left: in bed;with call bell/phone within reach;with bed alarm set;Other (comment)(sitting EOB to eat breakfast) Nurse Communication: Mobility  status PT Visit Diagnosis: Other abnormalities of gait and mobility (R26.89);Pain Pain - part of body: (back)    Time: 8841-6606 PT Time Calculation (min) (ACUTE ONLY): 12 min   Charges:   PT Evaluation $PT Eval Low Complexity: 1 Low          Eduard Clos, PT, DPT  Acute Rehabilitation Services Pager [phone] Office [place] 07/04/2019, 12:24 PM

## 2019-07-05 DIAGNOSIS — M549 Dorsalgia, unspecified: Secondary | ICD-10-CM

## 2019-07-05 MED ORDER — LISINOPRIL 10 MG PO TABS
10.0000 mg | ORAL_TABLET | Freq: Every day | ORAL | Status: DC
  Administered 2019-07-05: 11:00:00 10 mg via ORAL
  Filled 2019-07-05: qty 1

## 2019-07-05 MED ORDER — SODIUM CHLORIDE 0.9 % IV SOLN
1.0000 g | INTRAVENOUS | Status: DC
  Administered 2019-07-05 – 2019-07-06 (×2): 1 g via INTRAVENOUS
  Filled 2019-07-05 (×2): qty 10

## 2019-07-05 NOTE — Progress Notes (Signed)
Pt complains of constipation despite numerous bowel movements. RN checked for impaction and there was not one. Pt had large bowel movement while RN was checking for an impaction. Will continue to monitor for bowel mobility problems. Loni Abdon Elon Spanner, RN 1:32 PM 07/05/2019

## 2019-07-05 NOTE — Progress Notes (Signed)
PROGRESS NOTE  Alicia Schwartz XBM:[id number] DOB: January 23, 1931 DOA: 07/03/2019 PCP: Aida Puffer, MD  HPI/Recap of past 24 hours:  This is an 85 year old female with history of hypertension, paroxysmal tachycardia, chronic kidney disease stage III who was brought to the emergency department for the second time in 1 week because of worsening back pain.  Apparently a week ago patient was bending to do something and then she felt a pop in her mid back and started having severe pain she was seen at the emergency department a CT scan was done which showed T12 endplate fracture and advised to stop brace and follow-up with neurosurgery on June 29, 2019.  She returns to the emergency department because of pain has not recently worsened.  Subjective: Patient seen and examined at bedside  July 05, 2019. Subjective: Patient seen and examined at bedside she is complaining of not having adequate emptying of her bowels.  She was given some MiraLAX and she made some bowel movement which did patient and the nurse stated that she did have some loose stool but patient seems to still complaining that her bowels are hard to make  Assessment/Plan: Principal Problem:   Mid back pain Active Problems:   Paroxysmal supraventricular tachycardia (HCC)   Closed T12 fracture (HCC)   Hypertensive urgency   Back pain   Pressure injury of skin   1.  Severe mid back pain secondary to close compression fracture of T12.  Patient is not able to wear her back brace because of pain.  2.  Hypertension urgency blood pressure was in the 200s earlier this morning.  3.  Paroxysmal supraventricular tachycardia.  Continue metoprolol  4.  Chronic kidney disease stage III.  Creatinine seems to be at baseline, 1.06  5.  Urinary tract infection patient will be started on ceftriaxone due to her kidney function  6.  Constipation patient was given MiraLAX.  And she is having some loose stool nurse advised to check for  impaction with seeping of stool or watery stool around the hard stool  Code Status: DNR  Severity of Illness: The appropriate patient status for this patient is INPATIENT. Inpatient status is judged to be reasonable and necessary in order to provide the required intensity of service to ensure the patient's safety. The patient's presenting symptoms, physical exam findings, and initial radiographic and laboratory data in the context of their chronic comorbidities is felt to place them at high risk for further clinical deterioration. Furthermore, it is not anticipated that the patient will be medically stable for discharge from the hospital within 2 midnights of admission. The following factors support the patient status of inpatient.   " The patient's presenting symptoms include severe low back pain Patient has fracture of the lumbar spine T12  * I certify that at the point of admission it is my clinical judgment that the patient will require inpatient hospital care spanning beyond 2 midnights from the point of admission due to high intensity of service, high risk for further deterioration and high frequency of surveillance required.*    Family Communicati   Disposition Plan: To be determined.  Seems family wants him to go to rehab.  Social worker was consulted to assist in disposition   Consultants:  None  Procedures:  None  Antimicrobials:  None  DVT prophylaxis: Lovenox   Objective: Vitals:   07/04/19 2024 07/04/19 2222 07/05/19 0400 07/05/19 0750  BP: (!) 208/64 (!) 205/80 (!) 204/73 (!) 175/64  Pulse: 67 78 75  66  Resp:   17 16  Temp: 98.7 F (37.1 C)  98.2 F (36.8 C) 98 F (36.7 C)  TempSrc: Oral  Oral Oral  SpO2: 100% 100% 97% 97%    Intake/Output Summary (Last 24 hours) at 07/05/2019 2878 Last data filed at 07/04/2019 2021 Gross per 24 hour  Intake 720 ml  Output 800 ml  Net -80 ml   There were no vitals filed for this visit. There is no height or weight  on file to calculate BMI.  Exam:  . General: 85 y.o. year-old female well developed well nourished in no acute distress.  Alert and oriented x3. . Cardiovascular: Regular rate and rhythm with no rubs or gallops.  No thyromegaly or JVD noted.   Marland Kitchen Respiratory: Clear to auscultation with no wheezes or rales. Good inspiratory effort. . Abdomen: Soft nontender nondistended with normal bowel sounds x4 quadrants. . Musculoskeletal: No lower extremity edema. 2/4 pulses in all 4 extremities. . Skin: No ulcerative lesions noted or rashes, . Psychiatry: Mood is appropriate for condition and setting    Data Reviewed: CBC: Recent Labs  Lab 07/03/19 2301  WBC 8.9  NEUTROABS 6.5  HGB 10.7*  HCT 30.7*  MCV 100.7*  PLT 676   Basic Metabolic Panel: Recent Labs  Lab 07/03/19 2301  NA 130*  K 4.6  CL 99  CO2 22  GLUCOSE 110*  BUN 12  CREATININE 1.06*  CALCIUM 8.9   GFR: CrCl cannot be calculated (Unknown ideal weight.). Liver Function Tests: No results for input(s): AST, ALT, ALKPHOS, BILITOT, PROT, ALBUMIN in the last 168 hours. No results for input(s): LIPASE, AMYLASE in the last 168 hours. No results for input(s): AMMONIA in the last 168 hours. Coagulation Profile: No results for input(s): INR, PROTIME in the last 168 hours. Cardiac Enzymes: No results for input(s): CKTOTAL, CKMB, CKMBINDEX, TROPONINI in the last 168 hours. BNP (last 3 results) No results for input(s): PROBNP in the last 8760 hours. HbA1C: No results for input(s): HGBA1C in the last 72 hours. CBG: No results for input(s): GLUCAP in the last 168 hours. Lipid Profile: No results for input(s): CHOL, HDL, LDLCALC, TRIG, CHOLHDL, LDLDIRECT in the last 72 hours. Thyroid Function Tests: No results for input(s): TSH, T4TOTAL, FREET4, T3FREE, THYROIDAB in the last 72 hours. Anemia Panel: No results for input(s): VITAMINB12, FOLATE, FERRITIN, TIBC, IRON, RETICCTPCT in the last 72 hours. Urine analysis:     Component Value Date/Time   COLORURINE YELLOW 07/04/2019 0159   APPEARANCEUR CLEAR 07/04/2019 0159   LABSPEC 1.004 (L) 07/04/2019 0159   PHURINE 6.0 07/04/2019 0159   GLUCOSEU NEGATIVE 07/04/2019 0159   HGBUR NEGATIVE 07/04/2019 0159   BILIRUBINUR NEGATIVE 07/04/2019 0159   KETONESUR NEGATIVE 07/04/2019 0159   PROTEINUR NEGATIVE 07/04/2019 0159   NITRITE POSITIVE (A) 07/04/2019 0159   LEUKOCYTESUR NEGATIVE 07/04/2019 0159   Sepsis Labs: @LABRCNTIP (procalcitonin:4,lacticidven:4)  ) Recent Results (from the past 240 hour(s))  SARS CORONAVIRUS 2 (TAT 6-24 HRS) Nasopharyngeal Nasopharyngeal Swab     Status: None   Collection Time: 07/03/19 11:01 PM   Specimen: Nasopharyngeal Swab  Result Value Ref Range Status   SARS Coronavirus 2 NEGATIVE NEGATIVE Final    Comment: (NOTE) SARS-CoV-2 target nucleic acids are NOT DETECTED. The SARS-CoV-2 RNA is generally detectable in upper and lower respiratory specimens during the acute phase of infection. Negative results do not preclude SARS-CoV-2 infection, do not rule out co-infections with other pathogens, and should not be used as the sole basis for  treatment or other patient management decisions. Negative results must be combined with clinical observations, patient history, and epidemiological information. The expected result is Negative. Fact Sheet for Patients: HairSlick.nohttps://www.fda.gov/media/138098/download Fact Sheet for Healthcare Providers: quierodirigir.comhttps://www.fda.gov/media/138095/download This test is not yet approved or cleared by the [place] FDA and  has been authorized for detection and/or diagnosis of SARS-CoV-2 by FDA under an Emergency Use Authorization (EUA). This EUA will remain  in effect (meaning this test can be used) for the duration of the COVID-19 declaration under Section 56 4(b)(1) of the Act, 21 U.S.C. section 360bbb-3(b)(1), unless the authorization is terminated or revoked sooner. Performed at [facility], [address] [place], [place] [address]       Studies: No results found.  Scheduled Meds: . docusate sodium  100 mg Oral Daily  . enoxaparin (LOVENOX) injection  40 mg Subcutaneous Q24H  . folic acid  1 mg Oral Daily  . metoprolol succinate  50 mg Oral QPM  . polyethylene glycol  17 g Oral Daily  . spironolactone  25 mg Oral Daily    Continuous Infusions:   LOS: 1 day     Myrtie NeitherNwannadiya Sinjin Amero, MD [facility]  To reach me or the doctor on call, go to: [email or web address] Password [email or web address]  07/05/2019, 8:52 AM

## 2019-07-05 NOTE — Plan of Care (Signed)
  Problem: Education: Goal: Knowledge of General Education information will improve Description: Including pain rating scale, medication(s)/side effects and non-pharmacologic comfort measures Outcome: Progressing   Problem: Activity: Goal: Risk for activity intolerance will decrease Outcome: Progressing   Problem: Nutrition: Goal: Adequate nutrition will be maintained Outcome: Progressing   Problem: Elimination: Goal: Will not experience complications related to urinary retention Outcome: Progressing   Problem: Safety: Goal: Ability to remain free from injury will improve Outcome: Progressing   Problem: Coping: Goal: Level of anxiety will decrease Outcome: Not Progressing

## 2019-07-06 ENCOUNTER — Inpatient Hospital Stay (HOSPITAL_COMMUNITY): Payer: Medicare Other

## 2019-07-06 LAB — URINE CULTURE: Culture: >=100000 — AB

## 2019-07-06 MED ORDER — OXYCODONE-ACETAMINOPHEN 5-325 MG PO TABS
1.0000 | ORAL_TABLET | ORAL | Status: DC | PRN
  Administered 2019-07-06 – 2019-07-12 (×6): 1 via ORAL
  Filled 2019-07-06 (×7): qty 1

## 2019-07-06 MED ORDER — CEFAZOLIN SODIUM-DEXTROSE 1-4 GM/50ML-% IV SOLN
1.0000 g | Freq: Three times a day (TID) | INTRAVENOUS | Status: DC
  Administered 2019-07-06 – 2019-07-09 (×8): 1 g via INTRAVENOUS
  Filled 2019-07-06 (×9): qty 50

## 2019-07-06 MED ORDER — MORPHINE SULFATE (PF) 2 MG/ML IV SOLN
2.0000 mg | INTRAVENOUS | Status: DC | PRN
  Administered 2019-07-06: 12:00:00 2 mg via INTRAVENOUS
  Filled 2019-07-06: qty 1

## 2019-07-06 MED ORDER — LISINOPRIL 20 MG PO TABS
20.0000 mg | ORAL_TABLET | Freq: Every day | ORAL | Status: DC
  Administered 2019-07-06 – 2019-07-12 (×7): 20 mg via ORAL
  Filled 2019-07-06 (×7): qty 1

## 2019-07-06 MED ORDER — AMLODIPINE BESYLATE 5 MG PO TABS
5.0000 mg | ORAL_TABLET | Freq: Every day | ORAL | Status: DC
  Administered 2019-07-06 – 2019-07-10 (×5): 5 mg via ORAL
  Filled 2019-07-06 (×6): qty 1

## 2019-07-06 NOTE — Progress Notes (Signed)
Patients BP remains elevated even with PRN Labetalol given. MD notified and daily amlodipine ordered.

## 2019-07-06 NOTE — Progress Notes (Addendum)
PROGRESS NOTE  Alicia Schwartz ZOX:[id number] DOB: 12/26/1930 DOA: 07/03/2019 PCP: Aida PufferLittle, James, MD  HPI/Recap of past 24 hours:  This is an 85 year old female with history of hypertension, paroxysmal tachycardia, chronic kidney disease stage III who was brought to the emergency department for the second time in 1 week because of worsening back pain.  Apparently a week ago patient was bending to do something and then she felt a pop in her mid back and started having severe pain she was seen at the emergency department a CT scan was done which showed T12 endplate fracture and advised to stop brace and follow-up with neurosurgery on June 29, 2019.  She returns to the emergency department because of pain has not recently worsened.  Subjective: Patient seen and examined at bedside with RN, she continues to complain of severe back pain even at rest and much worse with ambulation. She was seen by PT who recommends brace and SNF level of care.  Assessment/Plan: Principal Problem:   Mid back pain Active Problems:   Paroxysmal supraventricular tachycardia (HCC)   Closed T12 fracture (HCC)   Hypertensive urgency   Back pain   Pressure injury of skin  1.  Severe mid back pain secondary to close compression fracture of T12.  Patient is not able to wear her back brace because of pain, so I have consulted IR for possible intervention likely kypho. She is not on blood thinners, and we will get Lspine MRI today. RN notified next of kin who are agreeable to Kyphoplasty if felt to be indicated by IR.  2.  Hypertension urgency blood pressure was in the 200s earlier, likely I think due to pain, was started on daily Norvasc in addition to her home meds. BP better not perfect but I think this is related to pain so will not increase more for now.  3.  Paroxysmal supraventricular tachycardia.  Continue metoprolol  4.  Chronic kidney disease stage III.  Creatinine seems to be at baseline.  5.  Urinary  tract infection patient will be started on ceftriaxone due to her kidney function  6.  Constipation patient was given MiraLAX.  And she is having some loose stool nurse advised to check for impaction with seeping of stool or watery stool around the hard stool  Code Status: DNR  Severity of Illness: The appropriate patient status for this patient is INPATIENT. Inpatient status is judged to be reasonable and necessary in order to provide the required intensity of service to ensure the patient's safety. The patient's presenting symptoms, physical exam findings, and initial radiographic and laboratory data in the context of their chronic comorbidities is felt to place them at high risk for further clinical deterioration. Furthermore, it is not anticipated that the patient will be medically stable for discharge from the hospital within 2 midnights of admission. The following factors support the patient status of inpatient.   " The patient's presenting symptoms include severe low back pain Patient has fracture of the lumbar spine T12  * I certify that at the point of admission it is my clinical judgment that the patient will require inpatient hospital care spanning beyond 2 midnights from the point of admission due to high intensity of service, high risk for further deterioration and high frequency of surveillance required.*   Family Communication RN has spoken with family this AM and notified about possibility of kyphoplasty after MRI.  Disposition Plan: To be determined after kyphoplasty, possibly SNF.  Consultants:  IR  Procedures:  None  Antimicrobials:  None  DVT prophylaxis: Lovenox  Objective: Vitals:   07/06/19 0042 07/06/19 0428 07/06/19 0548 07/06/19 0802  BP: (!) 179/67 (!) 191/55 (!) 197/62 (!) 146/52  Pulse: 67 64 64 66  Resp:  15  18  Temp:  99.3 F (37.4 C)  98.5 F (36.9 C)  TempSrc:  Axillary  Oral  SpO2: 96% 96%  96%    Intake/Output Summary (Last 24 hours)  at 07/06/2019 1050 Last data filed at 07/06/2019 0900 Gross per 24 hour  Intake 580 ml  Output 1000 ml  Net -420 ml   There were no vitals filed for this visit. There is no height or weight on file to calculate BMI.  Exam:  . General: 85 y.o. year-old female well developed well nourished in no acute distress but upset when talking about her back pain.  Alert and oriented x3. . Cardiovascular: Regular rate and rhythm with no rubs or gallops.  No thyromegaly or JVD noted.   Marland Kitchen Respiratory: Clear to auscultation with no wheezes or rales. Good inspiratory effort. . Abdomen: Soft nontender nondistended with normal bowel sounds x4 quadrants. . Musculoskeletal: No lower extremity edema. 2/4 pulses in all 4 extremities. . Skin: No ulcerative lesions noted or rashes, . Psychiatry: Mood is appropriate for condition and setting  Data Reviewed: CBC: Recent Labs  Lab 07/03/19 2301  WBC 8.9  NEUTROABS 6.5  HGB 10.7*  HCT 30.7*  MCV 100.7*  PLT 654   Basic Metabolic Panel: Recent Labs  Lab 07/03/19 2301  NA 130*  K 4.6  CL 99  CO2 22  GLUCOSE 110*  BUN 12  CREATININE 1.06*  CALCIUM 8.9   GFR: CrCl cannot be calculated (Unknown ideal weight.). Liver Function Tests: No results for input(s): AST, ALT, ALKPHOS, BILITOT, PROT, ALBUMIN in the last 168 hours. No results for input(s): LIPASE, AMYLASE in the last 168 hours. No results for input(s): AMMONIA in the last 168 hours. Coagulation Profile: No results for input(s): INR, PROTIME in the last 168 hours. Cardiac Enzymes: No results for input(s): CKTOTAL, CKMB, CKMBINDEX, TROPONINI in the last 168 hours. BNP (last 3 results) No results for input(s): PROBNP in the last 8760 hours. HbA1C: No results for input(s): HGBA1C in the last 72 hours. CBG: No results for input(s): GLUCAP in the last 168 hours. Lipid Profile: No results for input(s): CHOL, HDL, LDLCALC, TRIG, CHOLHDL, LDLDIRECT in the last 72 hours. Thyroid Function  Tests: No results for input(s): TSH, T4TOTAL, FREET4, T3FREE, THYROIDAB in the last 72 hours. Anemia Panel: No results for input(s): VITAMINB12, FOLATE, FERRITIN, TIBC, IRON, RETICCTPCT in the last 72 hours. Urine analysis:    Component Value Date/Time   COLORURINE YELLOW 07/04/2019 0159   APPEARANCEUR CLEAR 07/04/2019 0159   LABSPEC 1.004 (L) 07/04/2019 0159   PHURINE 6.0 07/04/2019 0159   GLUCOSEU NEGATIVE 07/04/2019 0159   HGBUR NEGATIVE 07/04/2019 0159   BILIRUBINUR NEGATIVE 07/04/2019 0159   KETONESUR NEGATIVE 07/04/2019 0159   PROTEINUR NEGATIVE 07/04/2019 0159   NITRITE POSITIVE (A) 07/04/2019 0159   LEUKOCYTESUR NEGATIVE 07/04/2019 0159   Sepsis Labs: @LABRCNTIP (procalcitonin:4,lacticidven:4)  ) Recent Results (from the past 240 hour(s))  SARS CORONAVIRUS 2 (TAT 6-24 HRS) Nasopharyngeal Nasopharyngeal Swab     Status: None   Collection Time: 07/03/19 11:01 PM   Specimen: Nasopharyngeal Swab  Result Value Ref Range Status   SARS Coronavirus 2 NEGATIVE NEGATIVE Final    Comment: (NOTE) SARS-CoV-2 target nucleic acids are NOT DETECTED. The  SARS-CoV-2 RNA is generally detectable in upper and lower respiratory specimens during the acute phase of infection. Negative results do not preclude SARS-CoV-2 infection, do not rule out co-infections with other pathogens, and should not be used as the sole basis for treatment or other patient management decisions. Negative results must be combined with clinical observations, patient history, and epidemiological information. The expected result is Negative. Fact Sheet for Patients: HairSlick.no Fact Sheet for Healthcare Providers: quierodirigir.com This test is not yet approved or cleared by the [place] FDA and  has been authorized for detection and/or diagnosis of SARS-CoV-2 by FDA under an Emergency Use Authorization (EUA). This EUA will remain  in effect (meaning this  test can be used) for the duration of the COVID-19 declaration under Section 56 4(b)(1) of the Act, 21 U.S.C. section 360bbb-3(b)(1), unless the authorization is terminated or revoked sooner. Performed at [facility], [address], [place], [place] [address]   Urine culture     Status: Abnormal   Collection Time: 07/04/19  3:50 AM   Specimen: Urine, Random  Result Value Ref Range Status   Specimen Description URINE, RANDOM  Final   Special Requests   Final    NONE Performed at [facility], [address] [place], [place] [address]    Culture >=100,000 COLONIES/mL ESCHERICHIA COLI (A)  Final   Report Status 07/06/2019 FINAL  Final   Organism ID, Bacteria ESCHERICHIA COLI (A)  Final      Susceptibility   Escherichia coli - MIC*    AMPICILLIN <=2 SENSITIVE Sensitive     CEFAZOLIN <=4 SENSITIVE Sensitive     CEFTRIAXONE <=1 SENSITIVE Sensitive     CIPROFLOXACIN <=0.25 SENSITIVE Sensitive     GENTAMICIN <=1 SENSITIVE Sensitive     IMIPENEM <=0.25 SENSITIVE Sensitive     NITROFURANTOIN <=16 SENSITIVE Sensitive     TRIMETH/SULFA <=20 SENSITIVE Sensitive     AMPICILLIN/SULBACTAM <=2 SENSITIVE Sensitive     PIP/TAZO <=4 SENSITIVE Sensitive     * >=100,000 COLONIES/mL ESCHERICHIA COLI     Studies: No results found.  Scheduled Meds: . amLODipine  5 mg Oral Daily  . docusate sodium  100 mg Oral Daily  . enoxaparin (LOVENOX) injection  40 mg Subcutaneous Q24H  . folic acid  1 mg Oral Daily  . lisinopril  20 mg Oral Daily  . metoprolol succinate  50 mg Oral QPM  . polyethylene glycol  17 g Oral Daily  . spironolactone  25 mg Oral Daily   Continuous Infusions: . cefTRIAXone (ROCEPHIN)  IV 1 g (07/06/19 1030)     LOS: 2 days   Sha Burling Vergie Living, MD [facility]  To reach me or the doctor on call, go to: [email or web address] Password [email or web address]  07/06/2019, 10:50 AM

## 2019-07-06 NOTE — Plan of Care (Signed)
  Problem: Education: Goal: Knowledge of General Education information will improve Description: Including pain rating scale, medication(s)/side effects and non-pharmacologic comfort measures Outcome: Progressing   Problem: Clinical Measurements: Goal: Will remain free from infection Outcome: Progressing Goal: Cardiovascular complication will be avoided Outcome: Progressing   Problem: Activity: Goal: Risk for activity intolerance will decrease Outcome: Progressing   Problem: Coping: Goal: Level of anxiety will decrease Outcome: Progressing   Problem: Pain Managment: Goal: General experience of comfort will improve Outcome: Progressing   Problem: Safety: Goal: Ability to remain free from injury will improve Outcome: Progressing   Problem: Skin Integrity: Goal: Risk for impaired skin integrity will decrease Outcome: Progressing   

## 2019-07-06 NOTE — Care Management Note (Signed)
Case Management Note  Patient Details  Name: Alicia Schwartz MRN: [id number] Date of Birth: Sep 21, 1930  CM spoke with [name] who advised she would like the pt referred to [facility] for SNF rehab.  She reports the pt lives alone and she had come to stay with her after her back injury but has required too much care considering [name] also takes care of her mother and boyfriend with a recent procedure.  CM completed FL2 for provider to sign and sent pt's information to [facility]. [employer] authorization process started.  Rae Mar, RN 07/06/2019, 1:19 PM

## 2019-07-06 NOTE — Care Management Note (Signed)
Case Management Note  Patient Details  Name: Alicia Schwartz MRN: [id number] Date of Birth: 01-Mar-1931  CM left a message with [name]'s boyfriend Danise Edge) for a return phone call.  Rae Mar, RN 07/06/2019, 12:02 PM

## 2019-07-06 NOTE — Plan of Care (Signed)
  Problem: Education: Goal: Knowledge of General Education information will improve Description: Including pain rating scale, medication(s)/side effects and non-pharmacologic comfort measures Outcome: Progressing   Problem: Health Behavior/Discharge Planning: Goal: Ability to manage health-related needs will improve Outcome: Progressing   Problem: Activity: Goal: Risk for activity intolerance will decrease Outcome: Progressing   Problem: Pain Managment: Goal: General experience of comfort will improve Outcome: Not Progressing   Problem: Safety: Goal: Ability to remain free from injury will improve Outcome: Progressing

## 2019-07-06 NOTE — [place] FL2 (Addendum)
[place] MEDICAID FL2 LEVEL OF CARE SCREENING TOOL     IDENTIFICATION  Patient Name: Alicia Schwartz Birthdate: 1931-01-14 Sex: female Admission Date (Current Location): 07/03/2019  [facility] and [place] Number:  Producer, television/film/video and Address:  [facility], [address] [place], [place] [address]      Provider Number:    Attending Physician Name and Address:  Kirby Crigler, [facility]*  Relative Name and Phone Number:  Emeline Darling (niece) [phone]    Current Level of Care: Hospital Recommended Level of Care: Skilled Nursing Facility Prior Approval Number:    Date Approved/Denied:   PASRR Number: [id number]  Discharge Plan: SNF    Current Diagnoses: Patient Active Problem List   Diagnosis Date Noted  . Mid back pain 07/04/2019  . Closed T12 fracture (HCC) 07/04/2019  . Hypertensive urgency 07/04/2019  . Back pain 07/04/2019  . Pressure injury of skin 07/04/2019  . Postoperative anemia due to acute blood loss 02/14/2015  . Paroxysmal supraventricular tachycardia (HCC) 02/12/2015  . Fracture, subtrochanteric, right femur, closed (HCC) 02/11/2015  . Fall due to stumbling 02/11/2015  . Anxiety state 09/19/2006  . HYPERTENSION, BENIGN SYSTEMIC 09/19/2006    Orientation RESPIRATION BLADDER Height & Weight     Place, Self, Time, Situation  Normal Incontinent Weight:   Height:     BEHAVIORAL SYMPTOMS/MOOD NEUROLOGICAL BOWEL NUTRITION STATUS      Incontinent Diet  AMBULATORY STATUS COMMUNICATION OF NEEDS Skin   Extensive Assist Verbally PU Stage and Appropriate Care PU Stage 1 Dressing: (PRN)                     Personal Care Assistance Level of Assistance  Bathing, Dressing Bathing Assistance: Maximum assistance Feeding assistance: Limited assistance Dressing Assistance: Maximum assistance Total Care Assistance: Maximum assistance   Functional Limitations Info  Sight, Hearing Sight Info: Impaired Hearing Info:  Impaired      SPECIAL CARE FACTORS FREQUENCY  PT (By licensed PT), OT (By licensed OT)     PT Frequency: 5 OT Frequency: 5            Contractures Contractures Info: Not present    Additional Factors Info  Code Status Code Status Info: DNR             Current Medications (07/06/2019):  This is the current hospital active medication list Current Facility-Administered Medications  Medication Dose Route Frequency Provider Last Rate Last Admin  . acetaminophen (TYLENOL) tablet 650 mg  650 mg Oral Q6H PRN Eduard Clos, MD   650 mg at 07/06/19 0053   Or  . acetaminophen (TYLENOL) suppository 650 mg  650 mg Rectal Q6H PRN Eduard Clos, MD      . amLODipine (NORVASC) tablet 5 mg  5 mg Oral Daily Rai, Ripudeep K, MD   5 mg at 07/06/19 0548  . cefTRIAXone (ROCEPHIN) 1 g in sodium chloride 0.9 % 100 mL IVPB  1 g Intravenous Q24H Myrtie Neither, MD 200 mL/hr at 07/06/19 1030 1 g at 07/06/19 1030  . docusate sodium (COLACE) capsule 100 mg  100 mg Oral Daily Kyere, Belinda K, NP      . enoxaparin (LOVENOX) injection 40 mg  40 mg Subcutaneous Q24H Eduard Clos, MD   40 mg at 07/06/19 1029  . folic acid (FOLVITE) tablet 1 mg  1 mg Oral Daily Eduard Clos, MD   1 mg at 07/06/19 1039  . labetalol (NORMODYNE) injection 10  mg  10 mg Intravenous Q2H PRN Rise Patience, MD   10 mg at 07/06/19 0443  . lisinopril (ZESTRIL) tablet 20 mg  20 mg Oral Daily Rai, Ripudeep K, MD   20 mg at 07/06/19 1038  . metoprolol succinate (TOPROL-XL) 24 hr tablet 50 mg  50 mg Oral QPM Rise Patience, MD   50 mg at 07/05/19 1722  . morphine 2 MG/ML injection 2 mg  2 mg Intravenous Q4H PRN Tomma Rakers, MD   2 mg at 07/06/19 1134  . ondansetron (ZOFRAN) tablet 4 mg  4 mg Oral Q6H PRN Rise Patience, MD       Or  . ondansetron [facility]) injection 4 mg  4 mg Intravenous Q6H PRN Rise Patience, MD      . oxyCODONE-acetaminophen (PERCOCET/ROXICET)  5-325 MG per tablet 1 tablet  1 tablet Oral Q4H PRN Hollice Gong, Mir Mohammed, MD      . polyethylene glycol (MIRALAX / GLYCOLAX) packet 17 g  17 g Oral Daily Kyere, Belinda K, NP      . senna (SENOKOT) tablet 8.6 mg  1 tablet Oral BID PRN Hollace Hayward K, NP      . spironolactone (ALDACTONE) tablet 25 mg  25 mg Oral Daily Rise Patience, MD   25 mg at 07/06/19 1038     Discharge Medications: Please see discharge summary for a list of discharge medications.  Relevant Imaging Results:  Relevant Lab Results:   Additional Information    Rae Mar, RN

## 2019-07-06 NOTE — Care Management Note (Addendum)
Case Management Note  Patient Details  Name: Alicia Schwartz MRN: [id number] Date of Birth: 1930/12/26   CM spoke with Juliann Pulse at [facility] who advised they would have a bed available for this pt when medically ready.  [place] advised clinicals were received and are under review.  TOC team will continue to follow.  Rae Mar, RN 07/06/2019, 3:22 PM

## 2019-07-07 NOTE — Progress Notes (Signed)
PROGRESS NOTE  Alicia Schwartz OIZ:[id number] DOB: [birth year] DOA: 07/03/2019 PCP: Tamsen Roers, MD  HPI/Recap of past 24 hours:  This is an 85 year old female with history of hypertension, paroxysmal tachycardia, chronic kidney disease stage III who was brought to the emergency department for the second time in 1 week because of worsening back pain.  Apparently a week ago patient was bending to do something and then she felt a pop in her mid back and started having severe pain she was seen at the emergency department a CT scan was done which showed T12 endplate fracture and advised to stop brace and follow-up with neurosurgery on June 29, 2019.  She returns to the emergency department because of pain has not recently worsened, had Lumbar MRI 12/14 that confirms acute compression fracture T12 with further loss of height.  Subjective: Patient seen and examined, she continues to complain of severe back pain even at rest and much worse with ambulation. She had MRI yesterday and I spoke with IR.   Assessment/Plan: Principal Problem:   Mid back pain Active Problems:   Paroxysmal supraventricular tachycardia (HCC)   Closed T12 fracture (HCC)   Hypertensive urgency   Back pain   Pressure injury of skin  1.  Severe mid back pain secondary to close compression fracture of T12.  Patient is not able to wear her back brace because of pain, so I have consulted IR for possible intervention likely kypho. She is not on blood thinners, and had Lspine MRI today. RN notified next of kin who are agreeable to Kyphoplasty if felt to be indicated by IR. RN to contact IR this AM to see if she is on schedule, etc. I did speak with IR MD yesterday.  2.  Hypertension urgency blood pressure was in the 200s earlier, likely I think due to pain, was started on daily Norvasc in addition to her home meds. BP better not perfect but I think this is related to pain so will not increase more for now.  3.  Paroxysmal  supraventricular tachycardia.  Continue metoprolol  4.  Chronic kidney disease stage III.  Creatinine seems to be at baseline.  5.  Urinary tract infection patient will be started on ceftriaxone due to her kidney function  6.  Constipation patient was given MiraLAX.  And she is having some loose stool nurse advised to check for impaction with seeping of stool or watery stool around the hard stool  Code Status: DNR  Severity of Illness: The appropriate patient status for this patient is INPATIENT. Inpatient status is judged to be reasonable and necessary in order to provide the required intensity of service to ensure the patient's safety. The patient's presenting symptoms, physical exam findings, and initial radiographic and laboratory data in the context of their chronic comorbidities is felt to place them at high risk for further clinical deterioration. Furthermore, it is not anticipated that the patient will be medically stable for discharge from the hospital within 2 midnights of admission. The following factors support the patient status of inpatient.   " The patient's presenting symptoms include severe low back pain Patient has fracture of the lumbar spine P38  * I certify that at the point of admission it is my clinical judgment that the patient will require inpatient hospital care spanning beyond 2 midnights from the point of admission due to high intensity of service, high risk for further deterioration and high frequency of surveillance required.*   Family Communication RN has spoken  with family this AM and notified about possibility of kyphoplasty after MRI.  Disposition Plan: To be determined after kyphoplasty, possibly SNF.  Consultants:  IR  Procedures:  None  Antimicrobials:  None  DVT prophylaxis: Lovenox  Objective: Vitals:   07/07/19 0337 07/07/19 0634 07/07/19 0818 07/07/19 0935  BP: (!) 194/56 (!) 174/60 (!) 163/51 (!) 143/93  Pulse: 64 62 61   Resp: 15   14   Temp: 97.7 F (36.5 C)     TempSrc:      SpO2: 98% 97% 98%     Intake/Output Summary (Last 24 hours) at 07/07/2019 1114 Last data filed at 07/07/2019 0551 Gross per 24 hour  Intake 410 ml  Output 850 ml  Net -440 ml   There were no vitals filed for this visit. There is no height or weight on file to calculate BMI.  Exam:  . General: 85 y.o. year-old female well developed well nourished in no acute distress but upset when talking about her back pain.  Alert and oriented x3. . Cardiovascular: Regular rate and rhythm with no rubs or gallops.  No thyromegaly or JVD noted.   Marland Kitchen Respiratory: Clear to auscultation with no wheezes or rales. Good inspiratory effort. . Abdomen: Soft nontender nondistended with normal bowel sounds x4 quadrants. . Musculoskeletal: No lower extremity edema. 2/4 pulses in all 4 extremities. . Skin: No ulcerative lesions noted or rashes, . Psychiatry: Mood is appropriate for condition and setting  Data Reviewed: CBC: Recent Labs  Lab 07/03/19 2301  WBC 8.9  NEUTROABS 6.5  HGB 10.7*  HCT 30.7*  MCV 100.7*  PLT 237   Basic Metabolic Panel: Recent Labs  Lab 07/03/19 2301  NA 130*  K 4.6  CL 99  CO2 22  GLUCOSE 110*  BUN 12  CREATININE 1.06*  CALCIUM 8.9   GFR: CrCl cannot be calculated (Unknown ideal weight.). Liver Function Tests: No results for input(s): AST, ALT, ALKPHOS, BILITOT, PROT, ALBUMIN in the last 168 hours. No results for input(s): LIPASE, AMYLASE in the last 168 hours. No results for input(s): AMMONIA in the last 168 hours. Coagulation Profile: No results for input(s): INR, PROTIME in the last 168 hours. Cardiac Enzymes: No results for input(s): CKTOTAL, CKMB, CKMBINDEX, TROPONINI in the last 168 hours. BNP (last 3 results) No results for input(s): PROBNP in the last 8760 hours. HbA1C: No results for input(s): HGBA1C in the last 72 hours. CBG: No results for input(s): GLUCAP in the last 168 hours. Lipid Profile: No  results for input(s): CHOL, HDL, LDLCALC, TRIG, CHOLHDL, LDLDIRECT in the last 72 hours. Thyroid Function Tests: No results for input(s): TSH, T4TOTAL, FREET4, T3FREE, THYROIDAB in the last 72 hours. Anemia Panel: No results for input(s): VITAMINB12, FOLATE, FERRITIN, TIBC, IRON, RETICCTPCT in the last 72 hours. Urine analysis:    Component Value Date/Time   COLORURINE YELLOW 07/04/2019 0159   APPEARANCEUR CLEAR 07/04/2019 0159   LABSPEC 1.004 (L) 07/04/2019 0159   PHURINE 6.0 07/04/2019 0159   GLUCOSEU NEGATIVE 07/04/2019 0159   HGBUR NEGATIVE 07/04/2019 0159   BILIRUBINUR NEGATIVE 07/04/2019 0159   KETONESUR NEGATIVE 07/04/2019 0159   PROTEINUR NEGATIVE 07/04/2019 0159   NITRITE POSITIVE (A) 07/04/2019 0159   LEUKOCYTESUR NEGATIVE 07/04/2019 0159   Sepsis Labs: (procalcitonin:4,lacticidven:4)  ) Recent Results (from the past 240 hour(s))  SARS CORONAVIRUS 2 (TAT 6-24 HRS) Nasopharyngeal Nasopharyngeal Swab     Status: None   Collection Time: 07/03/19 11:01 PM   Specimen: Nasopharyngeal Swab  Result Value  Ref Range Status   SARS Coronavirus 2 NEGATIVE NEGATIVE Final    Comment: (NOTE) SARS-CoV-2 target nucleic acids are NOT DETECTED. The SARS-CoV-2 RNA is generally detectable in upper and lower respiratory specimens during the acute phase of infection. Negative results do not preclude SARS-CoV-2 infection, do not rule out co-infections with other pathogens, and should not be used as the sole basis for treatment or other patient management decisions. Negative results must be combined with clinical observations, patient history, and epidemiological information. The expected result is Negative. Fact Sheet for Patients: HairSlick.no Fact Sheet for Healthcare Providers: quierodirigir.com This test is not yet approved or cleared by the [place] FDA and  has been authorized for detection and/or diagnosis of  SARS-CoV-2 by FDA under an Emergency Use Authorization (EUA). This EUA will remain  in effect (meaning this test can be used) for the duration of the COVID-19 declaration under Section 56 4(b)(1) of the Act, 21 U.S.C. section 360bbb-3(b)(1), unless the authorization is terminated or revoked sooner. Performed at [facility], [address], [place], [place] [address]   Urine culture     Status: Abnormal   Collection Time: 07/04/19  3:50 AM   Specimen: Urine, Random  Result Value Ref Range Status   Specimen Description URINE, RANDOM  Final   Special Requests   Final    NONE Performed at [facility], [address], [place], [place] [address]    Culture >=100,000 COLONIES/mL ESCHERICHIA COLI (A)  Final   Report Status 07/06/2019 FINAL  Final   Organism ID, Bacteria ESCHERICHIA COLI (A)  Final      Susceptibility   Escherichia coli - MIC*    AMPICILLIN <=2 SENSITIVE Sensitive     CEFAZOLIN <=4 SENSITIVE Sensitive     CEFTRIAXONE <=1 SENSITIVE Sensitive     CIPROFLOXACIN <=0.25 SENSITIVE Sensitive     GENTAMICIN <=1 SENSITIVE Sensitive     IMIPENEM <=0.25 SENSITIVE Sensitive     NITROFURANTOIN <=16 SENSITIVE Sensitive     TRIMETH/SULFA <=20 SENSITIVE Sensitive     AMPICILLIN/SULBACTAM <=2 SENSITIVE Sensitive     PIP/TAZO <=4 SENSITIVE Sensitive     * >=100,000 COLONIES/mL ESCHERICHIA COLI     Studies: MR LUMBAR SPINE WO CONTRAST  Result Date: 07/06/2019 CLINICAL DATA:  Lumbar region back pain EXAM: MRI LUMBAR SPINE WITHOUT CONTRAST TECHNIQUE: Multiplanar, multisequence MR imaging of the lumbar spine was performed. No intravenous contrast was administered. COMPARISON:  CT 06/28/2019 FINDINGS: Segmentation: 5 lumbar type vertebral bodies as numbered previously. Alignment: Increased kyphotic curvature at the thoracolumbar junction region due to fractures. 3 mm degenerative anterolisthesis L4-5. 1 mm degenerative anterolisthesis L5-S1. Vertebrae: Progression of the acute  fracture at T12 compared to the prior CT. Loss of height now of 60%. Posterior bowing of the posterior margin of the vertebral body by 4 mm. Persistent marrow space edema. Old healed superior endplate fracture at L1, inferior endplate fracture at L2 and generalized vertebral body loss of height at L5 appear unchanged, without any edema. No evidence of sacral fracture. Conus medullaris and cauda equina: Conus extends to the L1-2 level. Conus and cauda equina appear normal. Paraspinal and other soft tissues: Chololithiasis. Disc levels: T11-12: No traumatic disc herniation. At T12, there is canal stenosis because of 4 mm posterior bowing of the superior endplate of T12. This effaces the subarachnoid space and indents the cord slightly. The T12-L1 disc does not show bulge or herniation. L1-2: Disc degeneration with shallow protrusion and slight caudal migration  in the midline. Facet and ligamentous prominence. Spinal stenosis at this level with effacement of the subarachnoid space and crowding of the nerve roots of the cauda equina. L2-3: Bulging of the disc. Mild stenosis of both lateral recesses but no definite neural compression. L3-4: Interspace. L4-5: Chronic facet arthropathy with 3 mm of anterolisthesis. Bulging of the disc. Stenosis of both lateral recesses and proximal foramina. Some potential for neural compression at this level. L5-S1: Facet osteoarthritis with 1 mm of anterolisthesis. No disc herniation. No compressive stenosis. Edema between the spinous processes of L2-3 and L3-4 indicate abutment and could contribute to low back pain. IMPRESSION: Progression of the fracture at T12 since the CT study of 8 days ago. Loss of height has worsened from 30% to about 60%. Edema associated with the recent/active nature of the process. This looks like a benign fracture. Posterior bowing of the posterosuperior margin of the vertebral body with narrowing of the canal. Slight indentation of the cord without frank cord  compression. Old healed superior endplate fracture at L1, inferior endplate fracture at L2 and generalized vertebral body height loss at L5. No residual edema at those levels. Multifactorial stenosis at L1-2 with effacement of the subarachnoid space and crowding of the nerve roots of the cauda equina. Bilateral lateral recess stenosis at L4-5 that could be symptomatic. Facet osteoarthritis at L4-5 and L5-S1 could also contribute to low back pain. Chololithiasis. Abutment changes between the spinous processes at L2-3 and L3-4 could contribute to back pain. Electronically Signed   By: Paulina FusiMark  Shogry M.D.   On: 07/06/2019 13:23    Scheduled Meds: . amLODipine  5 mg Oral Daily  . docusate sodium  100 mg Oral Daily  . enoxaparin (LOVENOX) injection  40 mg Subcutaneous Q24H  . folic acid  1 mg Oral Daily  . lisinopril  20 mg Oral Daily  . metoprolol succinate  50 mg Oral QPM  . polyethylene glycol  17 g Oral Daily  . spironolactone  25 mg Oral Daily   Continuous Infusions: .  ceFAZolin (ANCEF) IV 1 g (07/07/19 0626)     LOS: 3 days   Salathiel Ferrara Vergie LivingMohammed Jovana Rembold, MD [facility]  To reach me or the doctor on call, go to: [email or web address] Password [email or web address]  07/07/2019, 11:14 AM

## 2019-07-07 NOTE — Plan of Care (Signed)
  Problem: Education: Goal: Knowledge of General Education information will improve Description: Including pain rating scale, medication(s)/side effects and non-pharmacologic comfort measures 07/07/2019 0553 by Verdia Kuba, RN Outcome: Progressing 07/06/2019 2125 by Verdia Kuba, RN Outcome: Progressing   Problem: Health Behavior/Discharge Planning: Goal: Ability to manage health-related needs will improve Outcome: Progressing   Problem: Activity: Goal: Risk for activity intolerance will decrease 07/07/2019 0553 by Verdia Kuba, RN Outcome: Progressing 07/06/2019 2125 by Verdia Kuba, RN Outcome: Progressing   Problem: Pain Managment: Goal: General experience of comfort will improve 07/07/2019 0553 by Verdia Kuba, RN Outcome: Progressing 07/06/2019 2125 by Verdia Kuba, RN Outcome: Not Progressing   Problem: Safety: Goal: Ability to remain free from injury will improve 07/07/2019 0553 by Verdia Kuba, RN Outcome: Progressing 07/06/2019 2125 by Verdia Kuba, RN Outcome: Progressing   Problem: Skin Integrity: Goal: Risk for impaired skin integrity will decrease 07/07/2019 0553 by Verdia Kuba, RN Outcome: Progressing 07/06/2019 2125 by Verdia Kuba, RN Outcome: Progressing

## 2019-07-07 NOTE — Consult Note (Signed)
Chief Complaint: Patient was seen in consultation today for T12 compression fracture/vertebroplasty.  Referring Physician(s): Kirby Crigler, Mir Mohammed  Supervising Physician: Julieanne Cotton  Patient Status: [facility] - In-pt  History of Present Illness: Alicia Schwartz is a 85 y.o. female with a past medical history of hypertension and paroxysmal SVT. Of note, patient developed low back pain after lifting a table. She was seen at [facility] ED 06/28/2019 and was found to have a T12 compression fracture. She was discharged home with PO narcotics and neurosurgery outpatient follow-up. She then presented to [facility] ED 07/03/2019 with complaint of worsening back pain. She was admitted for pain control and PT/OT. MR lumbar spine revealed worsening compression fracture at T12 with associated height loss.  MR lumbar 07/06/2019: 1. Progression of the fracture at T12 since the CT study of 8 days ago. Loss of height has worsened from 30% to about 60%. Edema associated with the recent/active nature of the process. This looks like a benign fracture. Posterior bowing of the posterosuperior margin of the vertebral body with narrowing of the canal. Slight indentation of the cord without frank cord compression. 2. Old healed superior endplate fracture at L1, inferior endplate fracture at L2 and generalized vertebral body height loss at L5. No residual edema at those levels. 3. Multifactorial stenosis at L1-2 with effacement of the subarachnoid space and crowding of the nerve roots of the cauda equina. 4. Bilateral lateral recess stenosis at L4-5 that could be symptomatic. Facet osteoarthritis at L4-5 and L5-S1 could also contribute to low back pain. 5. Chololithiasis. 6. Abutment changes between the spinous processes at L2-3 and L3-4 could contribute to back pain.  IR requested by Dr.Ikramullah for possible image-guided T12 kyphoplasty/vertebroplasty. Patient awake and alert sitting in chair. She is alert and  oriented x3 and able to converse, however is extremely hard of hearing. History difficult to obtain due to [facility].   Past Medical History:  Diagnosis Date  . Fracture, subtrochanteric, right femur, closed (HCC) 02/11/2015  . Hypertension   . Paroxysmal supraventricular tachycardia (HCC) 02/12/2015    Past Surgical History:  Procedure Laterality Date  . ABDOMINAL HYSTERECTOMY    . INTRAMEDULLARY (IM) NAIL INTERTROCHANTERIC Right 02/12/2015   Procedure: INTRAMEDULLARY (IM) NAIL INTERTROCHANTRIC;  Surgeon: Teryl Lucy, MD;  Location: [facility] OR;  Service: Orthopedics;  Laterality: Right;    Allergies: Patient has no known allergies.  Medications: Prior to Admission medications   Medication Sig Start Date End Date Taking? Authorizing Provider  CALCIUM PO Take 1 tablet by mouth daily.   Yes [provider]  docusate sodium (COLACE) 100 MG capsule Take 1 capsule (100 mg total) by mouth 2 (two) times daily. 02/15/15  Yes Joseph Art, DO  folic acid (FOLVITE) 1 MG tablet Take 1 mg by mouth daily.   Yes [provider]  HYDROcodone-acetaminophen (NORCO) 5-325 MG tablet Take 1-2 tablets by mouth every 6 (six) hours as needed for moderate pain. MAXIMUM TOTAL ACETAMINOPHEN DOSE IS 4000 MG PER DAY 06/28/19  Yes Terrilee Files, MD  ibuprofen (ADVIL) 200 MG tablet Take 200 mg by mouth every 6 (six) hours as needed for moderate pain.   Yes [provider]  metoprolol succinate (TOPROL-XL) 50 MG 24 hr tablet Take 50 mg by mouth every evening.  06/23/19  Yes [provider]  Multiple Vitamins-Minerals (MULTIVITAMIN GUMMIES ADULT PO) Take 1 capsule by mouth daily.   Yes [provider]  spironolactone (ALDACTONE) 25 MG tablet Take 25 mg by mouth daily.  01/22/15  Yes [provider]  acetaminophen (TYLENOL) 325 MG tablet Take 2 tablets (650 mg total) by mouth every 6 (six) hours as needed for mild pain (or Fever >/= 101). Patient not taking: Reported on  06/28/2019 02/15/15   Geradine Girt, DO  alum & mag hydroxide-simeth (MAALOX/MYLANTA) 200-200-20 MG/5ML suspension Take 30 mLs by mouth every 4 (four) hours as needed for indigestion. Patient not taking: Reported on 06/28/2019 02/15/15   Geradine Girt, DO  baclofen (LIORESAL) 10 MG tablet Take 1 tablet (10 mg total) by mouth 3 (three) times daily. As needed for muscle spasm Patient not taking: Reported on 06/28/2019 02/12/15   Marchia Bond, MD  bisacodyl (DULCOLAX) 10 MG suppository Place 1 suppository (10 mg total) rectally daily as needed for moderate constipation. Patient not taking: Reported on 06/28/2019 02/15/15   Geradine Girt, DO  enoxaparin (LOVENOX) 40 MG/0.4ML injection Inject 0.4 mLs (40 mg total) into the skin daily. Patient not taking: Reported on 06/28/2019 02/12/15   Marchia Bond, MD  ferrous sulfate 325 (65 FE) MG tablet Take 1 tablet (325 mg total) by mouth 3 (three) times daily after meals. Patient not taking: Reported on 06/28/2019 02/15/15   Geradine Girt, DO  polyethylene glycol (MIRALAX / GLYCOLAX) packet Take 17 g by mouth daily. Patient not taking: Reported on 06/28/2019 02/15/15   Geradine Girt, DO  sennosides-docusate sodium (SENOKOT-S) 8.6-50 MG tablet Take 2 tablets by mouth daily. Patient not taking: Reported on 07/04/2019 02/12/15   Marchia Bond, MD     Family History  Problem Relation Age of Onset  . Alzheimer's disease Mother   . Prostate cancer Father     Social History   Socioeconomic History  . Marital status: Widowed    Spouse name: Not on file  . Number of children: Not on file  . Years of education: Not on file  . Highest education level: Not on file  Occupational History  . Not on file  Tobacco Use  . Smoking status: Never Smoker  . Smokeless tobacco: Never Used  Substance and Sexual Activity  . Alcohol use: No  . Drug use: Not on file  . Sexual activity: Not on file  Other Topics Concern  . Not on file  Social History Narrative  .  Not on file   Social Determinants of Health   Financial Resource Strain:   . Difficulty of Paying Living Expenses: Not on file  Food Insecurity:   . Worried About Charity fundraiser in the Last Year: Not on file  . Ran Out of Food in the Last Year: Not on file  Transportation Needs:   . Lack of Transportation (Medical): Not on file  . Lack of Transportation (Non-Medical): Not on file  Physical Activity:   . Days of Exercise per Week: Not on file  . Minutes of Exercise per Session: Not on file  Stress:   . Feeling of Stress : Not on file  Social Connections:   . Frequency of Communication with Friends and Family: Not on file  . Frequency of Social Gatherings with Friends and Family: Not on file  . Attends Religious Services: Not on file  . Active Member of Clubs or Organizations: Not on file  . Attends Archivist Meetings: Not on file  . Marital Status: Not on file     Review of Systems: A 12 point ROS discussed and pertinent positives are indicated in the HPI above.  All other  systems are negative.  Review of Systems  Unable to perform ROS: Other (HOH)    Vital Signs: BP (!) 153/67 (BP Location: Left Arm)   Pulse 69   Temp 98.8 F (37.1 C)   Resp 19   SpO2 99%   Physical Exam Vitals and nursing note reviewed.  Constitutional:      General: She is not in acute distress.    Appearance: Normal appearance.  Cardiovascular:     Rate and Rhythm: Normal rate and regular rhythm.     Heart sounds: Normal heart sounds. No murmur.  Pulmonary:     Effort: Pulmonary effort is normal. No respiratory distress.     Breath sounds: Normal breath sounds. No wheezing.  Musculoskeletal:     Comments: Moderate midline low back tenderness.  Skin:    General: Skin is warm and dry.  Neurological:     Mental Status: She is alert and oriented to person, place, and time.  Psychiatric:        Mood and Affect: Mood normal.        Behavior: Behavior normal.      MD  Evaluation Airway: WNL Heart: WNL Abdomen: WNL Chest/ Lungs: WNL ASA  Classification: 2 Mallampati/Airway Score: Two   Imaging: CT Lumbar Spine Wo Contrast  Result Date: 06/28/2019 CLINICAL DATA:  Back pain. Lifting injury. Patient felt sudden onset of pain. EXAM: CT LUMBAR SPINE WITHOUT CONTRAST TECHNIQUE: Multidetector CT imaging of the lumbar spine was performed without intravenous contrast administration. Multiplanar CT image reconstructions were also generated. COMPARISON:  None. FINDINGS: Segmentation: 5 lumbar type vertebral bodies assumed. Alignment: Thoracolumbar kyphosis. Increased lumbar lordosis. 3 mm degenerative anterolisthesis L4-5. Vertebrae: Acute appearing superior endplate fracture at T12 with loss of height of 1/3. No retropulsed bone. Old appearing superior endplate fracture of L1, inferior endplate fracture of L2 and generalized vertebral body fracture at L5. Those all look old and healed. Paraspinal and other soft tissues: Negative other than aortic atherosclerosis. Disc levels: No significant disc space pathology at L3-4 or above. L4-5: Facet arthropathy with 3 mm of anterolisthesis. Endplate osteophytes and bulging of the disc. Moderate multifactorial stenosis. L5-S1: Disc bulge more prominent towards the left. Bilateral facet degeneration. Lateral recess and foraminal encroachment. IMPRESSION: Probable superior endplate fracture at T12 with loss of height of 1/3. No retropulsion. Minimal posterior bowing of the posterosuperior margin. Old healed fractures at L1, L2 and L5. Degenerative disease with moderate multifactorial stenosis at L4-5. Electronically Signed   By: Paulina Fusi M.D.   On: 06/28/2019 10:43   MR LUMBAR SPINE WO CONTRAST  Result Date: 07/06/2019 CLINICAL DATA:  Lumbar region back pain EXAM: MRI LUMBAR SPINE WITHOUT CONTRAST TECHNIQUE: Multiplanar, multisequence MR imaging of the lumbar spine was performed. No intravenous contrast was administered.  COMPARISON:  CT 06/28/2019 FINDINGS: Segmentation: 5 lumbar type vertebral bodies as numbered previously. Alignment: Increased kyphotic curvature at the thoracolumbar junction region due to fractures. 3 mm degenerative anterolisthesis L4-5. 1 mm degenerative anterolisthesis L5-S1. Vertebrae: Progression of the acute fracture at T12 compared to the prior CT. Loss of height now of 60%. Posterior bowing of the posterior margin of the vertebral body by 4 mm. Persistent marrow space edema. Old healed superior endplate fracture at L1, inferior endplate fracture at L2 and generalized vertebral body loss of height at L5 appear unchanged, without any edema. No evidence of sacral fracture. Conus medullaris and cauda equina: Conus extends to the L1-2 level. Conus and cauda equina appear normal. Paraspinal and other  soft tissues: Chololithiasis. Disc levels: T11-12: No traumatic disc herniation. At T12, there is canal stenosis because of 4 mm posterior bowing of the superior endplate of T12. This effaces the subarachnoid space and indents the cord slightly. The T12-L1 disc does not show bulge or herniation. L1-2: Disc degeneration with shallow protrusion and slight caudal migration in the midline. Facet and ligamentous prominence. Spinal stenosis at this level with effacement of the subarachnoid space and crowding of the nerve roots of the cauda equina. L2-3: Bulging of the disc. Mild stenosis of both lateral recesses but no definite neural compression. L3-4: Interspace. L4-5: Chronic facet arthropathy with 3 mm of anterolisthesis. Bulging of the disc. Stenosis of both lateral recesses and proximal foramina. Some potential for neural compression at this level. L5-S1: Facet osteoarthritis with 1 mm of anterolisthesis. No disc herniation. No compressive stenosis. Edema between the spinous processes of L2-3 and L3-4 indicate abutment and could contribute to low back pain. IMPRESSION: Progression of the fracture at T12 since the CT  study of 8 days ago. Loss of height has worsened from 30% to about 60%. Edema associated with the recent/active nature of the process. This looks like a benign fracture. Posterior bowing of the posterosuperior margin of the vertebral body with narrowing of the canal. Slight indentation of the cord without frank cord compression. Old healed superior endplate fracture at L1, inferior endplate fracture at L2 and generalized vertebral body height loss at L5. No residual edema at those levels. Multifactorial stenosis at L1-2 with effacement of the subarachnoid space and crowding of the nerve roots of the cauda equina. Bilateral lateral recess stenosis at L4-5 that could be symptomatic. Facet osteoarthritis at L4-5 and L5-S1 could also contribute to low back pain. Chololithiasis. Abutment changes between the spinous processes at L2-3 and L3-4 could contribute to back pain. Electronically Signed   By: Paulina Fusi M.D.   On: 07/06/2019 13:23    Labs:  CBC: Recent Labs    07/03/19 2301  WBC 8.9  HGB 10.7*  HCT 30.7*  PLT 237    COAGS: No results for input(s): INR, APTT in the last 8760 hours.  BMP: Recent Labs    07/03/19 2301  NA 130*  K 4.6  CL 99  CO2 22  GLUCOSE 110*  BUN 12  CALCIUM 8.9  CREATININE 1.06*  GFRNONAA 48*  GFRAA 55*     Assessment and Plan:  T12 compression fracture. Plan for image-guided T12 kyphoplasty/vertebroplasty tentatively for 07/08/2019 in IR with Dr. Corliss Skains. Patient will be NPO at midnight. Afebrile and WBCs WNL. Lovenox held per IR protocol. INR pending for 0500 tomorrow. UA 07/04/2019 indicative of UTI, patient currently on day 3 of antibiotics. Per Dr. Corliss Skains, repeat UA today and ok to proceed with procedure if UTI has improved.  Risks and benefits of T12 kyphoplasty/vertebroplasty were discussed with the patient including, but not limited to education regarding the natural healing process of compression fractures without intervention,  bleeding, infection, cement migration which may cause spinal cord damage, paralysis, pulmonary embolism or even death. This interventional procedure involves the use of X-rays and because of the nature of the planned procedure, it is possible that we will have prolonged use of X-ray fluoroscopy. Potential radiation risks to you include (but are not limited to) the following: - A slightly elevated risk for cancer  several years later in life. This risk is typically less than 0.5% percent. This risk is low in comparison to the normal incidence of human cancer,  which is 33% for women and 50% for men according to the [employer] Cancer Society. - Radiation induced injury can include skin redness, resembling a rash, tissue breakdown / ulcers and hair loss (which can be temporary or permanent).  The likelihood of either of these occurring depends on the difficulty of the procedure and whether you are sensitive to radiation due to previous procedures, disease, or genetic conditions.  IF your procedure requires a prolonged use of radiation, you will be notified and given written instructions for further action.  It is your responsibility to monitor the irradiated area for the 2 weeks following the procedure and to notify your physician if you are concerned that you have suffered a radiation induced injury. Patient is alert and oriented x3, however is extremely hard of hearing. She states that she "wants whatever done that will help me", but asks that I call her niece, Emeline Darlingeresa Ratcliff, for consent. Spoke with Emeline Darlingeresa Ratcliff via telephone who agrees for patient to undergo procedure- consent signed and in IR control room.   Thank you for this interesting consult.  I greatly enjoyed meeting Orpah Greekellie Lea Mucci and look forward to participating in their care.  A copy of this report was sent to the requesting provider on this date.  Electronically Signed: Elwin MochaAlexandra Eh Sauseda, PA-C 07/07/2019, 4:38 PM   I spent a total  of 40 Minutes in face to face in clinical consultation, greater than 50% of which was counseling/coordinating care for T12 compression fracture/vertebroplasty.

## 2019-07-07 NOTE — Progress Notes (Signed)
Physical Therapy Treatment Patient Details Name: Modestine Scherzinger MRN: [id number] DOB: Jul 14, 1931 Today's Date: 07/07/2019    History of Present Illness Pt is an 85 y/o female admitted secondary to worsening back pain. Pt presented to ER ~1 week ago with back pain from lifting/moving a table and then hearing a pop. At that time, pt found to have a T12 endplate fx. No surgery performed. 12/15 awaiting IR consult for possible kyphoplasty  PMH including but not limited to HTN and CKD.    PT Comments    Patient very anxious (about pain, not having a bath, not having been OOB earlier today). Niece present throughout session and tries to reassure pt. Patient able to transfer 3x during session and walked 5 ft from [facility] to recliner. Patient requires incr time due to highly distractable, required use of BSC, and multiple lines/tubes. Per RN, pt is ?going to have a kyphoplasty in next 1-2 days.     Follow Up Recommendations  SNF     Equipment Recommendations  None recommended by PT    Recommendations for Other Services       Precautions / Restrictions Precautions Precautions: Back;Fall Required Braces or Orthoses: (niece took brace home) Spinal Brace: Thoracolumbosacral orthotic;Other (comment) Spinal Brace Comments: TLSO not present during evaluation Restrictions Weight Bearing Restrictions: No    Mobility  Bed Mobility Overal bed mobility: Needs Assistance Bed Mobility: Rolling;Sidelying to Sit Rolling: Min assist Sidelying to sit: Mod assist       General bed mobility comments: increased time and effort, cueing for log roll technique, assistance needed for LE management and trunk elevation to achieve upright sitting at EOB  Transfers Overall transfer level: Needs assistance Equipment used: Rolling walker (2 wheeled) Transfers: Sit to/from Stand Sit to Stand: Min assist         General transfer comment: from bed and BSC  Ambulation/Gait Ambulation/Gait assistance: Min  assist Gait Distance (Feet): 5 Feet(x 2) Assistive device: Rolling walker (2 wheeled) Gait Pattern/deviations: Step-to pattern;Decreased stride length;Trunk flexed     General Gait Details: pt has to push RW too far ahead as it is too tall for her; she was able to maneuver it bed to [facility] with min assist to get untangled from leg of BSC.    Stairs             Wheelchair Mobility    Modified Rankin (Stroke Patients Only)       Balance Overall balance assessment: Needs assistance Sitting-balance support: No upper extremity supported;Feet supported Sitting balance-Leahy Scale: Fair     Standing balance support: Bilateral upper extremity supported Standing balance-Leahy Scale: Poor                              Cognition Arousal/Alertness: Awake/alert Behavior During Therapy: Anxious;Restless Overall Cognitive Status: Impaired/Different from baseline Area of Impairment: Attention;Memory;Following commands;Safety/judgement;Awareness;Problem solving                   Current Attention Level: Selective Memory: Decreased recall of precautions;Decreased short-term memory Following Commands: Follows one step commands with increased time;Follows one step commands inconsistently Safety/Judgement: Decreased awareness of deficits;Decreased awareness of safety Awareness: Intellectual Problem Solving: Difficulty sequencing;Requires verbal cues        Exercises      General Comments General comments (skin integrity, edema, etc.): Niece present during session and becomes upset with PT mentioning hopeful plan to have kyphoplasty. She states no one has talked with  her; RN notified      Pertinent Vitals/Pain Pain Assessment: Faces Faces Pain Scale: Hurts worst Pain Location: back Pain Descriptors / Indicators: Grimacing;Guarding;Moaning Pain Intervention(s): Limited activity within patient's tolerance;Monitored during session;Premedicated before  session;Repositioned    Home Living                      Prior Function            PT Goals (current goals can now be found in the care plan section) Acute Rehab PT Goals Patient Stated Goal: decrease pain Time For Goal Achievement: 07/18/19 Potential to Achieve Goals: Fair Progress towards PT goals: Progressing toward goals    Frequency    Min 2X/week      PT Plan      Co-evaluation              AM-PAC PT "6 Clicks" Mobility   Outcome Measure  Help needed turning from your back to your side while in a flat bed without using bedrails?: A Lot Help needed moving from lying on your back to sitting on the side of a flat bed without using bedrails?: A Lot Help needed moving to and from a bed to a chair (including a wheelchair)?: A Little Help needed standing up from a chair using your arms (e.g., wheelchair or bedside chair)?: A Little Help needed to walk in hospital room?: A Little Help needed climbing 3-5 steps with a railing? : Total 6 Click Score: 14    End of Session   Activity Tolerance: Patient limited by pain;Patient limited by fatigue Patient left: in bed;with call bell/phone within reach;with bed alarm set;Other (comment)(sitting EOB to eat breakfast) Nurse Communication: Mobility status PT Visit Diagnosis: Other abnormalities of gait and mobility (R26.89);Pain Pain - part of body: (back)     Time: 4665-9935 PT Time Calculation (min) (ACUTE ONLY): 49 min  Charges:  $Gait Training: 38-52 mins                      Jerolyn Center, PT Pager [phone]    Zena Amos 07/07/2019, 5:45 PM

## 2019-07-08 ENCOUNTER — Inpatient Hospital Stay (HOSPITAL_COMMUNITY): Payer: Medicare Other

## 2019-07-08 HISTORY — PX: IR VERTEBROPLASTY CERV/THOR BX INC UNI/BIL INC/INJECT/IMAGING: IMG5515

## 2019-07-08 LAB — URINALYSIS, COMPLETE (UACMP) WITH MICROSCOPIC
Bacteria, UA: NONE SEEN
Bilirubin Urine: NEGATIVE
Glucose, UA: NEGATIVE mg/dL
Hgb urine dipstick: NEGATIVE
Ketones, ur: NEGATIVE mg/dL
Leukocytes,Ua: NEGATIVE
Nitrite: NEGATIVE
Protein, ur: NEGATIVE mg/dL
Specific Gravity, Urine: 1.009 (ref 1.005–1.030)
pH: 7 (ref 5.0–8.0)

## 2019-07-08 LAB — BASIC METABOLIC PANEL
Anion gap: 8 (ref 5–15)
BUN: 18 mg/dL (ref 8–23)
CO2: 23 mmol/L (ref 22–32)
Calcium: 8.9 mg/dL (ref 8.9–10.3)
Chloride: 99 mmol/L (ref 98–111)
Creatinine, Ser: 0.99 mg/dL (ref 0.44–1.00)
GFR calc Af Amer: 60 mL/min — ABNORMAL LOW (ref >60)
GFR calc non Af Amer: 52 mL/min — ABNORMAL LOW (ref >60)
Glucose, Bld: 109 mg/dL — ABNORMAL HIGH (ref 70–99)
Potassium: 4.9 mmol/L (ref 3.5–5.1)
Sodium: 130 mmol/L — ABNORMAL LOW (ref 135–145)

## 2019-07-08 LAB — CBC
HCT: 32.2 % — ABNORMAL LOW (ref 36.0–46.0)
Hemoglobin: 11 g/dL — ABNORMAL LOW (ref 12.0–15.0)
MCH: 34.9 pg — ABNORMAL HIGH (ref 26.0–34.0)
MCHC: 34.2 g/dL (ref 30.0–36.0)
MCV: 102.2 fL — ABNORMAL HIGH (ref 80.0–100.0)
Platelets: 364 10*3/uL (ref 150–400)
RBC: 3.15 MIL/uL — ABNORMAL LOW (ref 3.87–5.11)
RDW: 11.9 % (ref 11.5–15.5)
WBC: 7.5 10*3/uL (ref 4.0–10.5)
nRBC: 0 % (ref 0.0–0.2)

## 2019-07-08 LAB — PROTIME-INR
INR: 1 (ref 0.8–1.2)
Prothrombin Time: 13 seconds (ref 11.4–15.2)

## 2019-07-08 LAB — MAGNESIUM: Magnesium: 2.1 mg/dL (ref 1.7–2.4)

## 2019-07-08 MED ORDER — TOBRAMYCIN SULFATE 1.2 G IJ SOLR
INTRAMUSCULAR | Status: AC
  Filled 2019-07-08: qty 1.2

## 2019-07-08 MED ORDER — FENTANYL CITRATE (PF) 100 MCG/2ML IJ SOLN
INTRAMUSCULAR | Status: AC | PRN
  Administered 2019-07-08: 25 ug via INTRAVENOUS

## 2019-07-08 MED ORDER — MIDAZOLAM HCL 2 MG/2ML IJ SOLN
INTRAMUSCULAR | Status: AC
  Filled 2019-07-08: qty 2

## 2019-07-08 MED ORDER — SODIUM CHLORIDE 0.9 % IV SOLN: INTRAVENOUS | Status: AC

## 2019-07-08 MED ORDER — BUPIVACAINE HCL (PF) 0.5 % IJ SOLN
INTRAMUSCULAR | Status: AC | PRN
  Administered 2019-07-08: 20 mL

## 2019-07-08 MED ORDER — CEFAZOLIN SODIUM-DEXTROSE 1-4 GM/50ML-% IV SOLN
INTRAVENOUS | Status: AC | PRN
  Administered 2019-07-08: 1 g via INTRAVENOUS

## 2019-07-08 MED ORDER — MIDAZOLAM HCL 2 MG/2ML IJ SOLN
INTRAMUSCULAR | Status: AC | PRN
  Administered 2019-07-08: 1 mg via INTRAVENOUS

## 2019-07-08 MED ORDER — TOBRAMYCIN SULFATE 1.2 G IJ SOLR
INTRAMUSCULAR | Status: AC | PRN
  Administered 2019-07-08: .01 g via TOPICAL

## 2019-07-08 MED ORDER — FENTANYL CITRATE (PF) 100 MCG/2ML IJ SOLN
INTRAMUSCULAR | Status: AC
  Filled 2019-07-08: qty 2

## 2019-07-08 MED ORDER — CEFAZOLIN SODIUM-DEXTROSE 2-4 GM/100ML-% IV SOLN
INTRAVENOUS | Status: AC
  Filled 2019-07-08: qty 100

## 2019-07-08 MED ORDER — BUPIVACAINE HCL (PF) 0.5 % IJ SOLN
INTRAMUSCULAR | Status: AC
  Filled 2019-07-08: qty 30

## 2019-07-08 MED ORDER — IOHEXOL 300 MG/ML  SOLN
50.0000 mL | Freq: Once | INTRAMUSCULAR | Status: AC | PRN
  Administered 2019-07-08: 3 mL

## 2019-07-08 MED ORDER — GELATIN ABSORBABLE 12-7 MM EX MISC
CUTANEOUS | Status: AC
  Filled 2019-07-08: qty 1

## 2019-07-08 NOTE — Procedures (Signed)
S/P T 12 VP with biopsy. S.Alicia Brien MD

## 2019-07-08 NOTE — Progress Notes (Signed)
PROGRESS NOTE  Alicia Schwartz IBB:[id number] DOB: 1931-01-28 DOA: 07/03/2019 PCP: Aida Puffer, MD  HPI/Recap of past 24 hours: This is an 85 year old female with history of hypertension, paroxysmal tachycardia, chronic kidney disease stage III who was brought to the emergency department for the second time in 1 week because of worsening back pain.  Apparently a week ago patient was bending to do something and then she felt a pop in her mid back and started having severe pain she was seen at the emergency department a CT scan was done which showed T12 endplate fracture and advised to stop brace and follow-up with neurosurgery on June 29, 2019.  She returns to the emergency department because of pain has not recently worsened, had Lumbar MRI 12/14 that confirms acute compression fracture T12 with further loss of height.  07/08/19: Seen and examined.  Difficult to obtain review of systems due to very hard of hearing.  Vertebroplasty completed with biopsy by interventional radiology on 07/08/2019.  Highly appreciate expertise and assistance.  Assessment/Plan: Principal Problem:   Mid back pain Active Problems:   Paroxysmal supraventricular tachycardia (HCC)   Closed T12 fracture (HCC)   Hypertensive urgency   Back pain   Pressure injury of skin   Compression fracture of T12 post vertebroplasty and biopsy on 07/08/2019 by IR. Presented with severe mid back pain, could not wear her back brace due to severe pain. T12 vertebroplasty and biopsy done on 07/08/19 by IR.  Follow-up biopsy. Pain management and bowel regimen in place, continue  E. coli UTI Urine culture taken on 07/04/2019 showed greater than 100,000 colonies of E. coli pansensitive Continue Ancef 3 times daily, started on 07/06/2019.  Hypovolemic hyponatremia Sodium 130 and hypovolemic on exam Start gentle IV fluid hydration normal saline at 50 cc/h x 1 day. Repeat BMP in the morning  Uncontrolled hypertension,  possibly contributed by pain Control her pain with pain management Continue Norvasc 5 mg, lisinopril 20 mg daily, Toprol-XL 50 mg nightly, spironolactone 25 mg daily IV labetalol as needed with parameters Continue to monitor vital signs  Paroxysmal supraventricular tachycardia Rate controlled on metoprolol  CKD 3 She appears to be at her baseline creatinine 0.9 with GFR 52 Continue to avoid nephrotoxins Start IV fluid hydration normal saline at 50 cc/h Monitor urine output.  Macrocytic anemia Hg stable Continue to monitor  Code Status: DNR  Severity of Illness: The appropriate patient status for this patient is INPATIENT. Inpatient status is judged to be reasonable and necessary in order to provide the required intensity of service to ensure the patient's safety. The patient's presenting symptoms, physical exam findings, and initial radiographic and laboratory data in the context of their chronic comorbidities is felt to place them at high risk for further clinical deterioration. Furthermore, it is not anticipated that the patient will be medically stable for discharge from the hospital within 2 midnights of admission. The following factors support the patient status of inpatient.   "           The patient's presenting symptoms include severe low back pain Patient has fracture of the lumbar spine T12  * I certify that at the point of admission it is my clinical judgment that the patient will require inpatient hospital care spanning beyond 2 midnights from the point of admission due to high intensity of service, high risk for further deterioration and high frequency of surveillance required.*   Family Communication RN has spoken with family this AM and notified about  possibility of kyphoplasty after MRI.  Disposition Plan: To be determined after kyphoplasty, possibly SNF.  Consultants:  IR  Procedures:  None  Antimicrobials:  None  DVT prophylaxis: SCDs defer  restarting chemical DVT PPX to IR   Objective: Vitals:   07/08/19 0945 07/08/19 0950 07/08/19 1020 07/08/19 1029  BP: (!) 195/85  (!) 195/74 (!) 151/76  Pulse: 72 70 62 74  Resp: 14 (!) 21 18   Temp:   (!) 97.5 F (36.4 C)   TempSrc:   Oral   SpO2: 100% 100% 100%     Intake/Output Summary (Last 24 hours) at 07/08/2019 1059 Last data filed at 07/08/2019 0500 Gross per 24 hour  Intake --  Output 900 ml  Net -900 ml   There were no vitals filed for this visit.  Exam:  . General: 85 y.o. year-old female well developed well nourished in no acute distress.  Alert and very hard of hearing. . Cardiovascular: Regular rate and rhythm with no rubs or gallops.  No thyromegaly or JVD noted.   Marland Kitchen. Respiratory: Clear to auscultation with no wheezes or rales. Good inspiratory effort. . Abdomen: Soft nontender nondistended with normal bowel sounds x4 quadrants. . Musculoskeletal: No lower extremity edema. 2/4 pulses in all 4 extremities. Marland Kitchen. Psychiatry: Mood is appropriate for condition and setting   Data Reviewed: CBC: Recent Labs  Lab 07/03/19 2301 07/08/19 0618  WBC 8.9 7.5  NEUTROABS 6.5  --   HGB 10.7* 11.0*  HCT 30.7* 32.2*  MCV 100.7* 102.2*  PLT 237 364   Basic Metabolic Panel: Recent Labs  Lab 07/03/19 2301 07/08/19 0618  NA 130* 130*  K 4.6 4.9  CL 99 99  CO2 22 23  GLUCOSE 110* 109*  BUN 12 18  CREATININE 1.06* 0.99  CALCIUM 8.9 8.9  MG  --  2.1   GFR: CrCl cannot be calculated (Unknown ideal weight.). Liver Function Tests: No results for input(s): AST, ALT, ALKPHOS, BILITOT, PROT, ALBUMIN in the last 168 hours. No results for input(s): LIPASE, AMYLASE in the last 168 hours. No results for input(s): AMMONIA in the last 168 hours. Coagulation Profile: Recent Labs  Lab 07/08/19 0319  INR 1.0   Cardiac Enzymes: No results for input(s): CKTOTAL, CKMB, CKMBINDEX, TROPONINI in the last 168 hours. BNP (last 3 results) No results for input(s): PROBNP in the  last 8760 hours. HbA1C: No results for input(s): HGBA1C in the last 72 hours. CBG: No results for input(s): GLUCAP in the last 168 hours. Lipid Profile: No results for input(s): CHOL, HDL, LDLCALC, TRIG, CHOLHDL, LDLDIRECT in the last 72 hours. Thyroid Function Tests: No results for input(s): TSH, T4TOTAL, FREET4, T3FREE, THYROIDAB in the last 72 hours. Anemia Panel: No results for input(s): VITAMINB12, FOLATE, FERRITIN, TIBC, IRON, RETICCTPCT in the last 72 hours. Urine analysis:    Component Value Date/Time   COLORURINE STRAW (A) 07/08/2019 0350   APPEARANCEUR CLEAR 07/08/2019 0350   LABSPEC 1.009 07/08/2019 0350   PHURINE 7.0 07/08/2019 0350   GLUCOSEU NEGATIVE 07/08/2019 0350   HGBUR NEGATIVE 07/08/2019 0350   BILIRUBINUR NEGATIVE 07/08/2019 0350   KETONESUR NEGATIVE 07/08/2019 0350   PROTEINUR NEGATIVE 07/08/2019 0350   NITRITE NEGATIVE 07/08/2019 0350   LEUKOCYTESUR NEGATIVE 07/08/2019 0350   Sepsis Labs: @LABRCNTIP (procalcitonin:4,lacticidven:4)  ) Recent Results (from the past 240 hour(s))  SARS CORONAVIRUS 2 (TAT 6-24 HRS) Nasopharyngeal Nasopharyngeal Swab     Status: None   Collection Time: 07/03/19 11:01 PM   Specimen: Nasopharyngeal Swab  Result Value Ref Range Status   SARS Coronavirus 2 NEGATIVE NEGATIVE Final    Comment: (NOTE) SARS-CoV-2 target nucleic acids are NOT DETECTED. The SARS-CoV-2 RNA is generally detectable in upper and lower respiratory specimens during the acute phase of infection. Negative results do not preclude SARS-CoV-2 infection, do not rule out co-infections with other pathogens, and should not be used as the sole basis for treatment or other patient management decisions. Negative results must be combined with clinical observations, patient history, and epidemiological information. The expected result is Negative. Fact Sheet for Patients: SugarRoll.be Fact Sheet for Healthcare  Providers: https://www.woods-mathews.com/ This test is not yet approved or cleared by the [place] FDA and  has been authorized for detection and/or diagnosis of SARS-CoV-2 by FDA under an Emergency Use Authorization (EUA). This EUA will remain  in effect (meaning this test can be used) for the duration of the COVID-19 declaration under Section 56 4(b)(1) of the Act, 21 U.S.C. section 360bbb-3(b)(1), unless the authorization is terminated or revoked sooner. Performed at [facility], [place] [address] [place], [place] [address]   Urine culture     Status: Abnormal   Collection Time: 07/04/19  3:50 AM   Specimen: Urine, Random  Result Value Ref Range Status   Specimen Description URINE, RANDOM  Final   Special Requests   Final    NONE Performed at [facility], [place] [address] [place], [place] [address]    Culture >=100,000 COLONIES/mL ESCHERICHIA COLI (A)  Final   Report Status 07/06/2019 FINAL  Final   Organism ID, Bacteria ESCHERICHIA COLI (A)  Final      Susceptibility   Escherichia coli - MIC*    AMPICILLIN <=2 SENSITIVE Sensitive     CEFAZOLIN <=4 SENSITIVE Sensitive     CEFTRIAXONE <=1 SENSITIVE Sensitive     CIPROFLOXACIN <=0.25 SENSITIVE Sensitive     GENTAMICIN <=1 SENSITIVE Sensitive     IMIPENEM <=0.25 SENSITIVE Sensitive     NITROFURANTOIN <=16 SENSITIVE Sensitive     TRIMETH/SULFA <=20 SENSITIVE Sensitive     AMPICILLIN/SULBACTAM <=2 SENSITIVE Sensitive     PIP/TAZO <=4 SENSITIVE Sensitive     * >=100,000 COLONIES/mL ESCHERICHIA COLI      Studies: No results found.  Scheduled Meds: . amLODipine  5 mg Oral Daily  . bupivacaine      . docusate sodium  100 mg Oral Daily  . fentaNYL      . folic acid  1 mg Oral Daily  . lisinopril  20 mg Oral Daily  . metoprolol succinate  50 mg Oral QPM  . midazolam      . polyethylene glycol  17 g Oral Daily  . spironolactone  25 mg Oral Daily  . tobramycin        Continuous Infusions: .  ceFAZolin    .  ceFAZolin (ANCEF) IV 1 g (07/08/19 0606)     LOS: 4 days     Kayleen Memos, MD [facility] Pager [phone]  If 7PM-7AM, please contact night-coverage [email or web address] Password [email or web address] 07/08/2019, 10:59 AM

## 2019-07-08 NOTE — Plan of Care (Signed)
  Problem: Education: Goal: Knowledge of General Education information will improve Description: Including pain rating scale, medication(s)/side effects and non-pharmacologic comfort measures Outcome: Progressing   Problem: Health Behavior/Discharge Planning: Goal: Ability to manage health-related needs will improve Outcome: Progressing   Problem: Clinical Measurements: Goal: Ability to maintain clinical measurements within normal limits will improve Outcome: Progressing Goal: Will remain free from infection Outcome: Progressing Goal: Diagnostic test results will improve Outcome: Progressing Goal: Respiratory complications will improve Outcome: Progressing   Problem: Activity: Goal: Risk for activity intolerance will decrease Outcome: Progressing   Problem: Nutrition: Goal: Adequate nutrition will be maintained Outcome: Progressing   Problem: Coping: Goal: Level of anxiety will decrease Outcome: Progressing   Problem: Elimination: Goal: Will not experience complications related to bowel motility Outcome: Progressing   Problem: Pain Managment: Goal: General experience of comfort will improve Outcome: Progressing   Problem: Safety: Goal: Ability to remain free from injury will improve Outcome: Progressing   Problem: Skin Integrity: Goal: Risk for impaired skin integrity will decrease Outcome: Progressing   

## 2019-07-08 NOTE — Discharge Instructions (Signed)
1. No stooping,bending or lifting more than 10 lbs for 2 weeks. °2. Use walker to ambulate for  2 weeks. °3.RTC PRN 2 weeks  °

## 2019-07-09 LAB — CBC
HCT: 30.8 % — ABNORMAL LOW (ref 36.0–46.0)
Hemoglobin: 10.7 g/dL — ABNORMAL LOW (ref 12.0–15.0)
MCH: 35.3 pg — ABNORMAL HIGH (ref 26.0–34.0)
MCHC: 34.7 g/dL (ref 30.0–36.0)
MCV: 101.7 fL — ABNORMAL HIGH (ref 80.0–100.0)
Platelets: 366 10*3/uL (ref 150–400)
RBC: 3.03 MIL/uL — ABNORMAL LOW (ref 3.87–5.11)
RDW: 11.8 % (ref 11.5–15.5)
WBC: 9.5 10*3/uL (ref 4.0–10.5)
nRBC: 0 % (ref 0.0–0.2)

## 2019-07-09 LAB — BASIC METABOLIC PANEL
Anion gap: 10 (ref 5–15)
BUN: 22 mg/dL (ref 8–23)
CO2: 21 mmol/L — ABNORMAL LOW (ref 22–32)
Calcium: 8.7 mg/dL — ABNORMAL LOW (ref 8.9–10.3)
Chloride: 100 mmol/L (ref 98–111)
Creatinine, Ser: 1.05 mg/dL — ABNORMAL HIGH (ref 0.44–1.00)
GFR calc Af Amer: 56 mL/min — ABNORMAL LOW (ref >60)
GFR calc non Af Amer: 48 mL/min — ABNORMAL LOW (ref >60)
Glucose, Bld: 101 mg/dL — ABNORMAL HIGH (ref 70–99)
Potassium: 4.8 mmol/L (ref 3.5–5.1)
Sodium: 131 mmol/L — ABNORMAL LOW (ref 135–145)

## 2019-07-09 LAB — SURGICAL PATHOLOGY

## 2019-07-09 MED ORDER — POLYETHYLENE GLYCOL 3350 17 G PO PACK: 17.0000 g | PACK | Freq: Every day | ORAL | Status: DC | PRN

## 2019-07-09 MED ORDER — CEPHALEXIN 500 MG PO CAPS
500.0000 mg | ORAL_CAPSULE | Freq: Three times a day (TID) | ORAL | Status: DC
  Administered 2019-07-09 – 2019-07-12 (×10): 500 mg via ORAL
  Filled 2019-07-09 (×10): qty 1

## 2019-07-09 MED ORDER — CEPHALEXIN 500 MG PO CAPS: 500.0000 mg | ORAL_CAPSULE | Freq: Three times a day (TID) | ORAL | Status: DC

## 2019-07-09 NOTE — Progress Notes (Signed)
PROGRESS NOTE  Breyana Follansbee ZOX:[id number] DOB: 1931-06-24 DOA: 07/03/2019 PCP: Aida Puffer, MD  HPI/Recap of past 24 hours: This is an 85 year old female with history of hypertension, paroxysmal tachycardia, chronic kidney disease stage III who was brought to the emergency department for the second time in 1 week because of worsening back pain.  Apparently a week ago patient was bending to do something and then she felt a pop in her mid back and started having severe pain she was seen at the emergency department a CT scan was done which showed T12 endplate fracture and advised to stop brace and follow-up with neurosurgery on June 29, 2019.  She returns to the emergency department because of pain has not recently worsened, had Lumbar MRI 12/14 that confirms acute compression fracture T12 with further loss of height.  07/09/19: Doing well. Still having some pain. Mildly constipated.   Assessment/Plan: Principal Problem:   Mid back pain Active Problems:   Paroxysmal supraventricular tachycardia (HCC)   Closed T12 fracture (HCC)   Hypertensive urgency   Back pain   Pressure injury of skin   Compression fracture of T12 post vertebroplasty and biopsy on 07/08/2019 by IR.  Presented with severe mid back pain, could not wear her back brace due to severe pain.  T12 vertebroplasty and biopsy done on 07/08/19 by IR.  Follow-up biopsy.  Pain management and bowel regimen in place, continue  E. coli UTI  Urine culture taken on 07/04/2019 showed greater than 100,000 colonies of E. coli pansensitive  Continue Ancef 3 times daily, started on 07/06/2019.  Continue for 7 days total - change to keflex  TID.  Hypovolemic hyponatremia  Sodium 130 and hypovolemic on exam  IVF: gentle IV fluid hydration normal saline at 50 cc/h x 1 day.  Slightly improved. Continue with gentle hydration.  Recheck BMP in AM.  Uncontrolled hypertension, possibly contributed by pain  Control  her pain with pain management  Continue Norvasc 5 mg, lisinopril 20 mg daily, Toprol-XL 50 mg nightly, spironolactone 25 mg daily  IV labetalol as needed with parameters  Continue to monitor vital signs  Paroxysmal supraventricular tachycardia  Rate controlled on metoprolol  CKD 3  She appears to be at her baseline creatinine 0.9 with GFR 52  Continue to avoid nephrotoxins  Start IV fluid hydration normal saline at 50 cc/h  Monitor urine output.  Macrocytic anemia  Hg stable  Continue to monitor  Code Status: DNR  Severity of Illness: The appropriate patient status for this patient is INPATIENT. Inpatient status is judged to be reasonable and necessary in order to provide the required intensity of service to ensure the patient's safety. The patient's presenting symptoms, physical exam findings, and initial radiographic and laboratory data in the context of their chronic comorbidities is felt to place them at high risk for further clinical deterioration. Furthermore, it is not anticipated that the patient will be medically stable for discharge from the hospital within 2 midnights of admission. The following factors support the patient status of inpatient.   "           The patient's presenting symptoms include severe low back pain Patient has fracture of the lumbar spine T12  * I certify that at the point of admission it is my clinical judgment that the patient will require inpatient hospital care spanning beyond 2 midnights from the point of admission due to high intensity of service, high risk for further deterioration and high frequency of surveillance required.*  Family Communication RN has spoken with family this AM and notified about possibility of kyphoplasty after MRI.  Disposition Plan: To be determined after kyphoplasty, possibly SNF.  Consultants:  IR  Procedures:  None  Antimicrobials:  None  DVT prophylaxis: SCDs defer restarting chemical  DVT PPX to IR   Objective: Vitals:   07/09/19 0550 07/09/19 0608 07/09/19 0639 07/09/19 0820  BP:   (!) 160/49 (!) 207/62  Pulse:   (!) 55 61  Resp:    17  Temp:    97.7 F (36.5 C)  TempSrc:    Oral  SpO2:   99% 100%  Weight: 44.6 kg 44.6 kg      Intake/Output Summary (Last 24 hours) at 07/09/2019 0840 Last data filed at 07/09/2019 0507 Gross per 24 hour  Intake 1140.53 ml  Output 400 ml  Net 740.53 ml   Filed Weights   07/09/19 0550 07/09/19 0608  Weight: 44.6 kg 44.6 kg    Exam:  . General: 85 y.o. year-old female well developed well nourished in no acute distress.  Alert and very hard of hearing. . Cardiovascular: Regular rate and rhythm with no rubs or gallops.  No thyromegaly or JVD noted.   Marland Kitchen Respiratory: Clear to auscultation with no wheezes or rales. Good inspiratory effort. . Abdomen: Soft nontender nondistended with normal bowel sounds x4 quadrants. . Musculoskeletal: No lower extremity edema. 2/4 pulses in all 4 extremities. Marland Kitchen Psychiatry: Mood is appropriate for condition and setting   Data Reviewed: CBC: Recent Labs  Lab 07/03/19 2301 07/08/19 0618 07/09/19 0348  WBC 8.9 7.5 9.5  NEUTROABS 6.5  --   --   HGB 10.7* 11.0* 10.7*  HCT 30.7* 32.2* 30.8*  MCV 100.7* 102.2* 101.7*  PLT 237 364 932   Basic Metabolic Panel: Recent Labs  Lab 07/03/19 2301 07/08/19 0618 07/09/19 0348  NA 130* 130* 131*  K 4.6 4.9 4.8  CL 99 99 100  CO2 22 23 21*  GLUCOSE 110* 109* 101*  BUN 12 18 22   CREATININE 1.06* 0.99 1.05*  CALCIUM 8.9 8.9 8.7*  MG  --  2.1  --    GFR: CrCl cannot be calculated (Unknown ideal weight.). Liver Function Tests: No results for input(s): AST, ALT, ALKPHOS, BILITOT, PROT, ALBUMIN in the last 168 hours. No results for input(s): LIPASE, AMYLASE in the last 168 hours. No results for input(s): AMMONIA in the last 168 hours. Coagulation Profile: Recent Labs  Lab 07/08/19 0319  INR 1.0   Cardiac Enzymes: No results for  input(s): CKTOTAL, CKMB, CKMBINDEX, TROPONINI in the last 168 hours. BNP (last 3 results) No results for input(s): PROBNP in the last 8760 hours. HbA1C: No results for input(s): HGBA1C in the last 72 hours. CBG: No results for input(s): GLUCAP in the last 168 hours. Lipid Profile: No results for input(s): CHOL, HDL, LDLCALC, TRIG, CHOLHDL, LDLDIRECT in the last 72 hours. Thyroid Function Tests: No results for input(s): TSH, T4TOTAL, FREET4, T3FREE, THYROIDAB in the last 72 hours. Anemia Panel: No results for input(s): VITAMINB12, FOLATE, FERRITIN, TIBC, IRON, RETICCTPCT in the last 72 hours. Urine analysis:    Component Value Date/Time   COLORURINE STRAW (A) 07/08/2019 0350   APPEARANCEUR CLEAR 07/08/2019 0350   LABSPEC 1.009 07/08/2019 0350   PHURINE 7.0 07/08/2019 0350   GLUCOSEU NEGATIVE 07/08/2019 0350   HGBUR NEGATIVE 07/08/2019 0350   BILIRUBINUR NEGATIVE 07/08/2019 0350   KETONESUR NEGATIVE 07/08/2019 0350   PROTEINUR NEGATIVE 07/08/2019 0350   NITRITE NEGATIVE 07/08/2019  0350   LEUKOCYTESUR NEGATIVE 07/08/2019 0350   Sepsis Labs: @LABRCNTIP (procalcitonin:4,lacticidven:4)  ) Recent Results (from the past 240 hour(s))  SARS CORONAVIRUS 2 (TAT 6-24 HRS) Nasopharyngeal Nasopharyngeal Swab     Status: None   Collection Time: 07/03/19 11:01 PM   Specimen: Nasopharyngeal Swab  Result Value Ref Range Status   SARS Coronavirus 2 NEGATIVE NEGATIVE Final    Comment: (NOTE) SARS-CoV-2 target nucleic acids are NOT DETECTED. The SARS-CoV-2 RNA is generally detectable in upper and lower respiratory specimens during the acute phase of infection. Negative results do not preclude SARS-CoV-2 infection, do not rule out co-infections with other pathogens, and should not be used as the sole basis for treatment or other patient management decisions. Negative results must be combined with clinical observations, patient history, and epidemiological information. The expected result is  Negative. Fact Sheet for Patients: HairSlick.nohttps://www.fda.gov/media/138098/download Fact Sheet for Healthcare Providers: quierodirigir.comhttps://www.fda.gov/media/138095/download This test is not yet approved or cleared by the [place] FDA and  has been authorized for detection and/or diagnosis of SARS-CoV-2 by FDA under an Emergency Use Authorization (EUA). This EUA will remain  in effect (meaning this test can be used) for the duration of the COVID-19 declaration under Section 56 4(b)(1) of the Act, 21 U.S.C. section 360bbb-3(b)(1), unless the authorization is terminated or revoked sooner. Performed at [facility], [address] [place], [place] [address]   Urine culture     Status: Abnormal   Collection Time: 07/04/19  3:50 AM   Specimen: Urine, Random  Result Value Ref Range Status   Specimen Description URINE, RANDOM  Final   Special Requests   Final    NONE Performed at [facility], [address] [place], [place] [address]    Culture >=100,000 COLONIES/mL ESCHERICHIA COLI (A)  Final   Report Status 07/06/2019 FINAL  Final   Organism ID, Bacteria ESCHERICHIA COLI (A)  Final      Susceptibility   Escherichia coli - MIC*    AMPICILLIN <=2 SENSITIVE Sensitive     CEFAZOLIN <=4 SENSITIVE Sensitive     CEFTRIAXONE <=1 SENSITIVE Sensitive     CIPROFLOXACIN <=0.25 SENSITIVE Sensitive     GENTAMICIN <=1 SENSITIVE Sensitive     IMIPENEM <=0.25 SENSITIVE Sensitive     NITROFURANTOIN <=16 SENSITIVE Sensitive     TRIMETH/SULFA <=20 SENSITIVE Sensitive     AMPICILLIN/SULBACTAM <=2 SENSITIVE Sensitive     PIP/TAZO <=4 SENSITIVE Sensitive     * >=100,000 COLONIES/mL ESCHERICHIA COLI      Studies: IR VERTEBROPLASTY CERV/THOR BX INC UNI/BIL INC/INJECT/IMAGING  Result Date: 07/08/2019 CLINICAL DATA:  Severe low back pain secondary to compression fracture at T12. INDICATION: Severe low back pain secondary to compression fracture at T12. EXAM: VERTEBROPLASTY AT T12 WITH BIOPSY  MEDICATIONS: As antibiotic prophylaxis, Ancef 1 g IV was ordered pre-procedure and administered intravenously within 1 hour of incision. ANESTHESIA/SEDATION: Moderate (conscious) sedation was employed during this procedure. A total of Versed 1 mg and Fentanyl 25 mcg was administered intravenously. Moderate Sedation Time: 21 minutes. The patient's level of consciousness and vital signs were monitored continuously by radiology nursing throughout the procedure under my direct supervision. FLUOROSCOPY TIME:  Fluoroscopy Time: 8 minutes 36 seconds (339 mGy) COMPLICATIONS: None immediate. TECHNIQUE: Informed written consent was obtained from the patient after a thorough discussion of the procedural risks, benefits and alternatives. All questions were addressed. Maximal Sterile Barrier Technique was utilized including caps, mask, sterile gowns, sterile gloves, sterile drape, hand hygiene and skin  antiseptic. A timeout was performed prior to the initiation of the procedure. PROCEDURE: The patient was placed prone on the fluoroscopic table. Nasal oxygen was administered. Physiologic monitoring was performed throughout the duration of the procedure. The skin overlying the thoracolumbar region was prepped and draped in the usual sterile fashion. The T12 vertebral body was identified and the right pedicle was infiltrated with 0.25% Bupivacaine. This was then followed by the advancement of a 13-gauge Cook needle through the right pedicle into the posterior one-third at T12. A 16 gauge core biopsy was then advanced to anterior 1/3 at T12. Using a 20 mL syringe, a core biopsy was obtained and sent for pathologic analysis as per request of the referring MD. The 13 gauge Cook spinal needle was then advanced more distally into the anterior 1/3. A gentle contrast injection demonstrated trabecular pattern of contrast enhancement. Methylmethacrylate mixture was reconstituted with tobramycin in the Stryker delivery device system. Using  biplane intermittent fluoroscopy, methylmethacrylate mixture was then injected with excellent filling on the AP and lateral projections. No extravasation was noted into the disk spaces or posteriorly into the spinal canal. No epidural venous contamination was seen. The needle was then removed. Hemostasis was achieved at the skin entry site. There were no acute complications. Patient tolerated the procedure well. The patient was then returned to the floor in stable condition. IMPRESSION: 1. Status post vertebral body augmentation for painful compression fracture at T12 using vertebroplasty technique. 2. Proceeded by core biopsy at T12 as per request of referring MD. Electronically Signed   By: Julieanne Cotton M.D.   On: 07/08/2019 10:42    Scheduled Meds: . amLODipine  5 mg Oral Daily  . docusate sodium  100 mg Oral Daily  . folic acid  1 mg Oral Daily  . lisinopril  20 mg Oral Daily  . metoprolol succinate  50 mg Oral QPM  . polyethylene glycol  17 g Oral Daily  . spironolactone  25 mg Oral Daily    Continuous Infusions: . sodium chloride 50 mL/hr at 07/09/19 0640  .  ceFAZolin (ANCEF) IV 1 g (07/09/19 6629)     LOS: 5 days     Levie Heritage, MD [facility] Pager [phone]  If 7PM-7AM, please contact night-coverage [email or web address] Password [email or web address] 07/09/2019, 8:40 AM

## 2019-07-09 NOTE — Plan of Care (Signed)

## 2019-07-09 NOTE — Plan of Care (Signed)
  Problem: Education: Goal: Knowledge of General Education information will improve Description: Including pain rating scale, medication(s)/side effects and non-pharmacologic comfort measures Outcome: Progressing   Problem: Health Behavior/Discharge Planning: Goal: Ability to manage health-related needs will improve Outcome: Progressing   Problem: Nutrition: Goal: Adequate nutrition will be maintained Outcome: Progressing   Problem: Coping: Goal: Level of anxiety will decrease Outcome: Progressing   Problem: Elimination: Goal: Will not experience complications related to bowel motility Outcome: Progressing Goal: Will not experience complications related to urinary retention Outcome: Progressing   

## 2019-07-10 DIAGNOSIS — L8990 Pressure ulcer of unspecified site, unspecified stage: Secondary | ICD-10-CM

## 2019-07-10 LAB — BASIC METABOLIC PANEL
Anion gap: 10 (ref 5–15)
BUN: 16 mg/dL (ref 8–23)
CO2: 22 mmol/L (ref 22–32)
Calcium: 9 mg/dL (ref 8.9–10.3)
Chloride: 95 mmol/L — ABNORMAL LOW (ref 98–111)
Creatinine, Ser: 1.05 mg/dL — ABNORMAL HIGH (ref 0.44–1.00)
GFR calc Af Amer: 56 mL/min — ABNORMAL LOW (ref >60)
GFR calc non Af Amer: 48 mL/min — ABNORMAL LOW (ref >60)
Glucose, Bld: 87 mg/dL (ref 70–99)
Potassium: 4.3 mmol/L (ref 3.5–5.1)
Sodium: 127 mmol/L — ABNORMAL LOW (ref 135–145)

## 2019-07-10 NOTE — Progress Notes (Addendum)
PROGRESS NOTE  Orpah Greekellie Lea Hove ZOX:[id number]:[id number] DOB: 11/12/1930 DOA: 07/03/2019 PCP: Aida PufferLittle, James, MD  HPI/Recap of past 24 hours: This is an 85 year old female with history of hypertension, paroxysmal tachycardia, chronic kidney disease stage III who was brought to the emergency department for the second time in 1 week because of worsening back pain.  Apparently a week ago patient was bending to do something and then she felt a pop in her mid back and started having severe pain she was seen at the emergency department a CT scan was done which showed T12 endplate fracture and advised to stop brace and follow-up with neurosurgery on June 29, 2019.  She returns to the emergency department because of pain has not recently worsened, had Lumbar MRI 12/14 that confirms acute compression fracture T12 with further loss of height.  07/10/19: Doing well. Limited walking. Pain controlled. C/o constipation, but nurses report no problems with stooling.   Assessment/Plan: Principal Problem:   Mid back pain Active Problems:   Paroxysmal supraventricular tachycardia (HCC)   Closed T12 fracture (HCC)   Hypertensive urgency   Back pain   Pressure injury of skin   Compression fracture of T12 post vertebroplasty and biopsy on 07/08/2019 by IR.  Presented with severe mid back pain, could not wear her back brace due to severe pain.  T12 vertebroplasty and biopsy done on 07/08/19 by IR.  Follow-up biopsy.  Pain management and bowel regimen in place, continue  Likely discharge to SNF in AM.  Will get updated PT/OT note.  E. coli UTI  Urine culture taken on 07/04/2019 showed greater than 100,000 colonies of E. coli pansensitive  Continue Ancef 3 times daily, started on 07/06/2019.  Continue for 7 days total - change to keflex 500mg  TID.  Hypovolemic hyponatremia  Sodium 130 and hypovolemic on exam  IVF: gentle IV fluid hydration normal saline at 50 cc/h x 1 day.  Slightly improved.  Continue with gentle hydration.  Stable  Uncontrolled hypertension, possibly contributed by pain  Control her pain with pain management  Continue Norvasc 5 mg, lisinopril 20 mg daily, Toprol-XL 50 mg nightly, spironolactone 25 mg daily  IV labetalol as needed with parameters  Continue to monitor vital signs  Paroxysmal supraventricular tachycardia  Rate controlled on metoprolol  CKD 3  She appears to be at her baseline creatinine 0.9 with GFR 52  Continue to avoid nephrotoxins  Macrocytic anemia  Hg stable  Continue to monitor  Code Status: DNR  Severity of Illness: The appropriate patient status for this patient is INPATIENT. Inpatient status is judged to be reasonable and necessary in order to provide the required intensity of service to ensure the patient's safety. The patient's presenting symptoms, physical exam findings, and initial radiographic and laboratory data in the context of their chronic comorbidities is felt to place them at high risk for further clinical deterioration. Furthermore, it is not anticipated that the patient will be medically stable for discharge from the hospital within 2 midnights of admission. The following factors support the patient status of inpatient.   "           The patient's presenting symptoms include severe low back pain Patient has fracture of the lumbar spine T12  * I certify that at the point of admission it is my clinical judgment that the patient will require inpatient hospital care spanning beyond 2 midnights from the point of admission due to high intensity of service, high risk for further deterioration and high frequency  of surveillance required.*   Family Communication RN has spoken with family this AM and notified about possibility of kyphoplasty after MRI.  Disposition Plan: To be determined after kyphoplasty, possibly SNF.  Consultants:  IR  Procedures:  None  Antimicrobials:  None  DVT  prophylaxis: SCDs defer restarting chemical DVT PPX to IR   Objective: Vitals:   07/09/19 1554 07/09/19 1607 07/09/19 2029 07/10/19 0758  BP:  (!) 156/57 (!) 145/56 (!) 183/66  Pulse:  62 (!) 57 66  Resp:  17 17 16   Temp:  98.2 F (36.8 C) 98.4 F (36.9 C) 98.2 F (36.8 C)  TempSrc:  Oral Oral Oral  SpO2:  100% 99% 95%  Weight: 44.6 kg     Height: 5' (1.524 m)       Intake/Output Summary (Last 24 hours) at 07/10/2019 1046 Last data filed at 07/10/2019 0900 Gross per 24 hour  Intake 290 ml  Output 501 ml  Net -211 ml   Filed Weights   07/09/19 0550 07/09/19 0608 07/09/19 1554  Weight: 44.6 kg 44.6 kg 44.6 kg    Exam:  . General: 85 y.o. year-old female well developed well nourished in no acute distress.  Alert and very hard of hearing. . Cardiovascular: Regular rate and rhythm with no rubs or gallops.  No JVD noted.   Marland Kitchen Respiratory: Clear to auscultation with no wheezes or rales. Good inspiratory effort. . Abdomen: Soft nontender nondistended with normal bowel sounds x4 quadrants. . Musculoskeletal: No lower extremity edema. 2/4 pulses in all 4 extremities. Marland Kitchen Psychiatry: Mood is appropriate for condition and setting   Data Reviewed: CBC: Recent Labs  Lab 07/03/19 2301 07/08/19 0618 07/09/19 0348  WBC 8.9 7.5 9.5  NEUTROABS 6.5  --   --   HGB 10.7* 11.0* 10.7*  HCT 30.7* 32.2* 30.8*  MCV 100.7* 102.2* 101.7*  PLT 237 364 161   Basic Metabolic Panel: Recent Labs  Lab 07/03/19 2301 07/08/19 0618 07/09/19 0348  NA 130* 130* 131*  K 4.6 4.9 4.8  CL 99 99 100  CO2 22 23 21*  GLUCOSE 110* 109* 101*  BUN 12 18 22   CREATININE 1.06* 0.99 1.05*  CALCIUM 8.9 8.9 8.7*  MG  --  2.1  --    GFR: Estimated Creatinine Clearance: 27.1 mL/min (A) (by C-G formula based on SCr of 1.05 mg/dL (H)). Liver Function Tests: No results for input(s): AST, ALT, ALKPHOS, BILITOT, PROT, ALBUMIN in the last 168 hours. No results for input(s): LIPASE, AMYLASE in the last 168  hours. No results for input(s): AMMONIA in the last 168 hours. Coagulation Profile: Recent Labs  Lab 07/08/19 0319  INR 1.0   Cardiac Enzymes: No results for input(s): CKTOTAL, CKMB, CKMBINDEX, TROPONINI in the last 168 hours. BNP (last 3 results) No results for input(s): PROBNP in the last 8760 hours. HbA1C: No results for input(s): HGBA1C in the last 72 hours. CBG: No results for input(s): GLUCAP in the last 168 hours. Lipid Profile: No results for input(s): CHOL, HDL, LDLCALC, TRIG, CHOLHDL, LDLDIRECT in the last 72 hours. Thyroid Function Tests: No results for input(s): TSH, T4TOTAL, FREET4, T3FREE, THYROIDAB in the last 72 hours. Anemia Panel: No results for input(s): VITAMINB12, FOLATE, FERRITIN, TIBC, IRON, RETICCTPCT in the last 72 hours. Urine analysis:    Component Value Date/Time   COLORURINE STRAW (A) 07/08/2019 0350   APPEARANCEUR CLEAR 07/08/2019 0350   LABSPEC 1.009 07/08/2019 0350   PHURINE 7.0 07/08/2019 0350   GLUCOSEU NEGATIVE 07/08/2019  0350   HGBUR NEGATIVE 07/08/2019 0350   BILIRUBINUR NEGATIVE 07/08/2019 0350   KETONESUR NEGATIVE 07/08/2019 0350   PROTEINUR NEGATIVE 07/08/2019 0350   NITRITE NEGATIVE 07/08/2019 0350   LEUKOCYTESUR NEGATIVE 07/08/2019 0350   Sepsis Labs: @LABRCNTIP (procalcitonin:4,lacticidven:4)  ) Recent Results (from the past 240 hour(s))  SARS CORONAVIRUS 2 (TAT 6-24 HRS) Nasopharyngeal Nasopharyngeal Swab     Status: None   Collection Time: 07/03/19 11:01 PM   Specimen: Nasopharyngeal Swab  Result Value Ref Range Status   SARS Coronavirus 2 NEGATIVE NEGATIVE Final    Comment: (NOTE) SARS-CoV-2 target nucleic acids are NOT DETECTED. The SARS-CoV-2 RNA is generally detectable in upper and lower respiratory specimens during the acute phase of infection. Negative results do not preclude SARS-CoV-2 infection, do not rule out co-infections with other pathogens, and should not be used as the sole basis for treatment or other  patient management decisions. Negative results must be combined with clinical observations, patient history, and epidemiological information. The expected result is Negative. Fact Sheet for Patients: 14/11/20 Fact Sheet for Healthcare Providers: HairSlick.no This test is not yet approved or cleared by the [place] FDA and  has been authorized for detection and/or diagnosis of SARS-CoV-2 by FDA under an Emergency Use Authorization (EUA). This EUA will remain  in effect (meaning this test can be used) for the duration of the COVID-19 declaration under Section 56 4(b)(1) of the Act, 21 U.S.C. section 360bbb-3(b)(1), unless the authorization is terminated or revoked sooner. Performed at [facility], [address], [place], [place]   Urine culture     Status: Abnormal   Collection Time: 07/04/19  3:50 AM   Specimen: Urine, Random  Result Value Ref Range Status   Specimen Description URINE, RANDOM  Final   Special Requests   Final    NONE Performed at [facility], [address], [place], [place]    Culture >=100,000 COLONIES/mL ESCHERICHIA COLI (A)  Final   Report Status 07/06/2019 FINAL  Final   Organism ID, Bacteria ESCHERICHIA COLI (A)  Final      Susceptibility   Escherichia coli - MIC*    AMPICILLIN <=2 SENSITIVE Sensitive     CEFAZOLIN <=4 SENSITIVE Sensitive     CEFTRIAXONE <=1 SENSITIVE Sensitive     CIPROFLOXACIN <=0.25 SENSITIVE Sensitive     GENTAMICIN <=1 SENSITIVE Sensitive     IMIPENEM <=0.25 SENSITIVE Sensitive     NITROFURANTOIN <=16 SENSITIVE Sensitive     TRIMETH/SULFA <=20 SENSITIVE Sensitive     AMPICILLIN/SULBACTAM <=2 SENSITIVE Sensitive     PIP/TAZO <=4 SENSITIVE Sensitive     * >=100,000 COLONIES/mL ESCHERICHIA COLI      Studies: No results found.  Scheduled Meds: . amLODipine  5 mg Oral Daily  . cephALEXin  500 mg Oral TID  . docusate sodium   100 mg Oral Daily  . folic acid  1 mg Oral Daily  . lisinopril  20 mg Oral Daily  . metoprolol succinate  50 mg Oral QPM  . polyethylene glycol  17 g Oral Daily  . spironolactone  25 mg Oral Daily    Continuous Infusions:    LOS: 6 days     07/08/2019, MD [facility] Pager [phone]  If 7PM-7AM, please contact night-coverage [email or web address] Password [email or web address] 07/10/2019, 10:46 AM

## 2019-07-10 NOTE — Progress Notes (Signed)
Physical Therapy Treatment Patient Details Name: Alicia Schwartz MRN: [id number] DOB: 12/25/1930 Today's Date: 07/10/2019    History of Present Illness Pt is an 85 y/o female admitted secondary to worsening back pain. Pt presented to ER ~1 week ago with back pain from lifting/moving a table and then hearing a pop. At that time, pt found to have a T12 endplate fx. No surgery performed. Pt now s/p kyphoplasty on 12/16 by IR.  PMH including but not limited to HTN and CKD.    PT Comments    Pt greatly limited secondary to pain and cognitive deficits. Pt also very HOH and difficult to direct. She was able to transfer from bed to chair with min A and use of RW. Continue to recommend pt d/c to SNF for further rehab. Pt would continue to benefit from skilled physical therapy services at this time while admitted and after d/c to address the below listed limitations in order to improve overall safety and independence with functional mobility.    Follow Up Recommendations  SNF     Equipment Recommendations  None recommended by PT    Recommendations for Other Services       Precautions / Restrictions Precautions Precautions: Back;Fall Restrictions Weight Bearing Restrictions: No    Mobility  Bed Mobility Overal bed mobility: Needs Assistance Bed Mobility: Supine to Sit     Supine to sit: Min guard     General bed mobility comments: pt achieving upright sitting at EOB towards her L side, min guard for safety, no physical assistance needed  Transfers Overall transfer level: Needs assistance Equipment used: Rolling walker (2 wheeled) Transfers: Sit to/from [employer] Sit to Stand: Min assist Stand pivot transfers: Min assist       General transfer comment: assistance for stability with transitional movement  Ambulation/Gait                 Stairs             Wheelchair Mobility    Modified Rankin (Stroke Patients Only)       Balance  Overall balance assessment: Needs assistance Sitting-balance support: No upper extremity supported;Feet supported Sitting balance-Leahy Scale: Fair     Standing balance support: Bilateral upper extremity supported Standing balance-Leahy Scale: Poor                              Cognition Arousal/Alertness: Awake/alert Behavior During Therapy: Anxious;Restless Overall Cognitive Status: Impaired/Different from baseline Area of Impairment: Attention;Memory;Following commands;Safety/judgement;Awareness;Problem solving                   Current Attention Level: Selective Memory: Decreased recall of precautions;Decreased short-term memory Following Commands: Follows one step commands with increased time;Follows one step commands inconsistently Safety/Judgement: Decreased awareness of deficits;Decreased awareness of safety Awareness: Intellectual Problem Solving: Difficulty sequencing;Requires verbal cues        Exercises      General Comments        Pertinent Vitals/Pain Pain Assessment: Faces Faces Pain Scale: Hurts little more Pain Location: back Pain Descriptors / Indicators: Guarding;Sore Pain Intervention(s): Monitored during session;Repositioned    Home Living                      Prior Function            PT Goals (current goals can now be found in the care plan section) Acute Rehab PT Goals PT  Goal Formulation: With patient Time For Goal Achievement: 07/18/19 Potential to Achieve Goals: Fair Progress towards PT goals: Progressing toward goals    Frequency    Min 2X/week      PT Plan Current plan remains appropriate    Co-evaluation              AM-PAC PT "6 Clicks" Mobility   Outcome Measure  Help needed turning from your back to your side while in a flat bed without using bedrails?: None Help needed moving from lying on your back to sitting on the side of a flat bed without using bedrails?: None Help needed  moving to and from a bed to a chair (including a wheelchair)?: A Little Help needed standing up from a chair using your arms (e.g., wheelchair or bedside chair)?: A Little Help needed to walk in hospital room?: A Little Help needed climbing 3-5 steps with a railing? : A Lot 6 Click Score: 19    End of Session   Activity Tolerance: Patient limited by pain Patient left: in chair;with call bell/phone within reach;with chair alarm set Nurse Communication: Mobility status PT Visit Diagnosis: Other abnormalities of gait and mobility (R26.89);Pain Pain - part of body: (back)     Time: 1355-1405 PT Time Calculation (min) (ACUTE ONLY): 10 min  Charges:  $Therapeutic Activity: 8-22 mins                     Anastasio Champion, DPT  Acute Rehabilitation Services Pager [phone] Office [place] 07/10/2019, 5:08 PM

## 2019-07-10 NOTE — Plan of Care (Signed)

## 2019-07-10 NOTE — Progress Notes (Signed)
Pt c/o pain but would not allow me to medicate her.

## 2019-07-10 NOTE — Plan of Care (Signed)
  Problem: Education: Goal: Knowledge of General Education information will improve Description: Including pain rating scale, medication(s)/side effects and non-pharmacologic comfort measures Outcome: Progressing   Problem: Health Behavior/Discharge Planning: Goal: Ability to manage health-related needs will improve Outcome: Progressing   Problem: Activity: Goal: Risk for activity intolerance will decrease Outcome: Progressing   Problem: Nutrition: Goal: Adequate nutrition will be maintained Outcome: Progressing   Problem: Safety: Goal: Ability to remain free from injury will improve Outcome: Progressing   Problem: Coping: Goal: Level of anxiety will decrease Outcome: Not Progressing

## 2019-07-10 NOTE — TOC Progression Note (Signed)
Transition of Care [facility]) - Progression Note    Patient Details  Name: Alicia Schwartz MRN: [id number] Date of Birth: [birth year]  Transition of Care [facility]) CM/SW [place] MSN, RN, NCM-BC, [place] [phone] Phone Number: 07/10/2019, 2:30 PM  Clinical Narrative:    Per Dr. Nehemiah Settle; patient is medically stable to transition to [facility] SNF for [place] re[facility]. Patient will need updated PT/OT notes for insurance authorization with [employer]. PT/OT eval ordered by Dr. Nehemiah Settle with evals pending. CM team will continue to follow.    Expected Discharge Plan: Skilled Nursing Facility Barriers to Discharge: Continued Medical Work up  Expected Discharge Plan and Services Expected Discharge Plan: [place]     Social Determinants of Health (SDOH) Interventions    Readmission Risk Interventions No flowsheet data found.

## 2019-07-11 DIAGNOSIS — E871 Hypo-osmolality and hyponatremia: Secondary | ICD-10-CM

## 2019-07-11 LAB — BASIC METABOLIC PANEL
Anion gap: 9 (ref 5–15)
BUN: 20 mg/dL (ref 8–23)
CO2: 19 mmol/L — ABNORMAL LOW (ref 22–32)
Calcium: 8.7 mg/dL — ABNORMAL LOW (ref 8.9–10.3)
Chloride: 99 mmol/L (ref 98–111)
Creatinine, Ser: 0.98 mg/dL (ref 0.44–1.00)
GFR calc Af Amer: >60 mL/min (ref >60)
GFR calc non Af Amer: 52 mL/min — ABNORMAL LOW (ref >60)
Glucose, Bld: 100 mg/dL — ABNORMAL HIGH (ref 70–99)
Potassium: 4.9 mmol/L (ref 3.5–5.1)
Sodium: 127 mmol/L — ABNORMAL LOW (ref 135–145)

## 2019-07-11 LAB — CREATININE, SERUM
Creatinine, Ser: 1.1 mg/dL — ABNORMAL HIGH (ref 0.44–1.00)
GFR calc Af Amer: 53 mL/min — ABNORMAL LOW (ref >60)
GFR calc non Af Amer: 45 mL/min — ABNORMAL LOW (ref >60)

## 2019-07-11 LAB — SARS CORONAVIRUS 2 (TAT 6-24 HRS): SARS Coronavirus 2: NEGATIVE

## 2019-07-11 MED ORDER — AMLODIPINE BESYLATE 10 MG PO TABS
10.0000 mg | ORAL_TABLET | Freq: Every day | ORAL | Status: DC
  Administered 2019-07-11 – 2019-07-12 (×2): 10 mg via ORAL
  Filled 2019-07-11 (×2): qty 1

## 2019-07-11 MED ORDER — SODIUM CHLORIDE 0.9 % IV SOLN: INTRAVENOUS | Status: DC

## 2019-07-11 NOTE — Progress Notes (Signed)
Occupational Therapy Evaluation Patient Details Name: Alicia Schwartz MRN: [id number] DOB: June 15, 1931 Today's Date: 07/11/2019    History of Present Illness Pt is an 85 y/o female admitted secondary to worsening back pain. Pt presented to ER ~1 week ago with back pain from lifting/moving a table and then hearing a pop. At that time, pt found to have a T12 endplate fx. No surgery performed. Pt now s/p kyphoplasty on 12/16 by IR.  PMH including but not limited to HTN and CKD.   Clinical Impression   Pt presents with above diagnosis. PTA pt PLOF living alone, however, requiring assistance as needed from daughter in home environment. Pt is HOH so unable to fully comprehend questions asked. Pt currently limited with ADLs due to pain and decreased stability with transfers. Pt will benefit from additional acute OT to address safety, cognition, AE education, and pain management. DC recommendation to SNF to ensure increased safety and strength prior to return to home setting.     Follow Up Recommendations  SNF;Supervision/Assistance - 24 hour    Equipment Recommendations  3 in 1 bedside commode    Recommendations for Other Services       Precautions / Restrictions Precautions Precautions: Back;Fall Required Braces or Orthoses: (niece took brace home) Spinal Brace: Thoracolumbosacral orthotic;Other (comment) Spinal Brace Comments: TLSO not present during evaluation, pt educated of back precautions to pre vent further pain. Restrictions Weight Bearing Restrictions: No      Mobility Bed Mobility Overal bed mobility: Needs Assistance Bed Mobility: Supine to Sit Rolling: Min assist   Supine to sit: Min assist     General bed mobility comments: Min A for safety but no physical assistance required exiting bed, only for entering to manage BLE.   Transfers Overall transfer level: Needs assistance Equipment used: Rolling walker (2 wheeled) Transfers: Sit to/from [employer] Sit to Stand: Mod assist Stand pivot transfers: Mod assist       General transfer comment: assistance for stability with transitional movement, cueing for hand placement and safety with sequence,    Balance Overall balance assessment: Needs assistance Sitting-balance support: No upper extremity supported;Feet supported Sitting balance-Leahy Scale: Fair     Standing balance support: Bilateral upper extremity supported Standing balance-Leahy Scale: Poor                             ADL either performed or assessed with clinical judgement   ADL Overall ADL's : Needs assistance/impaired Eating/Feeding: Set up Eating/Feeding Details (indicate cue type and reason): bed level Grooming: Wash/dry hands;Wash/dry face;Oral care;Set up Grooming Details (indicate cue type and reason): bed level Upper Body Bathing: Modified independent   Lower Body Bathing: Maximal assistance   Upper Body Dressing : Modified independent   Lower Body Dressing: Maximal assistance   Toilet Transfer: Moderate assistance;Maximal assistance Toilet Transfer Details (indicate cue type and reason): BSC transfer from bed with stand pivot and RW. VC's for hand placement and sequencing. Pt HOH unable to comprehends cues given.  Toileting- Clothing Manipulation and Hygiene: Moderate assistance;Maximal assistance;With adaptive equipment;Cueing for safety;Cueing for sequencing Toileting - Clothing Manipulation Details (indicate cue type and reason): Pt limited with reaching forward to don brief.      Functional mobility during ADLs: Moderate assistance;Maximal assistance;Cueing for safety;Cueing for sequencing;Rolling walker General ADL Comments: Max cueing for safety and sequencing.      Vision         Perception  Praxis      Pertinent Vitals/Pain Pain Assessment: 0-10 Pain Score: 2  Pain Location: back Pain Descriptors / Indicators: Guarding;Sore Pain Intervention(s): Monitored  during session;Repositioned     Hand Dominance Right   Extremity/Trunk Assessment Upper Extremity Assessment Upper Extremity Assessment: Generalized weakness   Lower Extremity Assessment Lower Extremity Assessment: Defer to PT evaluation   Cervical / Trunk Assessment Cervical / Trunk Assessment: Kyphotic   Communication Communication Communication: HOH   Cognition Arousal/Alertness: Awake/alert Behavior During Therapy: Anxious;Restless Overall Cognitive Status: Impaired/Different from baseline Area of Impairment: Attention;Memory;Following commands;Safety/judgement;Awareness;Problem solving                 Orientation Level: Disoriented to;Situation Current Attention Level: Selective Memory: Decreased recall of precautions;Decreased short-term memory Following Commands: Follows one step commands with increased time;Follows one step commands inconsistently Safety/Judgement: Decreased awareness of deficits;Decreased awareness of safety Awareness: Intellectual Problem Solving: Difficulty sequencing;Requires verbal cues     General Comments  Pt observed to be very emotional    Exercises     Shoulder Instructions      Home Living Family/patient expects to be discharged to:: Skilled nursing facility Living Arrangements: Other (Comment)                               Additional Comments: family really wants pt to d/c to a SNF      Prior Functioning/Environment Level of Independence: Independent with assistive device(s)                 OT Problem List: Decreased strength;Decreased activity tolerance;Impaired balance (sitting and/or standing);Decreased safety awareness;Decreased cognition;Decreased knowledge of use of DME or AE;Decreased knowledge of precautions;Pain      OT Treatment/Interventions: Self-care/ADL training;Therapeutic exercise;DME and/or AE instruction;Therapeutic activities;Cognitive remediation/compensation;Patient/family  education;Balance training    OT Goals(Current goals can be found in the care plan section) Acute Rehab OT Goals Patient Stated Goal: decrease pain OT Goal Formulation: With patient Time For Goal Achievement: 07/25/19 Potential to Achieve Goals: Fair  OT Frequency: Min 2X/week   Barriers to D/C: Decreased caregiver support;Inaccessible home environment          Co-evaluation              AM-PAC OT "6 Clicks" Daily Activity     Outcome Measure Help from another person eating meals?: A Little Help from another person taking care of personal grooming?: A Little Help from another person toileting, which includes using toliet, bedpan, or urinal?: A Lot Help from another person bathing (including washing, rinsing, drying)?: A Lot Help from another person to put on and taking off regular upper body clothing?: A Little Help from another person to put on and taking off regular lower body clothing?: A Lot 6 Click Score: 15   End of Session Equipment Utilized During Treatment: Gait belt;Rolling walker(no back brace present during eval) Nurse Communication: Mobility status;Precautions  Activity Tolerance: Patient tolerated treatment well Patient left: in bed;with bed alarm set;with call bell/phone within reach  OT Visit Diagnosis: Unsteadiness on feet (R26.81);Muscle weakness (generalized) (M62.81);Pain;Other abnormalities of gait and mobility (R26.89) Pain - part of body: (back)                Time: 5697-9480 OT Time Calculation (min): 28 min Charges:  OT General Charges $OT Visit: 1 Visit OT Evaluation $OT Eval Low Complexity: 1 Low OT Treatments $Self Care/Home Management : 8-22 mins  [place] Old, MSOT, OTR/L  Supplemental Rehabilitation  Services  [phone]   Zigmund DanielEvan M Nathaniel Yaden 07/11/2019, 11:10 AM

## 2019-07-11 NOTE — Progress Notes (Signed)
PROGRESS NOTE    Soma Bachand   WNU:[id number]  DOB: 1931-07-16  DOA: 07/03/2019 PCP: Tamsen Roers, MD   Brief Narrative:  Sanyah Molnar  is an 85 year old female with history of hypertension, paroxysmal tachycardia, chronic kidney disease stage III who was brought to the emergency department for the second time in 1 week because of worsening back pain. Apparently a week ago patient was bending to do something and then she felt a pop in her mid back and started having severe pain she was seen at the emergency department a CT scan was done which showed T12 endplate fracture and advised to stop brace and follow-up with neurosurgery on June 29, 2019. She returns to the emergency department because of pain has not recently worsened, had Lumbar MRI 12/14 that confirms acute compression fracture T12 with further loss of height.   Subjective: Patient has no new complaints.  She has ongoing back pain and states that she is unable to walk.    Assessment & Plan:   Principal Problem:   Closed T12 fracture  -With severe back pain and limited ability to ambulate -12/16-the patient underwent a vertebroplasty -He and OT eval's, the plan for her is to transition to SNF -Continues to need IV morphine and oxycodone for pain control -Covid test ordered  Active Problems:   Hyponatremia -She has some mild chronic hyponatremia but suspect acute hyponatremia is related to poor oral intake -I have started normal saline and asked her to increase dietary intake of sodium - I am not sure why she is on Aldactone (no h/o CHF)- will d/c this due to hyponatremia/ dehydration  E. coli UTI - cont Keflex tomorrow will be the last dose    Paroxysmal supraventricular tachycardia - cont Metoprolol    Hypertensive urgency -cont Metoprolol, Amlodipine, Lisinopril-I have increased her amlodipine today- d/c Aldactone as mentioned above      Time spent in minutes: 35 DVT prophylaxis:  SCDs Code Status: DO NOT RESUSCITATE Family Communication:  Disposition Plan: Skilled nursing facility Consultants:   IR Procedures:   Vertebroplasty Antimicrobials:  Anti-infectives (From admission, onward)   Start     Dose/Rate Route Frequency Ordered Stop   07/09/19 1000  cephALEXin (KEFLEX) capsule 500 mg  Status:  Discontinued     500 mg Oral 3 times daily 07/09/19 0843 07/09/19 0843   07/09/19 1000  cephALEXin (KEFLEX) capsule 500 mg     500 mg Oral 3 times daily 07/09/19 0843 07/14/19 0959   07/08/19 0930  ceFAZolin (ANCEF) IVPB 1 g/50 mL premix     over 30 Minutes  Continuous PRN 07/08/19 0930 07/08/19 0930   07/08/19 0929  ceFAZolin (ANCEF) 2-4 GM/100ML-% IVPB    Note to Pharmacy: Manuela Neptune   : cabinet override      07/08/19 0929 07/08/19 2144   07/08/19 0928  tobramycin (NEBCIN) powder      Topical As needed 07/08/19 0929 07/08/19 0928   07/08/19 0835  tobramycin (NEBCIN) 1.2 g powder    Note to Pharmacy: Emeline General   : cabinet override      07/08/19 0835 07/08/19 2044   07/06/19 1730  ceFAZolin (ANCEF) IVPB 1 g/50 mL premix  Status:  Discontinued     1 g 100 mL/hr over 30 Minutes Intravenous Every 8 hours 07/06/19 1725 07/09/19 0843   07/05/19 1030  cefTRIAXone (ROCEPHIN) 1 g in sodium chloride 0.9 % 100 mL IVPB  Status:  Discontinued     1 g 200  mL/hr over 30 Minutes Intravenous Every 24 hours 07/05/19 0951 07/06/19 1725       Objective: Vitals:   07/10/19 2005 07/11/19 0401 07/11/19 0757 07/11/19 1035  BP: (!) 170/51 (!) 175/51 (!) 153/50 (!) 167/53  Pulse: 66 (!) 52 (!) 58 (!) 58  Resp:   17   Temp: (!) 97.4 F (36.3 C) 98.1 F (36.7 C) (!) 97.4 F (36.3 C)   TempSrc: Oral Oral Oral   SpO2: 96% 98% 100% 100%  Weight:      Height:        Intake/Output Summary (Last 24 hours) at 07/11/2019 1222 Last data filed at 07/10/2019 2130 Gross per 24 hour  Intake 480 ml  Output 300 ml  Net 180 ml   Filed Weights   07/09/19 0550 07/09/19 0608  07/09/19 1554  Weight: 44.6 kg 44.6 kg 44.6 kg    Examination: General exam: Appears comfortable  HEENT: PERRLA, oral mucosa moist, no sclera icterus or thrush Respiratory system: Clear to auscultation. Respiratory effort normal. Cardiovascular system: S1 & S2 heard, RRR.   Gastrointestinal system: Abdomen soft, non-tender, nondistended. Normal bowel sounds. Central nervous system: Alert and oriented. No focal neurological deficits. Extremities: No cyanosis, clubbing or edema Skin: No rashes or ulcers Psychiatry:  Mood & affect appropriate.     Data Reviewed: I have personally reviewed following labs and imaging studies  CBC: Recent Labs  Lab 07/08/19 0618 07/09/19 0348  WBC 7.5 9.5  HGB 11.0* 10.7*  HCT 32.2* 30.8*  MCV 102.2* 101.7*  PLT 364 366   Basic Metabolic Panel: Recent Labs  Lab 07/08/19 0618 07/09/19 0348 07/10/19 1218 07/11/19 0313  NA 130* 131* 127*  --   K 4.9 4.8 4.3  --   CL 99 100 95*  --   CO2 23 21* 22  --   GLUCOSE 109* 101* 87  --   BUN 18 22 16   --   CREATININE 0.99 1.05* 1.05* 1.10*  CALCIUM 8.9 8.7* 9.0  --   MG 2.1  --   --   --    GFR: Estimated Creatinine Clearance: 25.8 mL/min (A) (by C-G formula based on SCr of 1.1 mg/dL (H)). Liver Function Tests: No results for input(s): AST, ALT, ALKPHOS, BILITOT, PROT, ALBUMIN in the last 168 hours. No results for input(s): LIPASE, AMYLASE in the last 168 hours. No results for input(s): AMMONIA in the last 168 hours. Coagulation Profile: Recent Labs  Lab 07/08/19 0319  INR 1.0   Cardiac Enzymes: No results for input(s): CKTOTAL, CKMB, CKMBINDEX, TROPONINI in the last 168 hours. BNP (last 3 results) No results for input(s): PROBNP in the last 8760 hours. HbA1C: No results for input(s): HGBA1C in the last 72 hours. CBG: No results for input(s): GLUCAP in the last 168 hours. Lipid Profile: No results for input(s): CHOL, HDL, LDLCALC, TRIG, CHOLHDL, LDLDIRECT in the last 72  hours. Thyroid Function Tests: No results for input(s): TSH, T4TOTAL, FREET4, T3FREE, THYROIDAB in the last 72 hours. Anemia Panel: No results for input(s): VITAMINB12, FOLATE, FERRITIN, TIBC, IRON, RETICCTPCT in the last 72 hours. Urine analysis:    Component Value Date/Time   COLORURINE STRAW (A) 07/08/2019 0350   APPEARANCEUR CLEAR 07/08/2019 0350   LABSPEC 1.009 07/08/2019 0350   PHURINE 7.0 07/08/2019 0350   GLUCOSEU NEGATIVE 07/08/2019 0350   HGBUR NEGATIVE 07/08/2019 0350   BILIRUBINUR NEGATIVE 07/08/2019 0350   KETONESUR NEGATIVE 07/08/2019 0350   PROTEINUR NEGATIVE 07/08/2019 0350   NITRITE NEGATIVE 07/08/2019  0350   LEUKOCYTESUR NEGATIVE 07/08/2019 0350   Sepsis Labs: @LABRCNTIP (procalcitonin:4,lacticidven:4) ) Recent Results (from the past 240 hour(s))  SARS CORONAVIRUS 2 (TAT 6-24 HRS) Nasopharyngeal Nasopharyngeal Swab     Status: None   Collection Time: 07/03/19 11:01 PM   Specimen: Nasopharyngeal Swab  Result Value Ref Range Status   SARS Coronavirus 2 NEGATIVE NEGATIVE Final    Comment: (NOTE) SARS-CoV-2 target nucleic acids are NOT DETECTED. The SARS-CoV-2 RNA is generally detectable in upper and lower respiratory specimens during the acute phase of infection. Negative results do not preclude SARS-CoV-2 infection, do not rule out co-infections with other pathogens, and should not be used as the sole basis for treatment or other patient management decisions. Negative results must be combined with clinical observations, patient history, and epidemiological information. The expected result is Negative. Fact Sheet for Patients: HairSlick.nohttps://www.fda.gov/media/138098/download Fact Sheet for Healthcare Providers: quierodirigir.comhttps://www.fda.gov/media/138095/download This test is not yet approved or cleared by the [place] FDA and  has been authorized for detection and/or diagnosis of SARS-CoV-2 by FDA under an Emergency Use Authorization (EUA). This EUA will remain  in  effect (meaning this test can be used) for the duration of the COVID-19 declaration under Section 56 4(b)(1) of the Act, 21 U.S.C. section 360bbb-3(b)(1), unless the authorization is terminated or revoked sooner. Performed at [facility], [address] [place], [place] [address]   Urine culture     Status: Abnormal   Collection Time: 07/04/19  3:50 AM   Specimen: Urine, Random  Result Value Ref Range Status   Specimen Description URINE, RANDOM  Final   Special Requests   Final    NONE Performed at [facility], [address] [place], [place] [address]    Culture >=100,000 COLONIES/mL ESCHERICHIA COLI (A)  Final   Report Status 07/06/2019 FINAL  Final   Organism ID, Bacteria ESCHERICHIA COLI (A)  Final      Susceptibility   Escherichia coli - MIC*    AMPICILLIN <=2 SENSITIVE Sensitive     CEFAZOLIN <=4 SENSITIVE Sensitive     CEFTRIAXONE <=1 SENSITIVE Sensitive     CIPROFLOXACIN <=0.25 SENSITIVE Sensitive     GENTAMICIN <=1 SENSITIVE Sensitive     IMIPENEM <=0.25 SENSITIVE Sensitive     NITROFURANTOIN <=16 SENSITIVE Sensitive     TRIMETH/SULFA <=20 SENSITIVE Sensitive     AMPICILLIN/SULBACTAM <=2 SENSITIVE Sensitive     PIP/TAZO <=4 SENSITIVE Sensitive     * >=100,000 COLONIES/mL ESCHERICHIA COLI         Radiology Studies: No results found.    Scheduled Meds: . amLODipine  10 mg Oral Daily  . cephALEXin  500 mg Oral TID  . docusate sodium  100 mg Oral Daily  . folic acid  1 mg Oral Daily  . lisinopril  20 mg Oral Daily  . metoprolol succinate  50 mg Oral QPM  . polyethylene glycol  17 g Oral Daily  . spironolactone  25 mg Oral Daily   Continuous Infusions: . sodium chloride 100 mL/hr at 07/11/19 1040     LOS: 7 days      Calvert CantorSaima Jona Erkkila, MD [facility] Pager: [email or web address] Password [email or web address] 07/11/2019, 12:22 PM

## 2019-07-11 NOTE — Plan of Care (Signed)

## 2019-07-11 NOTE — Plan of Care (Signed)
  Problem: Education: Goal: Knowledge of General Education information will improve Description: Including pain rating scale, medication(s)/side effects and non-pharmacologic comfort measures 07/11/2019 1205 by Stevan Born, RN Outcome: Progressing 07/11/2019 1205 by Stevan Born, RN Outcome: Not Met (add Reason)   Problem: Health Behavior/Discharge Planning: Goal: Ability to manage health-related needs will improve 07/11/2019 1205 by Stevan Born, RN Outcome: Progressing 07/11/2019 1205 by Stevan Born, RN Outcome: Not Met (add Reason)   Problem: Clinical Measurements: Goal: Will remain free from infection 07/11/2019 1205 by Stevan Born, RN Outcome: Progressing 07/11/2019 1205 by Stevan Born, RN Outcome: Not Met (add Reason) Goal: Respiratory complications will improve 07/11/2019 1205 by Stevan Born, RN Outcome: Progressing 07/11/2019 1205 by Stevan Born, RN Outcome: Not Met (add Reason) Goal: Cardiovascular complication will be avoided 07/11/2019 1205 by Stevan Born, RN Outcome: Progressing 07/11/2019 1205 by Stevan Born, RN Outcome: Not Met (add Reason)   Problem: Activity: Goal: Risk for activity intolerance will decrease 07/11/2019 1205 by Stevan Born, RN Outcome: Progressing 07/11/2019 1205 by Stevan Born, RN Outcome: Not Met (add Reason)   Problem: Nutrition: Goal: Adequate nutrition will be maintained 07/11/2019 1205 by Stevan Born, RN Outcome: Progressing 07/11/2019 1205 by Stevan Born, RN Outcome: Not Met (add Reason)   Problem: Coping: Goal: Level of anxiety will decrease 07/11/2019 1205 by Stevan Born, RN Outcome: Progressing 07/11/2019 1205 by Stevan Born, RN Outcome: Not Met (add Reason)   Problem: Pain Managment: Goal: General experience of comfort will improve 07/11/2019 1205 by Stevan Born, RN Outcome: Progressing 07/11/2019 1205 by Stevan Born, RN Outcome:  Not Met (add Reason)   Problem: Safety: Goal: Ability to remain free from injury will improve 07/11/2019 1205 by Stevan Born, RN Outcome: Progressing 07/11/2019 1205 by Stevan Born, RN Outcome: Not Met (add Reason)   Problem: Skin Integrity: Goal: Risk for impaired skin integrity will decrease 07/11/2019 1205 by Stevan Born, RN Outcome: Progressing 07/11/2019 1205 by Stevan Born, RN Outcome: Not Met (add Reason)

## 2019-07-11 NOTE — Plan of Care (Signed)
  Problem: Education: Goal: Knowledge of General Education information will improve Description: Including pain rating scale, medication(s)/side effects and non-pharmacologic comfort measures Outcome: Progressing   Problem: Health Behavior/Discharge Planning: Goal: Ability to manage health-related needs will improve Outcome: Progressing   Problem: Clinical Measurements: Goal: Ability to maintain clinical measurements within normal limits will improve Outcome: Progressing   Problem: Activity: Goal: Risk for activity intolerance will decrease Outcome: Progressing   Problem: Pain Managment: Goal: General experience of comfort will improve Outcome: Progressing   Problem: Safety: Goal: Ability to remain free from injury will improve Outcome: Progressing   

## 2019-07-11 NOTE — TOC Progression Note (Signed)
Transition of Care [facility]) - Progression Note    Patient Details  Name: Alicia Schwartz MRN: [id number] Date of Birth: 02-18-1931  Transition [facility]) CM/SW [place],  Phone Number: [phone] 07/11/2019, 12:30 PM  Clinical Narrative:    CSW followed up with Bernadene Bell and due to new waiver authorization is approved; Authorization # [id number]. CSW alerted MD and made note that updated COVID was needed. MD is aware of needed COVID test.CSW has alerted facility about insurance authorization and will be waiting for COVID test to come back.  CSW will continue to follow for discharge planning needs.   Expected Discharge Plan: Skilled Nursing Facility Barriers to Discharge: Continued Medical Work up  Expected Discharge Plan and Services Expected Discharge Plan: [place]                                               Social Determinants of Health (SDOH) Interventions    Readmission Risk Interventions No flowsheet data found.

## 2019-07-12 DIAGNOSIS — I471 Supraventricular tachycardia: Secondary | ICD-10-CM

## 2019-07-12 LAB — BASIC METABOLIC PANEL
Anion gap: 9 (ref 5–15)
BUN: 15 mg/dL (ref 8–23)
CO2: 21 mmol/L — ABNORMAL LOW (ref 22–32)
Calcium: 8.7 mg/dL — ABNORMAL LOW (ref 8.9–10.3)
Chloride: 101 mmol/L (ref 98–111)
Creatinine, Ser: 0.93 mg/dL (ref 0.44–1.00)
GFR calc Af Amer: >60 mL/min (ref >60)
GFR calc non Af Amer: 56 mL/min — ABNORMAL LOW (ref >60)
Glucose, Bld: 94 mg/dL (ref 70–99)
Potassium: 4.5 mmol/L (ref 3.5–5.1)
Sodium: 131 mmol/L — ABNORMAL LOW (ref 135–145)

## 2019-07-12 MED ORDER — CEPHALEXIN 500 MG PO CAPS: 500.0000 mg | ORAL_CAPSULE | Freq: Three times a day (TID) | ORAL | Status: DC

## 2019-07-12 MED ORDER — ACETAMINOPHEN 325 MG PO TABS: 650.0000 mg | ORAL_TABLET | Freq: Four times a day (QID) | ORAL | Status: AC | PRN

## 2019-07-12 MED ORDER — LISINOPRIL 20 MG PO TABS: 20.0000 mg | ORAL_TABLET | Freq: Every day | ORAL | Status: DC

## 2019-07-12 MED ORDER — SENNA 8.6 MG PO TABS: 1.0000 | ORAL_TABLET | Freq: Two times a day (BID) | ORAL | 0 refills | Status: DC | PRN

## 2019-07-12 MED ORDER — HYDROCODONE-ACETAMINOPHEN 5-325 MG PO TABS: 1.0000 | ORAL_TABLET | Freq: Four times a day (QID) | ORAL | 0 refills | Status: DC | PRN

## 2019-07-12 MED ORDER — AMLODIPINE BESYLATE 10 MG PO TABS: 10.0000 mg | ORAL_TABLET | Freq: Every day | ORAL | Status: DC

## 2019-07-12 MED ORDER — IBUPROFEN 200 MG PO TABS: 200.0000 mg | ORAL_TABLET | Freq: Four times a day (QID) | ORAL | 0 refills | Status: DC | PRN

## 2019-07-12 MED ORDER — ONDANSETRON HCL 4 MG PO TABS: 4.0000 mg | ORAL_TABLET | Freq: Four times a day (QID) | ORAL | 0 refills | Status: DC | PRN

## 2019-07-12 NOTE — Discharge Summary (Signed)
Physician Discharge Summary  Alicia Schwartz ZOX:[id number] DOB: [birth year] DOA: 07/03/2019  PCP: Aida Puffer, MD  Admit date: 07/03/2019 Discharge date: 07/12/2019  Admitted From: home  Disposition:  SNF   Recommendations for Outpatient Follow-up:  1. The last dose of Cephalexin will be tonight 2. Please check sodium level in 5-6 days  Discharge Condition:  stable   CODE STATUS:  DNR   Diet recommendation:  Regular diet Consultations:  IR    Discharge Diagnoses:  Principal Problem:   Closed T12 fracture (HCC) Active Problems:   Hyponatremia  E coli UTI   Paroxysmal supraventricular tachycardia (HCC)   Hypertensive urgency      Brief Summary: Alicia Schwartz  is an 85 year old female with history of hypertension, paroxysmal tachycardia, chronic kidney disease stage III who was brought to the emergency department for the second time in 1 week because of worsening back pain. Apparently a week ago patient was bending to do something and then she felt a pop in her mid back and started having severe pain she was seen at the emergency department a CT scan was done which showed T12 endplate fracture and advised to stop brace and follow-up with neurosurgery on June 29, 2019. She returns to the emergency department because of pain has not recently worsened, had Lumbar MRI 12/14 that confirms acute compression fracture T12 with further loss of height.   Hospital Course:  Principal Problem:   Closed T12 fracture  -With severe back pain and limited ability to ambulate -12/16-the patient underwent a vertebroplasty -He and OT eval's, the plan for her is to transition to SNF -Continues to need IV morphine and oxycodone for pain control -Covid test ordered  Active Problems:   Hyponatremia -She has some mild chronic hyponatremia but suspect acute hyponatremia is related to poor oral intake- it has improved after IVF - I am not sure why she is on Aldactone (no h/o CHF)-  I have d/c'd this due to hyponatremia/ dehydration  E. coli UTI -  Keflex given for a total of 7 days- today is the last day    Paroxysmal supraventricular tachycardia - cont Metoprolol    Hypertensive urgency -cont Metoprolol- new medication include Amlodipine and Lisinopril-I have increased her amlodipine - d/c Aldactone as mentioned above  Severely hard of hearing - wears b/l hearing aids     Discharge Exam: Vitals:   07/11/19 2014 07/12/19 0611  BP: (!) 154/45 (!) 176/51  Pulse: (!) 58 (!) 49  Resp:    Temp: (!) 97.3 F (36.3 C) (!) 97.3 F (36.3 C)  SpO2: 100% 100%   Vitals:   07/11/19 1441 07/11/19 1819 07/11/19 2014 07/12/19 0611  BP: (!) 130/42 (!) 142/50 (!) 154/45 (!) 176/51  Pulse: (!) 55 60 (!) 58 (!) 49  Resp: 17 15    Temp: 97.8 F (36.6 C) 97.8 F (36.6 C) (!) 97.3 F (36.3 C) (!) 97.3 F (36.3 C)  TempSrc: Oral Oral Oral Oral  SpO2: 99% 99% 100% 100%  Weight:      Height:        General: Pt is alert, awake, not in acute distress Cardiovascular: RRR, S1/S2 +, no rubs, no gallops Respiratory: CTA bilaterally, no wheezing, no rhonchi Abdominal: Soft, NT, ND, bowel sounds + Extremities: no edema, no cyanosis   Discharge Instructions  Discharge Instructions    Increase activity slowly   Complete by: As directed      Allergies as of 07/12/2019   No Known Allergies  Medication List    STOP taking these medications   alum & mag hydroxide-simeth 200-200-20 MG/5ML suspension Commonly known as: MAALOX/MYLANTA   baclofen 10 MG tablet Commonly known as: LIORESAL   enoxaparin 40 MG/0.4ML injection Commonly known as: Lovenox   sennosides-docusate sodium 8.6-50 MG tablet Commonly known as: SENOKOT-S   spironolactone 25 MG tablet Commonly known as: ALDACTONE     TAKE these medications   acetaminophen 325 MG tablet Commonly known as: TYLENOL Take 2 tablets (650 mg total) by mouth every 6 (six) hours as needed for mild pain (or  Fever >/= 101).   amLODipine 10 MG tablet Commonly known as: NORVASC Take 1 tablet (10 mg total) by mouth daily.   bisacodyl 10 MG suppository Commonly known as: DULCOLAX Place 1 suppository (10 mg total) rectally daily as needed for moderate constipation.   CALCIUM PO Take 1 tablet by mouth daily.   cephALEXin 500 MG capsule Commonly known as: KEFLEX Take 1 capsule (500 mg total) by mouth 3 (three) times daily.   docusate sodium 100 MG capsule Commonly known as: COLACE Take 1 capsule (100 mg total) by mouth 2 (two) times daily.   ferrous sulfate 325 (65 FE) MG tablet Take 1 tablet (325 mg total) by mouth 3 (three) times daily after meals.   folic acid 1 MG tablet Commonly known as: FOLVITE Take 1 mg by mouth daily.   HYDROcodone-acetaminophen 5-325 MG tablet Commonly known as: Norco Take 1-2 tablets by mouth every 6 (six) hours as needed for moderate pain (1 tab for moderate pain and 2 tabs for severe pain PRN). MAXIMUM TOTAL ACETAMINOPHEN DOSE IS 4000 MG PER DAY What changed: reasons to take this   ibuprofen 200 MG tablet Commonly known as: ADVIL Take 1 tablet (200 mg total) by mouth every 6 (six) hours as needed for mild pain. What changed: reasons to take this   lisinopril 20 MG tablet Commonly known as: ZESTRIL Take 1 tablet (20 mg total) by mouth daily.   metoprolol succinate 50 MG 24 hr tablet Commonly known as: TOPROL-XL Take 50 mg by mouth every evening.   MULTIVITAMIN GUMMIES ADULT PO Take 1 capsule by mouth daily.   ondansetron 4 MG tablet Commonly known as: ZOFRAN Take 1 tablet (4 mg total) by mouth every 6 (six) hours as needed for nausea.   polyethylene glycol 17 g packet Commonly known as: MIRALAX / GLYCOLAX Take 17 g by mouth daily.   senna 8.6 MG Tabs tablet Commonly known as: SENOKOT Take 1 tablet (8.6 mg total) by mouth 2 (two) times daily as needed for mild constipation.       No Known  Allergies   Procedures/Studies: Vertebroplasty   CT Lumbar Spine Wo Contrast  Result Date: 06/28/2019 CLINICAL DATA:  Back pain. Lifting injury. Patient felt sudden onset of pain. EXAM: CT LUMBAR SPINE WITHOUT CONTRAST TECHNIQUE: Multidetector CT imaging of the lumbar spine was performed without intravenous contrast administration. Multiplanar CT image reconstructions were also generated. COMPARISON:  None. FINDINGS: Segmentation: 5 lumbar type vertebral bodies assumed. Alignment: Thoracolumbar kyphosis. Increased lumbar lordosis. 3 mm degenerative anterolisthesis L4-5. Vertebrae: Acute appearing superior endplate fracture at T12 with loss of height of 1/3. No retropulsed bone. Old appearing superior endplate fracture of L1, inferior endplate fracture of L2 and generalized vertebral body fracture at L5. Those all look old and healed. Paraspinal and other soft tissues: Negative other than aortic atherosclerosis. Disc levels: No significant disc space pathology at L3-4 or above. L4-5: Facet arthropathy  with 3 mm of anterolisthesis. Endplate osteophytes and bulging of the disc. Moderate multifactorial stenosis. L5-S1: Disc bulge more prominent towards the left. Bilateral facet degeneration. Lateral recess and foraminal encroachment. IMPRESSION: Probable superior endplate fracture at T12 with loss of height of 1/3. No retropulsion. Minimal posterior bowing of the posterosuperior margin. Old healed fractures at L1, L2 and L5. Degenerative disease with moderate multifactorial stenosis at L4-5. Electronically Signed   By: Paulina Fusi M.D.   On: 06/28/2019 10:43   MR LUMBAR SPINE WO CONTRAST  Result Date: 07/06/2019 CLINICAL DATA:  Lumbar region back pain EXAM: MRI LUMBAR SPINE WITHOUT CONTRAST TECHNIQUE: Multiplanar, multisequence MR imaging of the lumbar spine was performed. No intravenous contrast was administered. COMPARISON:  CT 06/28/2019 FINDINGS: Segmentation: 5 lumbar type vertebral bodies as numbered  previously. Alignment: Increased kyphotic curvature at the thoracolumbar junction region due to fractures. 3 mm degenerative anterolisthesis L4-5. 1 mm degenerative anterolisthesis L5-S1. Vertebrae: Progression of the acute fracture at T12 compared to the prior CT. Loss of height now of 60%. Posterior bowing of the posterior margin of the vertebral body by 4 mm. Persistent marrow space edema. Old healed superior endplate fracture at L1, inferior endplate fracture at L2 and generalized vertebral body loss of height at L5 appear unchanged, without any edema. No evidence of sacral fracture. Conus medullaris and cauda equina: Conus extends to the L1-2 level. Conus and cauda equina appear normal. Paraspinal and other soft tissues: Chololithiasis. Disc levels: T11-12: No traumatic disc herniation. At T12, there is canal stenosis because of 4 mm posterior bowing of the superior endplate of T12. This effaces the subarachnoid space and indents the cord slightly. The T12-L1 disc does not show bulge or herniation. L1-2: Disc degeneration with shallow protrusion and slight caudal migration in the midline. Facet and ligamentous prominence. Spinal stenosis at this level with effacement of the subarachnoid space and crowding of the nerve roots of the cauda equina. L2-3: Bulging of the disc. Mild stenosis of both lateral recesses but no definite neural compression. L3-4: Interspace. L4-5: Chronic facet arthropathy with 3 mm of anterolisthesis. Bulging of the disc. Stenosis of both lateral recesses and proximal foramina. Some potential for neural compression at this level. L5-S1: Facet osteoarthritis with 1 mm of anterolisthesis. No disc herniation. No compressive stenosis. Edema between the spinous processes of L2-3 and L3-4 indicate abutment and could contribute to low back pain. IMPRESSION: Progression of the fracture at T12 since the CT study of 8 days ago. Loss of height has worsened from 30% to about 60%. Edema associated with  the recent/active nature of the process. This looks like a benign fracture. Posterior bowing of the posterosuperior margin of the vertebral body with narrowing of the canal. Slight indentation of the cord without frank cord compression. Old healed superior endplate fracture at L1, inferior endplate fracture at L2 and generalized vertebral body height loss at L5. No residual edema at those levels. Multifactorial stenosis at L1-2 with effacement of the subarachnoid space and crowding of the nerve roots of the cauda equina. Bilateral lateral recess stenosis at L4-5 that could be symptomatic. Facet osteoarthritis at L4-5 and L5-S1 could also contribute to low back pain. Chololithiasis. Abutment changes between the spinous processes at L2-3 and L3-4 could contribute to back pain. Electronically Signed   By: Paulina Fusi M.D.   On: 07/06/2019 13:23   IR VERTEBROPLASTY CERV/THOR BX INC UNI/BIL INC/INJECT/IMAGING  Result Date: 07/08/2019 CLINICAL DATA:  Severe low back pain secondary to compression fracture at  T12. INDICATION: Severe low back pain secondary to compression fracture at T12. EXAM: VERTEBROPLASTY AT T12 WITH BIOPSY MEDICATIONS: As antibiotic prophylaxis, Ancef 1 g IV was ordered pre-procedure and administered intravenously within 1 hour of incision. ANESTHESIA/SEDATION: Moderate (conscious) sedation was employed during this procedure. A total of Versed 1 mg and Fentanyl 25 mcg was administered intravenously. Moderate Sedation Time: 21 minutes. The patient's level of consciousness and vital signs were monitored continuously by radiology nursing throughout the procedure under my direct supervision. FLUOROSCOPY TIME:  Fluoroscopy Time: 8 minutes 36 seconds (339 mGy) COMPLICATIONS: None immediate. TECHNIQUE: Informed written consent was obtained from the patient after a thorough discussion of the procedural risks, benefits and alternatives. All questions were addressed. Maximal Sterile Barrier Technique was  utilized including caps, mask, sterile gowns, sterile gloves, sterile drape, hand hygiene and skin antiseptic. A timeout was performed prior to the initiation of the procedure. PROCEDURE: The patient was placed prone on the fluoroscopic table. Nasal oxygen was administered. Physiologic monitoring was performed throughout the duration of the procedure. The skin overlying the thoracolumbar region was prepped and draped in the usual sterile fashion. The T12 vertebral body was identified and the right pedicle was infiltrated with 0.25% Bupivacaine. This was then followed by the advancement of a 13-gauge Cook needle through the right pedicle into the posterior one-third at T12. A 16 gauge core biopsy was then advanced to anterior 1/3 at T12. Using a 20 mL syringe, a core biopsy was obtained and sent for pathologic analysis as per request of the referring MD. The 13 gauge Cook spinal needle was then advanced more distally into the anterior 1/3. A gentle contrast injection demonstrated trabecular pattern of contrast enhancement. Methylmethacrylate mixture was reconstituted with tobramycin in the Stryker delivery device system. Using biplane intermittent fluoroscopy, methylmethacrylate mixture was then injected with excellent filling on the AP and lateral projections. No extravasation was noted into the disk spaces or posteriorly into the spinal canal. No epidural venous contamination was seen. The needle was then removed. Hemostasis was achieved at the skin entry site. There were no acute complications. Patient tolerated the procedure well. The patient was then returned to the floor in stable condition. IMPRESSION: 1. Status post vertebral body augmentation for painful compression fracture at T12 using vertebroplasty technique. 2. Proceeded by core biopsy at T12 as per request of referring MD. Electronically Signed   By: Julieanne Cotton M.D.   On: 07/08/2019 10:42     The results of significant diagnostics from this  hospitalization (including imaging, microbiology, ancillary and laboratory) are listed below for reference.     Microbiology: Recent Results (from the past 240 hour(s))  SARS CORONAVIRUS 2 (TAT 6-24 HRS) Nasopharyngeal Nasopharyngeal Swab     Status: None   Collection Time: 07/03/19 11:01 PM   Specimen: Nasopharyngeal Swab  Result Value Ref Range Status   SARS Coronavirus 2 NEGATIVE NEGATIVE Final    Comment: (NOTE) SARS-CoV-2 target nucleic acids are NOT DETECTED. The SARS-CoV-2 RNA is generally detectable in upper and lower respiratory specimens during the acute phase of infection. Negative results do not preclude SARS-CoV-2 infection, do not rule out co-infections with other pathogens, and should not be used as the sole basis for treatment or other patient management decisions. Negative results must be combined with clinical observations, patient history, and epidemiological information. The expected result is Negative. Fact Sheet for Patients: HairSlick.no Fact Sheet for Healthcare Providers: quierodirigir.com This test is not yet approved or cleared by the [place] FDA and  has been authorized for detection and/or diagnosis of SARS-CoV-2 by FDA under an Emergency Use Authorization (EUA). This EUA will remain  in effect (meaning this test can be used) for the duration of the COVID-19 declaration under Section 56 4(b)(1) of the Act, 21 U.S.C. section 360bbb-3(b)(1), unless the authorization is terminated or revoked sooner. Performed at [facility], [address] [place], [place] [address]   Urine culture     Status: Abnormal   Collection Time: 07/04/19  3:50 AM   Specimen: Urine, Random  Result Value Ref Range Status   Specimen Description URINE, RANDOM  Final   Special Requests   Final    NONE Performed at [facility], [address] [place], [place] [address]    Culture >=100,000 COLONIES/mL  ESCHERICHIA COLI (A)  Final   Report Status 07/06/2019 FINAL  Final   Organism ID, Bacteria ESCHERICHIA COLI (A)  Final      Susceptibility   Escherichia coli - MIC*    AMPICILLIN <=2 SENSITIVE Sensitive     CEFAZOLIN <=4 SENSITIVE Sensitive     CEFTRIAXONE <=1 SENSITIVE Sensitive     CIPROFLOXACIN <=0.25 SENSITIVE Sensitive     GENTAMICIN <=1 SENSITIVE Sensitive     IMIPENEM <=0.25 SENSITIVE Sensitive     NITROFURANTOIN <=16 SENSITIVE Sensitive     TRIMETH/SULFA <=20 SENSITIVE Sensitive     AMPICILLIN/SULBACTAM <=2 SENSITIVE Sensitive     PIP/TAZO <=4 SENSITIVE Sensitive     * >=100,000 COLONIES/mL ESCHERICHIA COLI  SARS CORONAVIRUS 2 (TAT 6-24 HRS) Nasopharyngeal Nasopharyngeal Swab     Status: None   Collection Time: 07/11/19 12:34 PM   Specimen: Nasopharyngeal Swab  Result Value Ref Range Status   SARS Coronavirus 2 NEGATIVE NEGATIVE Final    Comment: (NOTE) SARS-CoV-2 target nucleic acids are NOT DETECTED. The SARS-CoV-2 RNA is generally detectable in upper and lower respiratory specimens during the acute phase of infection. Negative results do not preclude SARS-CoV-2 infection, do not rule out co-infections with other pathogens, and should not be used as the sole basis for treatment or other patient management decisions. Negative results must be combined with clinical observations, patient history, and epidemiological information. The expected result is Negative. Fact Sheet for Patients: HairSlick.nohttps://www.fda.gov/media/138098/download Fact Sheet for Healthcare Providers: quierodirigir.comhttps://www.fda.gov/media/138095/download This test is not yet approved or cleared by the [place] FDA and  has been authorized for detection and/or diagnosis of SARS-CoV-2 by FDA under an Emergency Use Authorization (EUA). This EUA will remain  in effect (meaning this test can be used) for the duration of the COVID-19 declaration under Section 56 4(b)(1) of the Act, 21 U.S.C. section 360bbb-3(b)(1),  unless the authorization is terminated or revoked sooner. Performed at [facility], [address] [place], [place] [address]      Labs: BNP (last 3 results) No results for input(s): BNP in the last 8760 hours. Basic Metabolic Panel: Recent Labs  Lab 07/08/19 0618 07/09/19 0348 07/10/19 1218 07/11/19 0313 07/11/19 1831 07/12/19 0435  NA 130* 131* 127*  --  127* 131*  K 4.9 4.8 4.3  --  4.9 4.5  CL 99 100 95*  --  99 101  CO2 23 21* 22  --  19* 21*  GLUCOSE 109* 101* 87  --  100* 94  BUN 18 22 16   --  20 15  CREATININE 0.99 1.05* 1.05* 1.10* 0.98 0.93  CALCIUM 8.9 8.7* 9.0  --  8.7* 8.7*  MG 2.1  --   --   --   --   --  Liver Function Tests: No results for input(s): AST, ALT, ALKPHOS, BILITOT, PROT, ALBUMIN in the last 168 hours. No results for input(s): LIPASE, AMYLASE in the last 168 hours. No results for input(s): AMMONIA in the last 168 hours. CBC: Recent Labs  Lab 07/08/19 0618 07/09/19 0348  WBC 7.5 9.5  HGB 11.0* 10.7*  HCT 32.2* 30.8*  MCV 102.2* 101.7*  PLT 364 366   Cardiac Enzymes: No results for input(s): CKTOTAL, CKMB, CKMBINDEX, TROPONINI in the last 168 hours. BNP: Invalid input(s): POCBNP CBG: No results for input(s): GLUCAP in the last 168 hours. D-Dimer No results for input(s): DDIMER in the last 72 hours. Hgb A1c No results for input(s): HGBA1C in the last 72 hours. Lipid Profile No results for input(s): CHOL, HDL, LDLCALC, TRIG, CHOLHDL, LDLDIRECT in the last 72 hours. Thyroid function studies No results for input(s): TSH, T4TOTAL, T3FREE, THYROIDAB in the last 72 hours.  Invalid input(s): FREET3 Anemia work up No results for input(s): VITAMINB12, FOLATE, FERRITIN, TIBC, IRON, RETICCTPCT in the last 72 hours. Urinalysis    Component Value Date/Time   COLORURINE STRAW (A) 07/08/2019 0350   APPEARANCEUR CLEAR 07/08/2019 0350   LABSPEC 1.009 07/08/2019 0350   PHURINE 7.0 07/08/2019 0350   GLUCOSEU NEGATIVE 07/08/2019 0350    HGBUR NEGATIVE 07/08/2019 0350   BILIRUBINUR NEGATIVE 07/08/2019 0350   KETONESUR NEGATIVE 07/08/2019 0350   PROTEINUR NEGATIVE 07/08/2019 0350   NITRITE NEGATIVE 07/08/2019 0350   LEUKOCYTESUR NEGATIVE 07/08/2019 0350   Sepsis Labs Invalid input(s): PROCALCITONIN,  WBC,  LACTICIDVEN Microbiology Recent Results (from the past 240 hour(s))  SARS CORONAVIRUS 2 (TAT 6-24 HRS) Nasopharyngeal Nasopharyngeal Swab     Status: None   Collection Time: 07/03/19 11:01 PM   Specimen: Nasopharyngeal Swab  Result Value Ref Range Status   SARS Coronavirus 2 NEGATIVE NEGATIVE Final    Comment: (NOTE) SARS-CoV-2 target nucleic acids are NOT DETECTED. The SARS-CoV-2 RNA is generally detectable in upper and lower respiratory specimens during the acute phase of infection. Negative results do not preclude SARS-CoV-2 infection, do not rule out co-infections with other pathogens, and should not be used as the sole basis for treatment or other patient management decisions. Negative results must be combined with clinical observations, patient history, and epidemiological information. The expected result is Negative. Fact Sheet for Patients: HairSlick.no Fact Sheet for Healthcare Providers: quierodirigir.com This test is not yet approved or cleared by the [place] FDA and  has been authorized for detection and/or diagnosis of SARS-CoV-2 by FDA under an Emergency Use Authorization (EUA). This EUA will remain  in effect (meaning this test can be used) for the duration of the COVID-19 declaration under Section 56 4(b)(1) of the Act, 21 U.S.C. section 360bbb-3(b)(1), unless the authorization is terminated or revoked sooner. Performed at [facility], [address], [place], [place] [address]   Urine culture     Status: Abnormal   Collection Time: 07/04/19  3:50 AM   Specimen: Urine, Random  Result Value Ref Range Status   Specimen  Description URINE, RANDOM  Final   Special Requests   Final    NONE Performed at [facility], [address], [place], [place] [address]    Culture >=100,000 COLONIES/mL ESCHERICHIA COLI (A)  Final   Report Status 07/06/2019 FINAL  Final   Organism ID, Bacteria ESCHERICHIA COLI (A)  Final      Susceptibility   Escherichia coli - MIC*    AMPICILLIN <=2 SENSITIVE Sensitive  CEFAZOLIN <=4 SENSITIVE Sensitive     CEFTRIAXONE <=1 SENSITIVE Sensitive     CIPROFLOXACIN <=0.25 SENSITIVE Sensitive     GENTAMICIN <=1 SENSITIVE Sensitive     IMIPENEM <=0.25 SENSITIVE Sensitive     NITROFURANTOIN <=16 SENSITIVE Sensitive     TRIMETH/SULFA <=20 SENSITIVE Sensitive     AMPICILLIN/SULBACTAM <=2 SENSITIVE Sensitive     PIP/TAZO <=4 SENSITIVE Sensitive     * >=100,000 COLONIES/mL ESCHERICHIA COLI  SARS CORONAVIRUS 2 (TAT 6-24 HRS) Nasopharyngeal Nasopharyngeal Swab     Status: None   Collection Time: 07/11/19 12:34 PM   Specimen: Nasopharyngeal Swab  Result Value Ref Range Status   SARS Coronavirus 2 NEGATIVE NEGATIVE Final    Comment: (NOTE) SARS-CoV-2 target nucleic acids are NOT DETECTED. The SARS-CoV-2 RNA is generally detectable in upper and lower respiratory specimens during the acute phase of infection. Negative results do not preclude SARS-CoV-2 infection, do not rule out co-infections with other pathogens, and should not be used as the sole basis for treatment or other patient management decisions. Negative results must be combined with clinical observations, patient history, and epidemiological information. The expected result is Negative. Fact Sheet for Patients: SugarRoll.be Fact Sheet for Healthcare Providers: https://www.woods-mathews.com/ This test is not yet approved or cleared by the [place] FDA and  has been authorized for detection and/or diagnosis of SARS-CoV-2 by FDA under an Emergency Use Authorization (EUA). This  EUA will remain  in effect (meaning this test can be used) for the duration of the COVID-19 declaration under Section 56 4(b)(1) of the Act, 21 U.S.C. section 360bbb-3(b)(1), unless the authorization is terminated or revoked sooner. Performed at [facility], [place] [address], [place], [place] [address]      Time coordinating discharge in minutes: 20  SIGNED:   Debbe Odea, MD  [facility] 07/12/2019, 8:06 AM Pager   If 7PM-7AM, please contact night-coverage [email or web address] Password [email or web address]

## 2019-07-12 NOTE — Plan of Care (Addendum)
Pt discharging to [facility], report given to RN at 1225. Discharge instructions explained to pt and pt verbalized understanding. Packed all personal belongings. No further questions or concerns voiced. Awaiting PTAR, scheduled to arrive at 2pm.   Problem: Education: Goal: Knowledge of General Education information will improve Description: Including pain rating scale, medication(s)/side effects and non-pharmacologic comfort measures Outcome: Completed/Met   Problem: Health Behavior/Discharge Planning: Goal: Ability to manage health-related needs will improve Outcome: Completed/Met   Problem: Clinical Measurements: Goal: Ability to maintain clinical measurements within normal limits will improve Outcome: Completed/Met Goal: Will remain free from infection Outcome: Completed/Met Goal: Diagnostic test results will improve Outcome: Completed/Met Goal: Respiratory complications will improve Outcome: Completed/Met Goal: Cardiovascular complication will be avoided Outcome: Completed/Met   Problem: Activity: Goal: Risk for activity intolerance will decrease Outcome: Completed/Met   Problem: Nutrition: Goal: Adequate nutrition will be maintained Outcome: Completed/Met   Problem: Coping: Goal: Level of anxiety will decrease Outcome: Completed/Met   Problem: Elimination: Goal: Will not experience complications related to bowel motility Outcome: Completed/Met Goal: Will not experience complications related to urinary retention Outcome: Completed/Met   Problem: Pain Managment: Goal: General experience of comfort will improve Outcome: Completed/Met   Problem: Safety: Goal: Ability to remain free from injury will improve Outcome: Completed/Met   Problem: Skin Integrity: Goal: Risk for impaired skin integrity will decrease Outcome: Completed/Met

## 2019-07-12 NOTE — TOC Transition Note (Signed)
Transition of Care [facility]) - CM/SW Discharge Note   Patient Details  Name: Zanyla Klebba MRN: [id number] Date of Birth: 12-09-1930  Transition of Care [facility]) CM/SW Contact:  Bary Castilla, LCSW Phone Number:[phone] 07/12/2019, 11:41 AM   Clinical Narrative:    Patient will DC to:?[facility] date:?07/12/19 Family notified:?[name] Transport JY:NWGN   Per [place] patient ready for DC to [facility]. RN, patient, patient's family, and facility notified of DC. Discharge Summary sent to facility. RN given number for report  [phone] room 115p. DC packet on chart. Ambulance transport requested for patient.   CSW signing off.   [place], [place] [phone]    Final next level of care: Skilled Nursing Facility Barriers to Discharge: No Barriers Identified   Patient Goals and CMS Choice        Discharge Placement              Patient chooses bed at: [facility] Patient to be transferred to facility by: [place] Name of family member notified: [name] Patient and family notified of of transfer: 07/12/19  Discharge Plan and Services                                     Social Determinants of Health (SDOH) Interventions     Readmission Risk Interventions No flowsheet data found.

## 2020-12-14 ENCOUNTER — Other Ambulatory Visit: Payer: Self-pay | Admitting: Family Medicine

## 2020-12-14 ENCOUNTER — Other Ambulatory Visit: Payer: Self-pay

## 2020-12-14 ENCOUNTER — Ambulatory Visit
Admission: RE | Admit: 2020-12-14 | Discharge: 2020-12-14 | Disposition: A | Discharge status: Home / Self Care | Payer: Medicare Other | Source: Ambulatory Visit | Attending: Family Medicine | Admitting: Family Medicine

## 2020-12-14 DIAGNOSIS — R52 Pain, unspecified: Secondary | ICD-10-CM

## 2021-08-23 DIAGNOSIS — R7989 Other specified abnormal findings of blood chemistry: Secondary | ICD-10-CM | POA: Diagnosis not present

## 2021-08-23 DIAGNOSIS — I10 Essential (primary) hypertension: Secondary | ICD-10-CM | POA: Diagnosis not present

## 2021-08-23 DIAGNOSIS — D649 Anemia, unspecified: Secondary | ICD-10-CM | POA: Diagnosis not present

## 2021-08-23 DIAGNOSIS — E7800 Pure hypercholesterolemia, unspecified: Secondary | ICD-10-CM | POA: Diagnosis not present

## 2021-08-23 DIAGNOSIS — N1832 Chronic kidney disease, stage 3b: Secondary | ICD-10-CM | POA: Diagnosis not present

## 2021-08-30 DIAGNOSIS — E7800 Pure hypercholesterolemia, unspecified: Secondary | ICD-10-CM | POA: Diagnosis not present

## 2021-08-30 DIAGNOSIS — E875 Hyperkalemia: Secondary | ICD-10-CM | POA: Diagnosis not present

## 2021-08-30 DIAGNOSIS — N1832 Chronic kidney disease, stage 3b: Secondary | ICD-10-CM | POA: Diagnosis not present

## 2021-08-30 DIAGNOSIS — I10 Essential (primary) hypertension: Secondary | ICD-10-CM | POA: Diagnosis not present

## 2021-08-30 DIAGNOSIS — R5381 Other malaise: Secondary | ICD-10-CM | POA: Diagnosis not present

## 2021-08-30 DIAGNOSIS — D649 Anemia, unspecified: Secondary | ICD-10-CM | POA: Diagnosis not present

## 2021-08-30 DIAGNOSIS — Z9989 Dependence on other enabling machines and devices: Secondary | ICD-10-CM | POA: Diagnosis not present

## 2021-08-30 DIAGNOSIS — R7989 Other specified abnormal findings of blood chemistry: Secondary | ICD-10-CM | POA: Diagnosis not present

## 2021-09-05 ENCOUNTER — Telehealth: Payer: Self-pay

## 2021-09-05 DIAGNOSIS — Z515 Encounter for palliative care: Secondary | ICD-10-CM

## 2021-09-05 NOTE — Telephone Encounter (Signed)
(  4:17 pm) SW scheduled initial visit with patient for palliative care visit. Visit scheduled for 2/20 at 12 pm with Dr. Renato Gails via telehealth.

## 2021-09-11 ENCOUNTER — Other Ambulatory Visit: Payer: Self-pay

## 2021-09-11 ENCOUNTER — Encounter: Payer: Self-pay | Admitting: Internal Medicine

## 2021-09-11 ENCOUNTER — Other Ambulatory Visit: Payer: BC Managed Care – PPO | Admitting: Internal Medicine

## 2021-09-11 DIAGNOSIS — Z515 Encounter for palliative care: Secondary | ICD-10-CM

## 2021-09-11 DIAGNOSIS — S22088S Other fracture of T11-T12 vertebra, sequela: Secondary | ICD-10-CM

## 2021-09-11 DIAGNOSIS — R54 Age-related physical debility: Secondary | ICD-10-CM

## 2021-09-11 NOTE — Progress Notes (Signed)
[facility] Consult Note Telephone: [phone]  Fax: [phone]   Date of encounter: 09/11/21 8:08 AM PATIENT NAME: Alicia Schwartz [address] [place] [address]   [phone] (home)  DOB: 1930-11-05 MRN: [id number] PRIMARY CARE PROVIDER:    Deon Pilling, NP at [place] PROVIDER:   Deon Pilling, NP at [place]:    Contact Information     Name Relation Home Work [place]   Alicia Schwartz Niece [phone]  [phone]   Alicia Schwartz   [phone]      Due to the COVID-19 crisis, this visit was done via telemedicine from my office and it was initiated and consent by this patient and or family.  I connected with  Alicia Schwartz OR PROXY on 09/11/21 by a telemedicine application and verified that I am speaking with the correct person using two identifiers.   I discussed the limitations of evaluation and management by telemedicine. The patient expressed understanding and agreed to proceed.  Palliative Care was asked to follow this patient by consultation request of  Alicia Pilling, NP to address advance care planning and complex medical decision making. This is the initial visit.                                    ASSESSMENT AND PLAN / RECOMMENDATIONS:   Advance Care Planning/Goals of Care: Goals include to maximize quality of life and symptom management. Patient/health care surrogate gave his/her permission to discuss.Our advance care planning conversation included a discussion about:    The value and importance of advance care planning  Experiences with loved ones who have been seriously ill or have died  Exploration of personal, cultural or spiritual beliefs that might influence medical decisions  Exploration of goals of care in the event of a sudden injury or illness  Identification  of a healthcare agent  Review and updating or  creation of an  advance directive document . Decision not to resuscitate or to de-escalate disease focused treatments due to poor prognosis. CODE STATUS: DNR 12/20 Has son who lives in [place] but he does is not very involved.  Does not have living will or HCPOA.   Alicia Schwartz also takes her to bank, grocery, doctor's appts--she's tired.  Symptom Management/Plan: 1. Other closed fracture of twelfth thoracic vertebra, sequela -with some chronic back pain and decreased mobility -continue current regimen for pain, reassess in person in the future   2. Age-related physical debility -challenge is that her niece is her primary caregiver and also cares for her own mother--she's overwhelmed; social work is going to visit with them 2/22 and determine some next steps from the caregiver support perspective  3. Palliative care encounter -reviewed status of advance directives and current needs, pt does have a DNR, would benefit from MOST form in the future especially due to fact that no LW/HCPOA previously completed  Follow up Palliative Care Visit: Palliative care will continue to follow for complex medical decision making, advance care planning, and clarification of goals. Return 8 weeks or prn.  Social work f/u in 2 days planned already.   This visit was coded based on medical decision making (MDM).  PPS: weak 50%  HOSPICE ELIGIBILITY/DIAGNOSIS: TBD  Chief Complaint: palliative care consult  HISTORY OF PRESENT ILLNESS:  Alicia Schwartz is a 88 y.o. year old female  with debility, CKD3b, htn, unsteady gait (uses cane), hyperlipidemia, cholelithiasis, hyperglycemia, cerumen impaction, osteoporosis, right femur fx 01/2015 s/p IM nail, T12 compression fx 12/20 s/p vertebroplasty, hyponatremia, hyperkalemia and elevated TSH on labs at PCP office seen for new palliative care consult.  She lives alone, does not drive.  Her niece helps take care of her.  They tried to do the life alert.  She is unable to hear  despite her hearing aids.  Testing reveals they are working.  She has a rollator walker also to get around with.  She has a regular walker and commode, too.  She does get up to use the bathroom.  She's got bad spinal curvature.  Alicia Schwartz helps with her laundry.  Alicia Schwartz takes care of her mother and had trouble with a cat being sick last week.  She takes bird baths, no bathtub.  She washes her hair.  Dresses herself.  Uses a pad for urinary incontinence.  If she eats cheese, she eats cheese.  Might take something, but usually waits it out.  Worries and then stays awake.  Worries about silly little things.    She reports pain, but doesn't like to take anything.  Has ibuprofen.  Will only take one at a time.    She takes her own meds and calls in refills.  Only two real meds and supplements.    Says she puts aspercreme on her back and does her exercise.    History obtained from review of EMR, discussion with primary team, and interview with family, facility staff/caregiver and/or Alicia Schwartz.   I reviewed available labs, medications, imaging, studies and related documents from the EMR.  Records reviewed and summarized above.   ROS  General: NAD EYES: denies vision changes ENMT: denies dysphagia Cardiovascular: denies chest pain, denies DOE Pulmonary: denies cough, denies increased SOB Abdomen: endorses good appetite, denies constipation, endorses continence of bowel GU: denies dysuria, endorses incontinence of urine MSK:  has increased weakness,  h/o falls reported, has kyphosis Skin: denies rashes or wounds Neurological: has back pain, denies insomnia Psych: Endorses positive mood Heme/lymph/immuno: denies bruises, abnormal bleeding  Physical Exam: Current and past weights: 96.8lbs on 08/30/21 BMI 21.32 at 86.5in Telephone visit only due to pt and niece capabilities  CURRENT PROBLEM LIST:  Patient Active Problem List   Diagnosis Date Noted   Hyponatremia 07/11/2019   Closed T12 fracture  (HCC) 07/04/2019   Hypertensive urgency 07/04/2019   Back pain 07/04/2019   Pressure injury of skin 07/04/2019   Postoperative anemia due to acute blood loss 02/14/2015   Paroxysmal supraventricular tachycardia (HCC) 02/12/2015   Fracture, subtrochanteric, right femur, closed (HCC) 02/11/2015   Fall due to stumbling 02/11/2015   Anxiety state 09/19/2006   HYPERTENSION, BENIGN SYSTEMIC 09/19/2006   PAST MEDICAL HISTORY:  Active Ambulatory Problems    Diagnosis Date Noted   Anxiety state 09/19/2006   HYPERTENSION, BENIGN SYSTEMIC 09/19/2006   Fracture, subtrochanteric, right femur, closed (HCC) 02/11/2015   Fall due to stumbling 02/11/2015   Paroxysmal supraventricular tachycardia (HCC) 02/12/2015   Postoperative anemia due to acute blood loss 02/14/2015   Closed T12 fracture (HCC) 07/04/2019   Hypertensive urgency 07/04/2019   Back pain 07/04/2019   Pressure injury of skin 07/04/2019   Hyponatremia 07/11/2019   Resolved Ambulatory Problems    Diagnosis Date Noted   Mid back pain 07/04/2019   Past Medical History:  Diagnosis Date   Hypertension    SOCIAL HX:  Social History   Tobacco  Use   Smoking status: Never   Smokeless tobacco: Never  Substance Use Topics   Alcohol use: No   FAMILY HX:  Family History  Problem Relation Age of Onset   Alzheimer's disease Mother    Prostate cancer Father       ALLERGIES: No Known Allergies    PERTINENT MEDICATIONS:  Outpatient Encounter Medications as of 09/11/2021  Medication Sig   acetaminophen (TYLENOL) 325 MG tablet Take 2 tablets (650 mg total) by mouth every 6 (six) hours as needed for mild pain (or Fever >/= 101).   amLODipine (NORVASC) 10 MG tablet Take 1 tablet (10 mg total) by mouth daily.   bisacodyl (DULCOLAX) 10 MG suppository Place 1 suppository (10 mg total) rectally daily as needed for moderate constipation. (Patient not taking: Reported on 06/28/2019)   CALCIUM PO Take 1 tablet by mouth daily.   cephALEXin  (KEFLEX) 500 MG capsule Take 1 capsule (500 mg total) by mouth 3 (three) times daily.   docusate sodium (COLACE) 100 MG capsule Take 1 capsule (100 mg total) by mouth 2 (two) times daily.   ferrous sulfate 325 (65 FE) MG tablet Take 1 tablet (325 mg total) by mouth 3 (three) times daily after meals. (Patient not taking: Reported on [id number])   folic acid (FOLVITE) 1 MG tablet Take 1 mg by mouth daily.   HYDROcodone-acetaminophen (NORCO) 5-325 MG tablet Take 1-2 tablets by mouth every 6 (six) hours as needed for moderate pain (1 tab for moderate pain and 2 tabs for severe pain PRN). MAXIMUM TOTAL ACETAMINOPHEN DOSE IS 4000 MG PER DAY   ibuprofen (ADVIL) 200 MG tablet Take 1 tablet (200 mg total) by mouth every 6 (six) hours as needed for mild pain.   lisinopril (ZESTRIL) 20 MG tablet Take 1 tablet (20 mg total) by mouth daily.   metoprolol succinate (TOPROL-XL) 50 MG 24 hr tablet Take 50 mg by mouth every evening.    Multiple Vitamins-Minerals (MULTIVITAMIN GUMMIES ADULT PO) Take 1 capsule by mouth daily.   ondansetron (ZOFRAN) 4 MG tablet Take 1 tablet (4 mg total) by mouth every 6 (six) hours as needed for nausea.   polyethylene glycol (MIRALAX / GLYCOLAX) packet Take 17 g by mouth daily. (Patient not taking: Reported on 06/28/2019)   senna (SENOKOT) 8.6 MG TABS tablet Take 1 tablet (8.6 mg total) by mouth 2 (two) times daily as needed for mild constipation.   No facility-administered encounter medications on file as of 09/11/2021.   Thank you for the opportunity to participate in the care of Ms. Hinely.  The palliative care team will continue to follow. Please call our office at [phone] if we can be of additional assistance.   Hollace Kinnier, DO   COVID-19 PATIENT SCREENING TOOL Asked and negative response unless otherwise noted:  Have you had symptoms of covid, tested positive or been in contact with someone with symptoms/positive test in the past 5-10 days? no

## 2021-09-13 ENCOUNTER — Other Ambulatory Visit: Payer: Self-pay

## 2021-09-13 ENCOUNTER — Other Ambulatory Visit: Payer: BC Managed Care – PPO

## 2021-09-13 DIAGNOSIS — Z515 Encounter for palliative care: Secondary | ICD-10-CM

## 2021-09-13 NOTE — Progress Notes (Signed)
COMMUNITY PALLIATIVE CARE SW NOTE  PATIENT NAME: Alicia Schwartz DOB: March 24, 1931 MRN: [id number]  PRIMARY CARE PROVIDER: Aida Puffer, [place]  RESPONSIBLE PARTY:  Acct ID - Guarantor Home Phone Work Phone Relationship Acct Type  [id number] - Alicia Schwartz,NE* [phone]  Self P/F     [address], [place], [place] [address]     PLAN OF CARE and INTERVENTIONS:             GOALS OF CARE/ ADVANCE CARE PLANNING:  Goal is for patient to remain in her home as long as possible.  SOCIAL/EMOTIONAL/SPIRITUAL ASSESSMENT/ INTERVENTIONS: SW completed a face-to-face visit with patient at her home. She was present with her sister and niece/PCG-Alicia Schwartz. Patient was sitting in a chair. She greeted SW warmly. SW introduced herself and her role in patient's care. SW assessed any needs or concerns patient had regarding her care or safety. Patient lives alone. She utilizes a rollator walker to ambulate. She takes her medication daily. She completes ADL's independently. Her niece will pick her up most days and she will spend the day with her at home. Her niece manages patient doctor's visits and some finances. Patient has a son that lives out of state, but is minimally involved with her care. Education was provided to patient and her family regarding advance directives, community resources such as mobile meals and in-home care. Patient agreed to SW making a referral to these two community programs. SW will also mail patient's niece advance directive paperwork for their review and SW to assist with completion if appropriate. SW provided assessment of patient needs and comfort, active listening, education, supportive counseling and reassurance of support. SW completed referral to [facility]  PATIENT/CAREGIVER EDUCATION/ COPING:  Patient is hard of hearing. She is receptive ongoing palliative care/SW support. Patient's niece-Alicia Schwartz is her PCG, but she also is PCG for her mother which is patient's sister.  Alicia Schwartz is experiencing caregiver fatigue.  PERSONAL EMERGENCY PLAN:  911 can be activated for emergencies.  COMMUNITY RESOURCES COORDINATION/ HEALTH CARE NAVIGATION:  SW to complete a referral to mobile meals. No concerns noted.  FINANCIAL/LEGAL CONCERNS/INTERVENTIONS:  Non. Patient has Medicare and [employer].     SOCIAL HX:  Social History   Tobacco Use   Smoking status: Never   Smokeless tobacco: Never  Substance Use Topics   Alcohol use: No    CODE STATUS: Full Code ADVANCED DIRECTIVES: Education provided MOST FORM COMPLETE:  N HOSPICE EDUCATION PROVIDED: No  PPS: Patient is alert and oriented x3, but forgetful and hard of hearing. Patient lives alone and independently complete ADL's.   Duration of visit and documentation: 60 minutes.   [address] [place], [place]

## 2021-11-02 ENCOUNTER — Emergency Department (HOSPITAL_COMMUNITY): Payer: Medicare HMO

## 2021-11-02 ENCOUNTER — Emergency Department (HOSPITAL_COMMUNITY)
Admission: EM | Admit: 2021-11-02 | Discharge: 2021-11-03 | Disposition: A | Discharge status: Home / Self Care | Payer: Medicare HMO | Attending: Emergency Medicine | Admitting: Emergency Medicine

## 2021-11-02 DIAGNOSIS — R4182 Altered mental status, unspecified: Secondary | ICD-10-CM | POA: Diagnosis not present

## 2021-11-02 DIAGNOSIS — I959 Hypotension, unspecified: Secondary | ICD-10-CM | POA: Diagnosis not present

## 2021-11-02 DIAGNOSIS — R531 Weakness: Secondary | ICD-10-CM | POA: Insufficient documentation

## 2021-11-02 DIAGNOSIS — Z79899 Other long term (current) drug therapy: Secondary | ICD-10-CM | POA: Insufficient documentation

## 2021-11-02 DIAGNOSIS — R202 Paresthesia of skin: Secondary | ICD-10-CM | POA: Insufficient documentation

## 2021-11-02 DIAGNOSIS — R262 Difficulty in walking, not elsewhere classified: Secondary | ICD-10-CM | POA: Diagnosis not present

## 2021-11-02 DIAGNOSIS — I10 Essential (primary) hypertension: Secondary | ICD-10-CM | POA: Diagnosis not present

## 2021-11-02 LAB — CBC WITH DIFFERENTIAL/PLATELET
Abs Immature Granulocytes: 0.1 10*3/uL — ABNORMAL HIGH (ref 0.00–0.07)
Basophils Absolute: 0 10*3/uL (ref 0.0–0.1)
Basophils Relative: 0 %
Eosinophils Absolute: 0 10*3/uL (ref 0.0–0.5)
Eosinophils Relative: 0 %
HCT: 30.7 % — ABNORMAL LOW (ref 36.0–46.0)
Hemoglobin: 11 g/dL — ABNORMAL LOW (ref 12.0–15.0)
Immature Granulocytes: 1 %
Lymphocytes Relative: 7 %
Lymphs Abs: 1.3 10*3/uL (ref 0.7–4.0)
MCH: 35.7 pg — ABNORMAL HIGH (ref 26.0–34.0)
MCHC: 35.8 g/dL (ref 30.0–36.0)
MCV: 99.7 fL (ref 80.0–100.0)
Monocytes Absolute: 1.8 10*3/uL — ABNORMAL HIGH (ref 0.1–1.0)
Monocytes Relative: 9 %
Neutro Abs: 16 10*3/uL — ABNORMAL HIGH (ref 1.7–7.7)
Neutrophils Relative %: 83 %
Platelets: 234 10*3/uL (ref 150–400)
RBC: 3.08 MIL/uL — ABNORMAL LOW (ref 3.87–5.11)
RDW: 12 % (ref 11.5–15.5)
WBC: 19.3 10*3/uL — ABNORMAL HIGH (ref 4.0–10.5)
nRBC: 0 % (ref 0.0–0.2)

## 2021-11-02 LAB — COMPREHENSIVE METABOLIC PANEL
ALT: 46 U/L — ABNORMAL HIGH (ref 0–44)
AST: 59 U/L — ABNORMAL HIGH (ref 15–41)
Albumin: 3.6 g/dL (ref 3.5–5.0)
Alkaline Phosphatase: 93 U/L (ref 38–126)
Anion gap: 10 (ref 5–15)
BUN: 20 mg/dL (ref 8–23)
CO2: 25 mmol/L (ref 22–32)
Calcium: 9 mg/dL (ref 8.9–10.3)
Chloride: 93 mmol/L — ABNORMAL LOW (ref 98–111)
Creatinine, Ser: 1.29 mg/dL — ABNORMAL HIGH (ref 0.44–1.00)
GFR, Estimated: 40 mL/min — ABNORMAL LOW (ref >60)
Glucose, Bld: 127 mg/dL — ABNORMAL HIGH (ref 70–99)
Potassium: 3.3 mmol/L — ABNORMAL LOW (ref 3.5–5.1)
Sodium: 128 mmol/L — ABNORMAL LOW (ref 135–145)
Total Bilirubin: 0.6 mg/dL (ref 0.3–1.2)
Total Protein: 6.7 g/dL (ref 6.5–8.1)

## 2021-11-02 NOTE — ED Notes (Signed)
Pt was able to walk to the bedside commode with assistance. Pt was also able to walk down the hallway with the walker only and she had little difficulty.  ?

## 2021-11-02 NOTE — ED Provider Notes (Signed)
?[facility] EMERGENCY DEPARTMENT ?Provider Note ? ? ?CSN: [id number] ?Arrival date & time: 11/02/21  2133 ? ?  ? ?History ? ?Chief Complaint  ?Patient presents with  ? Weakness  ? ? ?Makynzie Yazmen Briones is a 88 y.o. female. ? ?88 year old female presents with worsening lower extremity weakness.  Patient states that she is had this for several weeks.  She does use a walker or wheelchair at home.  2 days ago patient had episode of left arm and hand tingling which lasted for approximate 2 hours.  EMS was summoned and patient not transported to the hospital.  Tonight she states is more difficult for her to walk.  Denies any lower back pain.  No bowel or bladder dysfunction.  No headache.  No return of the upper extremity weakness.  Called EMS and was transported here ? ? ?  ? ?Home Medications ?Prior to Admission medications   ?Medication Sig Start Date End Date Taking? Authorizing Provider  ?acetaminophen (TYLENOL) 325 MG tablet Take 2 tablets (650 mg total) by mouth every 6 (six) hours as needed for mild pain (or Fever >/= 101). 07/12/19   Calvert Cantor, MD  ?amLODipine (NORVASC) 10 MG tablet Take 1 tablet (10 mg total) by mouth daily. 07/12/19   Calvert Cantor, MD  ?bisacodyl (DULCOLAX) 10 MG suppository Place 1 suppository (10 mg total) rectally daily as needed for moderate constipation. ?Patient not taking: Reported on 06/28/2019 02/15/15   Joseph Art, DO  ?CALCIUM PO Take 1 tablet by mouth daily.    [provider]  ?cephALEXin (KEFLEX) 500 MG capsule Take 1 capsule (500 mg total) by mouth 3 (three) times daily. 07/12/19   Calvert Cantor, MD  ?docusate sodium (COLACE) 100 MG capsule Take 1 capsule (100 mg total) by mouth 2 (two) times daily. 02/15/15   Joseph Art, DO  ?ferrous sulfate 325 (65 FE) MG tablet Take 1 tablet (325 mg total) by mouth 3 (three) times daily after meals. ?Patient not taking: Reported on 06/28/2019 02/15/15   Joseph Art, DO  ?folic acid (FOLVITE) 1 MG tablet  Take 1 mg by mouth daily.    [provider]  ?HYDROcodone-acetaminophen (NORCO) 5-325 MG tablet Take 1-2 tablets by mouth every 6 (six) hours as needed for moderate pain (1 tab for moderate pain and 2 tabs for severe pain PRN). MAXIMUM TOTAL ACETAMINOPHEN DOSE IS 4000 MG PER DAY 07/12/19   Calvert Cantor, MD  ?ibuprofen (ADVIL) 200 MG tablet Take 1 tablet (200 mg total) by mouth every 6 (six) hours as needed for mild pain. 07/12/19   Calvert Cantor, MD  ?lisinopril (ZESTRIL) 20 MG tablet Take 1 tablet (20 mg total) by mouth daily. 07/12/19   Calvert Cantor, MD  ?metoprolol succinate (TOPROL-XL) 50 MG 24 hr tablet Take 50 mg by mouth every evening.  06/23/19   [provider]  ?Multiple Vitamins-Minerals (MULTIVITAMIN GUMMIES ADULT PO) Take 1 capsule by mouth daily.    [provider]  ?ondansetron (ZOFRAN) 4 MG tablet Take 1 tablet (4 mg total) by mouth every 6 (six) hours as needed for nausea. 07/12/19   Calvert Cantor, MD  ?polyethylene glycol (MIRALAX / GLYCOLAX) packet Take 17 g by mouth daily. ?Patient not taking: Reported on 06/28/2019 02/15/15   Joseph Art, DO  ?senna (SENOKOT) 8.6 MG TABS tablet Take 1 tablet (8.6 mg total) by mouth 2 (two) times daily as needed for mild constipation. 07/12/19   Calvert Cantor, MD  ?   ? ?  Allergies    ?Patient has no known allergies.   ? ?Review of Systems   ?Review of Systems  ?All other systems reviewed and are negative. ? ?Physical Exam ?Updated Vital Signs ?BP 106/80   Pulse 66   Temp 99.2 ?F (37.3 ?C) (Oral)   Resp 10   SpO2 99%  ?Physical Exam ?Vitals and nursing note reviewed.  ?Constitutional:   ?   General: She is not in acute distress. ?   Appearance: Normal appearance. She is well-developed. She is not toxic-appearing.  ?HENT:  ?   Head: Normocephalic and atraumatic.  ?Eyes:  ?   General: Lids are normal.  ?   Conjunctiva/sclera: Conjunctivae normal.  ?   Pupils: Pupils are equal, round, and reactive to light.  ?Neck:  ?   Thyroid: No  thyroid mass.  ?   Trachea: No tracheal deviation.  ?Cardiovascular:  ?   Rate and Rhythm: Normal rate and regular rhythm.  ?   Heart sounds: Normal heart sounds. No murmur heard. ?  No gallop.  ?Pulmonary:  ?   Effort: Pulmonary effort is normal. No respiratory distress.  ?   Breath sounds: Normal breath sounds. No stridor. No decreased breath sounds, wheezing, rhonchi or rales.  ?Abdominal:  ?   General: There is no distension.  ?   Palpations: Abdomen is soft.  ?   Tenderness: There is no abdominal tenderness. There is no rebound.  ?Musculoskeletal:     ?   General: No tenderness. Normal range of motion.  ?   Cervical back: Normal range of motion and neck supple.  ?Skin: ?   General: Skin is warm and dry.  ?   Findings: No abrasion or rash.  ?Neurological:  ?   General: No focal deficit present.  ?   Mental Status: She is alert and oriented to person, place, and time. Mental status is at baseline.  ?   GCS: GCS eye subscore is 4. GCS verbal subscore is 5. GCS motor subscore is 6.  ?   Cranial Nerves: No cranial nerve deficit.  ?   Sensory: No sensory deficit.  ?   Motor: Motor function is intact.  ?   Comments: Strength is 5 of 5 in upper as well as lower extremities  ?Psychiatric:     ?   Attention and Perception: Attention normal.     ?   Speech: Speech normal.     ?   Behavior: Behavior normal.  ? ? ?ED Results / Procedures / Treatments   ?Labs ?(all labs ordered are listed, but only abnormal results are displayed) ?Labs Reviewed  ?URINALYSIS, ROUTINE W REFLEX MICROSCOPIC  ?CBC WITH DIFFERENTIAL/PLATELET  ?COMPREHENSIVE METABOLIC PANEL  ? ? ?EKG ?EKG Interpretation ? ?Date/Time:  Thursday November 02 2021 21:47:01 EDT ?Ventricular Rate:  73 ?PR Interval:  194 ?QRS Duration: 90 ?QT Interval:  373 ?QTC Calculation: 411 ?R Axis:   84 ?Text Interpretation: Sinus rhythm Borderline right axis deviation Left ventricular hypertrophy No significant change since last tracing Confirmed by Lorre NickAllen, Griselda Tosh ([id number]) on 11/02/2021  9:54:57 PM ? ?Radiology ?No results found. ? ?Procedures ?Procedures  ? ? ?Medications Ordered in ED ?Medications - No data to display ? ?ED Course/ Medical Decision Making/ A&P ?  ?                        ?Medical Decision Making ?Amount and/or Complexity of Data Reviewed ?Labs: ordered. ?Radiology: ordered. ? ? ?Patient presented  here complaining of new lower extremity weakness.  She has no neurological findings suggestive of stroke.  Head CT per my interpretation did not show any acute findings.  EKG per my interpretation showed normal sinus rhythm.  Laboratory studies and urinalysis are pending at this time.  Care turned over to Dr. Orland Dec ? ? ? ? ? ? ? ?Final Clinical Impression(s) / ED Diagnoses ?Final diagnoses:  ?None  ? ? ?Rx / DC Orders ?ED Discharge Orders   ? ? None  ? ?  ? ? ?  ?Lorre Nick, MD ?11/02/21 2314 ? ?

## 2021-11-02 NOTE — ED Notes (Signed)
Pt off unit to CT

## 2021-11-02 NOTE — ED Triage Notes (Addendum)
Pt arrived via [facility] for cc of "reduced mobility"/weakness. Niece on scene reported that pt was evaluated by fire department on Tuesday for left sided numbness, hypertension, vision changes, and difficulty walking. Pt was cleared by fire and not transported to hospital at that time. Pt denies these symptoms at this time, but reports decrease in ability to walk recently and believes she had a stroke earlier this week. A&Ox4, GCS 15. Pt hard of hearing.  ? ?BP 165/70 ?HR 68 ?SPO2 100% RA ?RR 18 ?CBG 156 ?

## 2021-11-03 ENCOUNTER — Other Ambulatory Visit: Payer: BC Managed Care – PPO

## 2021-11-03 ENCOUNTER — Emergency Department (HOSPITAL_COMMUNITY): Payer: Medicare HMO

## 2021-11-03 ENCOUNTER — Other Ambulatory Visit: Payer: Self-pay

## 2021-11-03 ENCOUNTER — Emergency Department (HOSPITAL_COMMUNITY)
Admission: EM | Admit: 2021-11-03 | Discharge: 2021-11-07 | Disposition: A | Discharge status: Other Acute Inpatient Hospital | Payer: Medicare HMO | Attending: Emergency Medicine | Admitting: Emergency Medicine

## 2021-11-03 DIAGNOSIS — E871 Hypo-osmolality and hyponatremia: Secondary | ICD-10-CM | POA: Diagnosis not present

## 2021-11-03 DIAGNOSIS — S8011XA Contusion of right lower leg, initial encounter: Secondary | ICD-10-CM | POA: Diagnosis not present

## 2021-11-03 DIAGNOSIS — W19XXXA Unspecified fall, initial encounter: Secondary | ICD-10-CM

## 2021-11-03 DIAGNOSIS — D72829 Elevated white blood cell count, unspecified: Secondary | ICD-10-CM | POA: Diagnosis not present

## 2021-11-03 DIAGNOSIS — I10 Essential (primary) hypertension: Secondary | ICD-10-CM | POA: Diagnosis not present

## 2021-11-03 DIAGNOSIS — S8012XA Contusion of left lower leg, initial encounter: Secondary | ICD-10-CM | POA: Diagnosis not present

## 2021-11-03 DIAGNOSIS — Z043 Encounter for examination and observation following other accident: Secondary | ICD-10-CM | POA: Diagnosis not present

## 2021-11-03 DIAGNOSIS — R102 Pelvic and perineal pain: Secondary | ICD-10-CM | POA: Diagnosis not present

## 2021-11-03 DIAGNOSIS — R531 Weakness: Secondary | ICD-10-CM | POA: Insufficient documentation

## 2021-11-03 DIAGNOSIS — S0990XA Unspecified injury of head, initial encounter: Secondary | ICD-10-CM | POA: Diagnosis not present

## 2021-11-03 DIAGNOSIS — M5030 Other cervical disc degeneration, unspecified cervical region: Secondary | ICD-10-CM | POA: Diagnosis not present

## 2021-11-03 DIAGNOSIS — S199XXA Unspecified injury of neck, initial encounter: Secondary | ICD-10-CM | POA: Diagnosis not present

## 2021-11-03 DIAGNOSIS — M6281 Muscle weakness (generalized): Secondary | ICD-10-CM | POA: Diagnosis not present

## 2021-11-03 DIAGNOSIS — W01198A Fall on same level from slipping, tripping and stumbling with subsequent striking against other object, initial encounter: Secondary | ICD-10-CM | POA: Diagnosis not present

## 2021-11-03 DIAGNOSIS — S8991XA Unspecified injury of right lower leg, initial encounter: Secondary | ICD-10-CM | POA: Diagnosis present

## 2021-11-03 DIAGNOSIS — R5381 Other malaise: Secondary | ICD-10-CM

## 2021-11-03 DIAGNOSIS — M79604 Pain in right leg: Secondary | ICD-10-CM | POA: Diagnosis not present

## 2021-11-03 DIAGNOSIS — T07XXXA Unspecified multiple injuries, initial encounter: Secondary | ICD-10-CM

## 2021-11-03 LAB — URINALYSIS, ROUTINE W REFLEX MICROSCOPIC
Bilirubin Urine: NEGATIVE
Bilirubin Urine: NEGATIVE
Glucose, UA: NEGATIVE mg/dL
Glucose, UA: NEGATIVE mg/dL
Hgb urine dipstick: NEGATIVE
Ketones, ur: NEGATIVE mg/dL
Ketones, ur: NEGATIVE mg/dL
Leukocytes,Ua: NEGATIVE
Nitrite: NEGATIVE
Nitrite: NEGATIVE
Protein, ur: NEGATIVE mg/dL
Protein, ur: 30 mg/dL — AB
Specific Gravity, Urine: 1.011 (ref 1.005–1.030)
Specific Gravity, Urine: 1.011 (ref 1.005–1.030)
pH: 6 (ref 5.0–8.0)
pH: 7 (ref 5.0–8.0)

## 2021-11-03 LAB — BASIC METABOLIC PANEL
Anion gap: 8 (ref 5–15)
BUN: 19 mg/dL (ref 8–23)
CO2: 26 mmol/L (ref 22–32)
Calcium: 9.2 mg/dL (ref 8.9–10.3)
Chloride: 96 mmol/L — ABNORMAL LOW (ref 98–111)
Creatinine, Ser: 1.12 mg/dL — ABNORMAL HIGH (ref 0.44–1.00)
GFR, Estimated: 47 mL/min — ABNORMAL LOW (ref >60)
Glucose, Bld: 104 mg/dL — ABNORMAL HIGH (ref 70–99)
Potassium: 3.5 mmol/L (ref 3.5–5.1)
Sodium: 130 mmol/L — ABNORMAL LOW (ref 135–145)

## 2021-11-03 LAB — CBC
HCT: 28.8 % — ABNORMAL LOW (ref 36.0–46.0)
Hemoglobin: 9.9 g/dL — ABNORMAL LOW (ref 12.0–15.0)
MCH: 34.9 pg — ABNORMAL HIGH (ref 26.0–34.0)
MCHC: 34.4 g/dL (ref 30.0–36.0)
MCV: 101.4 fL — ABNORMAL HIGH (ref 80.0–100.0)
Platelets: 217 10*3/uL (ref 150–400)
RBC: 2.84 MIL/uL — ABNORMAL LOW (ref 3.87–5.11)
RDW: 12.1 % (ref 11.5–15.5)
WBC: 15.1 10*3/uL — ABNORMAL HIGH (ref 4.0–10.5)
nRBC: 0 % (ref 0.0–0.2)

## 2021-11-03 NOTE — ED Notes (Addendum)
Introduced self to pt. Pt very hard of hearing. Reporting discomfort on her left side from fall today. Primary concern voiced by patient is receiving something to eat and drink. Pt made aware of need to wait for scans to return prior to eating and drinking. MD made aware of request. ?

## 2021-11-03 NOTE — Discharge Instructions (Addendum)
Continue current care; including medications, and physical therapy. ?

## 2021-11-03 NOTE — ED Triage Notes (Signed)
Via EMS from home. Pt lives alone. Apx 3rd visit this week, pt dc'd this morning for same complaint. Pt GCS 15 states shes weak all over, been going on for apx 1 month. Pt had fall from standing. EMS stated pt no using walker as needed. Denies blood thinners.  ?

## 2021-11-03 NOTE — ED Provider Notes (Signed)
?[facility] ?Provider Note ? ? ?CSN: [id number] ?Arrival date & time: 11/03/21  1654 ? ?  ? ?History ? ?Chief Complaint  ?Patient presents with  ? Fall  ? Weakness  ? ? ?Alicia Schwartz is a 88 y.o. female.  Presenting to ER with concern for fall.  Per EMS, patient has had multiple recent falls, reportedly fall from standing.  Patient denies passing out.  She does believe that she hit her head.  She denies having pain anywhere at present.  Initial triage reported weakness generalized.  Patient denies any focal weakness at this time.  No numbness.  These issues have been going on for the past month. ? ?Patient evaluated by Dr. Freida BusmanAllen on 4/13.  Discharged. ? ?HPI ? ?  ? ?Home Medications ?Prior to Admission medications   ?Medication Sig Start Date End Date Taking? Authorizing Provider  ?acetaminophen (TYLENOL) 325 MG tablet Take 2 tablets (650 mg total) by mouth every 6 (six) hours as needed for mild pain (or Fever >/= 101). 07/12/19   Calvert Cantorizwan, Saima, MD  ?amLODipine (NORVASC) 10 MG tablet Take 1 tablet (10 mg total) by mouth daily. ?Patient not taking: Reported on 11/02/2021 07/12/19   Calvert Cantorizwan, Saima, MD  ?bisacodyl (DULCOLAX) 10 MG suppository Place 1 suppository (10 mg total) rectally daily as needed for moderate constipation. ?Patient not taking: Reported on 06/28/2019 02/15/15   Joseph ArtVann, Jessica U, DO  ?CALCIUM PO Take 1 tablet by mouth daily.    [provider]  ?docusate sodium (COLACE) 100 MG capsule Take 1 capsule (100 mg total) by mouth 2 (two) times daily. 02/15/15   Joseph ArtVann, Jessica U, DO  ?ferrous sulfate 325 (65 FE) MG tablet Take 1 tablet (325 mg total) by mouth 3 (three) times daily after meals. ?Patient not taking: Reported on 06/28/2019 02/15/15   Joseph ArtVann, Jessica U, DO  ?folic acid (FOLVITE) 1 MG tablet Take 1 mg by mouth daily.    [provider]  ?HYDROcodone-acetaminophen (NORCO) 5-325 MG tablet Take 1-2 tablets by mouth every 6 (six) hours as needed for  moderate pain (1 tab for moderate pain and 2 tabs for severe pain PRN). MAXIMUM TOTAL ACETAMINOPHEN DOSE IS 4000 MG PER DAY 07/12/19   Calvert Cantorizwan, Saima, MD  ?ibuprofen (ADVIL) 200 MG tablet Take 1 tablet (200 mg total) by mouth every 6 (six) hours as needed for mild pain. 07/12/19   Calvert Cantorizwan, Saima, MD  ?lisinopril (ZESTRIL) 20 MG tablet Take 1 tablet (20 mg total) by mouth daily. ?Patient not taking: Reported on 11/02/2021 07/12/19   Calvert Cantorizwan, Saima, MD  ?losartan-hydrochlorothiazide (HYZAAR) 50-12.5 MG tablet Take 1 tablet by mouth daily. 10/19/21   [provider]  ?metoprolol succinate (TOPROL-XL) 50 MG 24 hr tablet Take 50 mg by mouth every evening.  06/23/19   [provider]  ?Multiple Vitamin (MULTIVITAMIN) tablet Take 1 tablet by mouth daily.    [provider]  ?ondansetron (ZOFRAN) 4 MG tablet Take 1 tablet (4 mg total) by mouth every 6 (six) hours as needed for nausea. 07/12/19   Calvert Cantorizwan, Saima, MD  ?polyethylene glycol (MIRALAX / GLYCOLAX) packet Take 17 g by mouth daily. ?Patient not taking: Reported on 06/28/2019 02/15/15   Joseph ArtVann, Jessica U, DO  ?senna (SENOKOT) 8.6 MG TABS tablet Take 1 tablet (8.6 mg total) by mouth 2 (two) times daily as needed for mild constipation. 07/12/19   Calvert Cantorizwan, Saima, MD  ?   ? ?Allergies    ?Patient has no known allergies.   ? ?  Review of Systems   ?Review of Systems  ?Constitutional:  Negative for chills and fever.  ?HENT:  Negative for ear pain and sore throat.   ?Eyes:  Negative for pain and visual disturbance.  ?Respiratory:  Negative for cough and shortness of breath.   ?Cardiovascular:  Negative for chest pain and palpitations.  ?Gastrointestinal:  Negative for abdominal pain and vomiting.  ?Genitourinary:  Negative for dysuria and hematuria.  ?Musculoskeletal:  Positive for arthralgias. Negative for back pain.  ?Skin:  Negative for color change and rash.  ?Neurological:  Positive for weakness. Negative for seizures and syncope.  ?All other systems  reviewed and are negative. ? ?Physical Exam ?Updated Vital Signs ?BP (!) 155/70   Pulse 77   Temp 99 ?F (37.2 ?C) (Oral)   Resp 20   Ht 5' (1.524 m)   Wt 44 kg   SpO2 98%   BMI 18.94 kg/m?  ?Physical Exam ?Vitals and nursing note reviewed.  ?Constitutional:   ?   General: She is not in acute distress. ?   Appearance: She is well-developed.  ?HENT:  ?   Head: Normocephalic and atraumatic.  ?Eyes:  ?   Conjunctiva/sclera: Conjunctivae normal.  ?Cardiovascular:  ?   Rate and Rhythm: Normal rate and regular rhythm.  ?   Heart sounds: No murmur heard. ?Pulmonary:  ?   Effort: Pulmonary effort is normal. No respiratory distress.  ?   Breath sounds: Normal breath sounds.  ?Abdominal:  ?   Palpations: Abdomen is soft.  ?   Tenderness: There is no abdominal tenderness.  ?Musculoskeletal:     ?   General: No swelling.  ?   Cervical back: Neck supple.  ?   Comments: Back: no C, T, L spine TTP, no step off or deformity ?RUE: no TTP throughout, no deformity, normal joint ROM, radial pulse intact, distal sensation and motor intact ?LUE: no TTP throughout, no deformity, normal joint ROM, radial pulse intact, distal sensation and motor intact ?RLE: Some mild ecchymosis to the distal lower leg, normal joint ROM, distal pulse, sensation and motor intact ?LLE: Some mild ecchymosis to the distal lower leg, normal joint ROM, distal pulse, sensation and motor intact  ?Skin: ?   General: Skin is warm and dry.  ?   Capillary Refill: Capillary refill takes less than 2 seconds.  ?Neurological:  ?   Mental Status: She is alert.  ?   Comments: Normal strength and sensation in bilateral upper and lower extremities, patient is alert and answering questions appropriately.  ?Psychiatric:     ?   Mood and Affect: Mood normal.  ? ? ?ED Results / Procedures / Treatments   ?Labs ?(all labs ordered are listed, but only abnormal results are displayed) ?Labs Reviewed  ?CBC - Abnormal; Notable for the following components:  ?    Result Value  ? WBC  15.1 (*)   ? RBC 2.84 (*)   ? Hemoglobin 9.9 (*)   ? HCT 28.8 (*)   ? MCV 101.4 (*)   ? MCH 34.9 (*)   ? All other components within normal limits  ?BASIC METABOLIC PANEL - Abnormal; Notable for the following components:  ? Sodium 130 (*)   ? Chloride 96 (*)   ? Glucose, Bld 104 (*)   ? Creatinine, Ser 1.12 (*)   ? GFR, Estimated 47 (*)   ? All other components within normal limits  ?URINALYSIS, ROUTINE W REFLEX MICROSCOPIC - Abnormal; Notable for the following components:  ?  APPearance HAZY (*)   ? Hgb urine dipstick SMALL (*)   ? Protein, ur 30 (*)   ? Leukocytes,Ua MODERATE (*)   ? Bacteria, UA RARE (*)   ? All other components within normal limits  ?URINE CULTURE  ? ? ?EKG ?EKG Interpretation ? ?Date/Time:  Friday November 03 2021 20:10:27 EDT ?Ventricular Rate:  83 ?PR Interval:  192 ?QRS Duration: 94 ?QT Interval:  332 ?QTC Calculation: 390 ?R Axis:   79 ?Text Interpretation: Sinus rhythm LVH with secondary repolarization abnormality Confirmed by Marianna Fuss ([id number]) on 11/03/2021 8:54:46 PM ? ?Radiology ?DG Chest 1 View ? ?Result Date: 11/03/2021 ?CLINICAL DATA:  Fall. EXAM: CHEST  1 VIEW COMPARISON:  Chest x-ray 01/10/2007. FINDINGS: Aorta is ectatic, unchanged. Heart size is mildly enlarged and increased from prior. There is no focal lung infiltrate. Costophrenic angles are clear. No pneumothorax or acute fracture. Vertebroplasty changes are seen at a lower thoracic level. IMPRESSION: 1. No acute cardiopulmonary process. 2. New mild cardiomegaly. Electronically Signed   By: Darliss Cheney M.D.   On: 11/03/2021 21:25  ? ?DG Pelvis 1-2 Views ? ?Result Date: 11/03/2021 ?CLINICAL DATA:  Fall, pain. EXAM: PELVIS - 1-2 VIEW COMPARISON:  Pelvis x-ray 02/12/2015. FINDINGS: Diffuse osteopenia limits evaluation for subtle nondisplaced fracture. There is no acute fracture or dislocation identified. Joint spaces are maintained. Right-sided hip screw is partially visualized. Surgical sutures overlie the lower abdomen.  IMPRESSION: 1. Limited by osteopenia. 2. No acute fracture or dislocation identified. If there is high clinical concern for occult fracture, consider further evaluation with CT. Electronically Signed   By: Claudina Lick

## 2021-11-03 NOTE — ED Provider Notes (Signed)
?  Provider Note ?MRN:  [id number]  ?Arrival date & time: 11/03/21    ?ED Course and Medical Decision Making  ?Assumed care from Dr. Freida Busman at shift change. ? ?Patient complaining of generalized weakness, legs feel weak.  Chronic issue but a bit worse.  Awaiting labs, CT head, urinalysis. ? ?On my assessment patient is awake and alert, conversant, very hard of hearing.  She is pleasant, smiling, largely without complaints.  Able to lift her legs easily off the bed.  Legs neurovascularly intact.  Vital signs are normal, she is in no acute distress.  Abdomen soft and nontender, no meningismus.  Labs reveal a prominent leukocytosis of unclear significance.  Particular attention paid to chest x-ray and urinalysis, which showed no evidence of infection.  No evidence of infection elsewhere.  Suspected acute on chronic weakness issue likely related to deconditioning, attempted to discuss with family, no answer.  No indication for further testing or admission, appropriate for discharge with strict return precautions. ? ?Procedures ? ?Final Clinical Impressions(s) / ED Diagnoses  ? ?  ICD-10-CM   ?1. Weakness  R53.1   ?  ?  ?ED Discharge Orders   ? ? None  ? ?  ?  ? ? ?Discharge Instructions   ? ?  ?You were evaluated in the Emergency Department and after careful evaluation, we did not find any emergent condition requiring admission or further testing in the hospital. ? ?Your exam/testing today is overall reassuring.  Recommend follow-up with your regular doctor to discuss your symptoms. ? ?Please return to the Emergency Department if you experience any worsening of your condition.   Thank you for allowing [place] to be a part of your care. ? ? ? ?Elmer Sow. Pilar Plate, MD ?[facility] Emergency Medicine ?[facility] [email or web address] ? ?  ?Sabas Sous, MD ?11/03/21 [phone] ? ?

## 2021-11-03 NOTE — ED Notes (Addendum)
Discharge instructions reviewed with pt - pt with no questions - pt family at bedside to take pt home  ?

## 2021-11-03 NOTE — ED Notes (Signed)
Attempted to contact Niece for ride home upon d/c of this patient. Awaiting response at this time.  ?

## 2021-11-03 NOTE — ED Notes (Signed)
Pt niece called to come pick pt up - Pt niece to bring pt walker  ?

## 2021-11-03 NOTE — Discharge Instructions (Signed)
You were evaluated in the Emergency Department and after careful evaluation, we did not find any emergent condition requiring admission or further testing in the hospital. ? ?Your exam/testing today is overall reassuring.  Recommend follow-up with your regular doctor to discuss your symptoms. ? ?Please return to the Emergency Department if you experience any worsening of your condition.   Thank you for allowing [place] to be a part of your care. ?

## 2021-11-04 MED ORDER — LOSARTAN POTASSIUM-HCTZ 50-12.5 MG PO TABS: 1.0000 | ORAL_TABLET | Freq: Every day | ORAL | Status: DC

## 2021-11-04 MED ORDER — SPIRONOLACTONE 25 MG PO TABS
25.0000 mg | ORAL_TABLET | Freq: Every day | ORAL | Status: DC
Start: 2021-11-04 — End: 2021-11-08
  Administered 2021-11-04 – 2021-11-06 (×3): 25 mg via ORAL
  Filled 2021-11-04 (×4): qty 1

## 2021-11-04 MED ORDER — HYDROCHLOROTHIAZIDE 12.5 MG PO TABS
12.5000 mg | ORAL_TABLET | Freq: Every day | ORAL | Status: DC
  Administered 2021-11-04 – 2021-11-07 (×4): 12.5 mg via ORAL
  Filled 2021-11-04 (×4): qty 1

## 2021-11-04 MED ORDER — LOSARTAN POTASSIUM 50 MG PO TABS
50.0000 mg | ORAL_TABLET | Freq: Every day | ORAL | Status: DC
  Administered 2021-11-04 – 2021-11-07 (×4): 50 mg via ORAL
  Filled 2021-11-04 (×4): qty 1

## 2021-11-04 MED ORDER — METOPROLOL SUCCINATE ER 25 MG PO TB24
50.0000 mg | ORAL_TABLET | Freq: Every day | ORAL | Status: DC
  Administered 2021-11-04 – 2021-11-07 (×4): 50 mg via ORAL
  Filled 2021-11-04 (×4): qty 2

## 2021-11-04 NOTE — ED Notes (Signed)
Family contacts listed in chart attempted again, no response at this time. CN made aware.  ?

## 2021-11-04 NOTE — ED Notes (Signed)
Assisted to b/r by staff/DNP ?

## 2021-11-04 NOTE — ED Notes (Signed)
Alicia Schwartz [phone] would like an update immediately ?

## 2021-11-04 NOTE — Care Management (Signed)
Called Lorella Nimrod, who requested a update. Left confidential voice message for him to call back in the AM ?

## 2021-11-04 NOTE — TOC Initial Note (Signed)
Transition of Care (TOC) - Initial/Assessment Note  ? ? ?Patient Details  ?Name: Alicia Schwartz ?MRN: [id number] ?Date of Birth: 10/24/1930 ? ?Transition of Care (TOC) CM/SW Contact:    ?Lockie Pares, RN ?Phone Number: ?11/04/2021, 9:40 AM ? ?Clinical Narrative:                 ? ?Patient presents with weakness falls, Lives by self in trailer. She has a niece , but the niece is very concerned about her going home and being unstable. PT consult placed for recommendations.  ?TOC will follow for needs, recommendations, and transitions. ?  ?Barriers to Discharge: Continued Medical Work up ? ? ?Patient Goals and CMS Choice ?  ?  ?  ? ?Expected Discharge Plan and Services ?  ?  ?  ?  ?Living arrangements for the past 2 months: Mobile Home ?                ?  ?  ?  ?  ?  ?  ?  ?  ?  ?  ? ?Prior Living Arrangements/Services ?Living arrangements for the past 2 months: Mobile Home ?Lives with:: Self ?  ?       ?Need for Family Participation in Patient Care: Yes (Comment) ?Care giver support system in place?: No (comment) ?  ?Criminal Activity/Legal Involvement Pertinent to Current Situation/Hospitalization: No - Comment as needed ? ?Activities of Daily Living ?  ?  ? ?Permission Sought/Granted ?  ?  ?   ?   ?   ?   ? ?Emotional Assessment ?  ?  ?  ?  ?Alcohol / Substance Use: Not Applicable ?  ? ?Admission diagnosis:  Fall? ?Patient Active Problem List  ? Diagnosis Date Noted  ? Hyponatremia 07/11/2019  ? Closed T12 fracture (HCC) 07/04/2019  ? Hypertensive urgency 07/04/2019  ? Back pain 07/04/2019  ? Pressure injury of skin 07/04/2019  ? Postoperative anemia due to acute blood loss 02/14/2015  ? Paroxysmal supraventricular tachycardia (HCC) 02/12/2015  ? Fracture, subtrochanteric, right femur, closed (HCC) 02/11/2015  ? Fall due to stumbling 02/11/2015  ? Anxiety state 09/19/2006  ? HYPERTENSION, BENIGN SYSTEMIC 09/19/2006  ? ?PCP:  Aida Puffer, MD ?Pharmacy:   ?[id number] - [place], [place] -  [address] ?[address] ?[place] [address] ?Phone: [phone] Fax: [phone] ? ? ? ? ?Social Determinants of Health (SDOH) Interventions ?  ? ?Readmission Risk Interventions ?   ? View : No data to display.  ?  ?  ?  ? ? ? ?

## 2021-11-04 NOTE — ED Notes (Signed)
Alert, NAD, calm, eating 

## 2021-11-04 NOTE — TOC Initial Note (Signed)
Transition of Care (TOC) - Initial/Assessment Note  ? ? ?Patient Details  ?Name: Alicia Schwartz ?MRN: [id number] ?Date of Birth: 09/13/1930 ? ?Transition of Care (TOC) CM/SW Contact:    ?Lockie Pares, RN ?Phone Number: ?11/04/2021, 1:37 PM ? ?Clinical Narrative:                 ? ?Spoke to patient at bedside explained role. PT had been messaged earlier about a time for assessment. They will be at bedside around 2PM. Patient states she is weak, cannot walk and would like to go to SNF. She stated she has been in [facility] previously and  "Loved it" It is documented that next of kin, niece is very concerned about her returning home. Patient is aware that if her assessment reveals placement, she will likely be in the ED boarding until Monday, due to unsafe situation at home ?  TOC to follow for needs recommendations,and transitions. ? ?Barriers to Discharge: Continued Medical Work up ? ? ?Patient Goals and CMS Choice ?  ?  ?  ? ?Expected Discharge Plan and Services ?  ?  ? SNF ?  ?Living arrangements for the past 2 months: Mobile Home ?                ?  ?  ?  ?  ?  ?  ?  ?  ?  ?  ? ?Prior Living Arrangements/Services ?Living arrangements for the past 2 months: Mobile Home ?Lives with:: Self ?  ?       ?Need for Family Participation in Patient Care: Yes (Comment) ?Care giver support system in place?: No (comment) ?  ?Criminal Activity/Legal Involvement Pertinent to Current Situation/Hospitalization: No - Comment as needed ? ?Activities of Daily Living ?  ?  ? ?Permission Sought/Granted ?  ?  ?   ?   ?   ?   ? ?Emotional Assessment ?  ?  ?  ?  ?Alcohol / Substance Use: Not Applicable ?  ? ?Admission diagnosis:  Fall? ?Patient Active Problem List  ? Diagnosis Date Noted  ? Hyponatremia 07/11/2019  ? Closed T12 fracture (HCC) 07/04/2019  ? Hypertensive urgency 07/04/2019  ? Back pain 07/04/2019  ? Pressure injury of skin 07/04/2019  ? Postoperative anemia due to acute blood loss 02/14/2015  ? Paroxysmal  supraventricular tachycardia (HCC) 02/12/2015  ? Fracture, subtrochanteric, right femur, closed (HCC) 02/11/2015  ? Fall due to stumbling 02/11/2015  ? Anxiety state 09/19/2006  ? HYPERTENSION, BENIGN SYSTEMIC 09/19/2006  ? ?PCP:  Aida Puffer, MD ?Pharmacy:   ?[facility] - [place], [place] - [address] ?[address] ?[place] [address] ?Phone: [phone] Fax: [phone] ? ? ? ? ?Social Determinants of Health (SDOH) Interventions ?  ? ?Readmission Risk Interventions ?   ? View : No data to display.  ?  ?  ?  ? ? ? ?

## 2021-11-04 NOTE — [place] FL2 (Signed)
?[place] MEDICAID FL2 LEVEL OF CARE SCREENING TOOL  ?  ? ?IDENTIFICATION  ?Patient Name: ?Alicia Schwartz Birthdate: [birth year] Sex: female Admission Date (Current Location): ?11/03/2021  ?[place] and [place] Number: ? [place] ?  Facility and Address:  ?[facility] [place] [address], [place], [place] [address] ?     Provider Number: ?[id number]  ?Attending Physician Name and Address:  ?Default, Provider, MD ? Relative Name and Phone Number:  ?Jayme Cloud, niece, [phone] ?   ?Current Level of Care: ?Other (Comment) (emergency department) Recommended Level of Care: ?[place] Prior Approval Number: ?  ? ?Date Approved/Denied: ?  PASRR Number: ?[id number] ? ?Discharge Plan: ?Home ?  ? ?Current Diagnoses: ?Patient Active Problem List  ? Diagnosis Date Noted  ? Hyponatremia 07/11/2019  ? Closed T12 fracture ([place]) 07/04/2019  ? Hypertensive urgency 07/04/2019  ? Back pain 07/04/2019  ? Pressure injury of skin 07/04/2019  ? Postoperative anemia due to acute blood loss 02/14/2015  ? Paroxysmal supraventricular tachycardia ([place]) 02/12/2015  ? Fracture, subtrochanteric, right femur, closed ([place]) 02/11/2015  ? Fall due to stumbling 02/11/2015  ? Anxiety state 09/19/2006  ? HYPERTENSION, BENIGN SYSTEMIC 09/19/2006  ? ? ?Orientation RESPIRATION BLADDER Height & Weight   ?  ?Self, Time, Situation, Place ? Normal Continent Weight: 97 lb (44 kg) ?Height:  5' (152.4 cm)  ?BEHAVIORAL SYMPTOMS/MOOD NEUROLOGICAL BOWEL NUTRITION STATUS  ?    Continent Diet (see AVS)  ?AMBULATORY STATUS COMMUNICATION OF NEEDS Skin   ?Extensive Assist Verbally Normal ?  ?  ?  ?    ?     ?     ? ? ?Personal Care Assistance Level of Assistance  ?Feeding, Bathing, Dressing Bathing Assistance: Limited assistance ?Feeding assistance: Independent ?Dressing Assistance: Limited assistance ?   ? ?Functional Limitations Info  ?    ?  ?   ? ? ?SPECIAL CARE FACTORS FREQUENCY  ?PT (By licensed PT), OT (By licensed  OT)   ?  ?PT Frequency: 5x weekly ?OT Frequency: 5x weekly ?  ?  ?  ?   ? ? ?Contractures Contractures Info: Not present  ? ? ?Additional Factors Info  ?Code Status, Allergies Code Status Info: DNR ?Allergies Info: NKDA ?  ?  ?  ?   ? ?Current Medications (11/04/2021):  This is the current hospital active medication list ?Current Facility-Administered Medications  ?Medication Dose Route Frequency Provider Last Rate Last Admin  ? hydrochlorothiazide (HYDRODIURIL) tablet 12.5 mg  12.5 mg Oral Daily Cardama, Grayce Sessions, MD   12.5 mg at 11/04/21 1001  ? losartan (COZAAR) tablet 50 mg  50 mg Oral Daily Cardama, Grayce Sessions, MD   50 mg at 11/04/21 1002  ? metoprolol succinate (TOPROL-XL) 24 hr tablet 50 mg  50 mg Oral Daily Cardama, Grayce Sessions, MD   50 mg at 11/04/21 1001  ? spironolactone (ALDACTONE) tablet 25 mg  25 mg Oral QHS Cardama, Grayce Sessions, MD      ? ?Current Outpatient Medications  ?Medication Sig Dispense Refill  ? acetaminophen (TYLENOL) 325 MG tablet Take 2 tablets (650 mg total) by mouth every 6 (six) hours as needed for mild pain (or Fever >/= 101).    ? CALCIUM PO Take 1 tablet by mouth daily.    ? ibuprofen (ADVIL) 200 MG tablet Take 1 tablet (200 mg total) by mouth every 6 (six) hours as needed for mild pain. 30 tablet 0  ? losartan-hydrochlorothiazide (HYZAAR) 50-12.5 MG tablet Take 1  tablet by mouth daily.    ? metoprolol succinate (TOPROL-XL) 50 MG 24 hr tablet Take 50 mg by mouth daily.    ? Multiple Vitamin (MULTIVITAMIN) tablet Take 1 tablet by mouth daily.    ? spironolactone (ALDACTONE) 25 MG tablet Take 25 mg by mouth at bedtime.    ? amLODipine (NORVASC) 10 MG tablet Take 1 tablet (10 mg total) by mouth daily. (Patient not taking: Reported on 11/02/2021)    ? bisacodyl (DULCOLAX) 10 MG suppository Place 1 suppository (10 mg total) rectally daily as needed for moderate constipation. (Patient not taking: Reported on 06/28/2019) 12 suppository 0  ? docusate sodium (COLACE) 100 MG capsule  Take 1 capsule (100 mg total) by mouth 2 (two) times daily. (Patient not taking: Reported on 11/04/2021) 10 capsule 0  ? ferrous sulfate 325 (65 FE) MG tablet Take 1 tablet (325 mg total) by mouth 3 (three) times daily after meals. (Patient not taking: Reported on [id number])  3  ? folic acid (FOLVITE) 1 MG tablet Take 1 mg by mouth daily. (Patient not taking: Reported on 11/04/2021)    ? HYDROcodone-acetaminophen (NORCO) 5-325 MG tablet Take 1-2 tablets by mouth every 6 (six) hours as needed for moderate pain (1 tab for moderate pain and 2 tabs for severe pain PRN). MAXIMUM TOTAL ACETAMINOPHEN DOSE IS 4000 MG PER DAY (Patient not taking: Reported on 11/04/2021) 15 tablet 0  ? lisinopril (ZESTRIL) 20 MG tablet Take 1 tablet (20 mg total) by mouth daily. (Patient not taking: Reported on 11/02/2021)    ? ondansetron (ZOFRAN) 4 MG tablet Take 1 tablet (4 mg total) by mouth every 6 (six) hours as needed for nausea. (Patient not taking: Reported on 11/04/2021) 20 tablet 0  ? polyethylene glycol (MIRALAX / GLYCOLAX) packet Take 17 g by mouth daily. (Patient not taking: Reported on 06/28/2019) 14 each 0  ? senna (SENOKOT) 8.6 MG TABS tablet Take 1 tablet (8.6 mg total) by mouth 2 (two) times daily as needed for mild constipation. (Patient not taking: Reported on 11/04/2021) 120 tablet 0  ? ? ? ?Discharge Medications: ?Please see discharge summary for a list of discharge medications. ? ?Relevant Imaging Results: ? ?Relevant Lab Results: ? ? ?Additional Information ?  ? ?Alfredia Ferguson, LCSW ? ? ? ? ?

## 2021-11-04 NOTE — Evaluation (Signed)
Physical Therapy Evaluation ?Patient Details ?Name: Alicia Schwartz ?MRN: [id number] ?DOB: 01-03-1931 ?Today's Date: 11/04/2021 ? ?History of Present Illness ? 88 year old female presents with worsening lower extremity weakness.  Patient states that she has had this for several weeks.  She does use a walker or wheelchair at home.  Earlier in week patient had episode of left arm and hand tingling which lasted for approximate 2 hours. Head CT negative for acute findings; discharged from ED 11/03/21 and returned the same day s/p fall. CT head and c-spine negative  ?Clinical Impression ?  ?Pt admitted secondary to problem above with deficits below. PTA patient was living alone in trailer with 5 steps to enter. She was recently seen in ED for bil leg weakness and discharged home where she fell.  Pt currently requires moderate assist for bed mobility to sit EOB. Unable to safely attempt standing due to weakness and patient's short stature (4'10") on a stretcher in ED. Based on her reports of increased weakness (with hip flexion, extension and knee extension all weak) and decline in function, feel she could benefit from skilled PT. See additional problems listed below. Will continue to follow acutely to maximize functional mobility independence and safety.   ?   ?   ? ?Recommendations for follow up therapy are one component of a multi-disciplinary discharge planning process, led by the attending physician.  Recommendations may be updated based on patient status, additional functional criteria and insurance authorization. ? ?Follow Up Recommendations Skilled nursing-short term rehab (<3 hours/day) ? ?  ?Assistance Recommended at Discharge Frequent or constant Supervision/Assistance  ?Patient can return home with the following ? A lot of help with walking and/or transfers;A lot of help with bathing/dressing/bathroom;Assistance with cooking/housework;Assist for transportation;Help with stairs or ramp for entrance ? ?   ?Equipment Recommendations None recommended by PT  ?Recommendations for Other Services ? OT consult  ?  ?Functional Status Assessment Patient has had a recent decline in their functional status and demonstrates the ability to make significant improvements in function in a reasonable and predictable amount of time.  ? ?  ?Precautions / Restrictions Precautions ?Precautions: Fall ?Precaution Comments: fell 4/14 after discharge home from ED ?Restrictions ?Weight Bearing Restrictions: No  ? ?  ? ?Mobility ? Bed Mobility ?Overal bed mobility: Needs Assistance ?Bed Mobility: Supine to Sit, Sit to Supine, Rolling ?Rolling: Min assist (with rail; rt and lt) ?  ?Supine to sit: Mod assist ?Sit to supine: Mod assist ?  ?General bed mobility comments: on ED stretcher with HOB elevated and pt required assist to move RLE (painful) over EOB and to raise torso to upright; return to supine required assist to raise both legs onto stretcher ?  ? ?Transfers ?  ?  ?  ?  ?  ?  ?  ?  ?  ?General transfer comment: unable to safely attempt from ED stretcher with 4'10" patient with weakness in bil LEs (her feet are literally 12" from reaching the floor) ?  ? ?Ambulation/Gait ?  ?  ?  ?  ?  ?  ?  ?General Gait Details: unable to safely transfer off ED stretcher to attempt ambulation ? ?Stairs ?  ?  ?  ?  ?  ? ?Wheelchair Mobility ?  ? ?Modified Rankin (Stroke Patients Only) ?  ? ?  ? ?Balance Overall balance assessment: Needs assistance ?Sitting-balance support: Bilateral upper extremity supported, Feet unsupported ?Sitting balance-Leahy Scale: Poor ?Sitting balance - Comments: pt unable to reach feet to floor  due to short stature and tall ED stretcher ?  ?  ?  ?Standing balance comment: unable to safely assess from ED stretcher; +fall at home ?  ?  ?  ?  ?  ?  ?  ?  ?  ?  ?  ?   ? ? ? ?Pertinent Vitals/Pain Pain Assessment ?Pain Assessment: Faces ?Faces Pain Scale: Hurts whole lot ?Pain Location: right hip and side ?Pain Descriptors /  Indicators: Grimacing, Guarding ?Pain Intervention(s): Limited activity within patient's tolerance, Monitored during session, Repositioned  ? ? ?Home Living Family/patient expects to be discharged to:: Skilled nursing facility ?Living Arrangements: Alone ?Available Help at Discharge: Family;Available PRN/intermittently ?Type of Home: Mobile home ?Home Access: Stairs to enter ?Entrance Stairs-Rails: Right;Left ?Entrance Stairs-Number of Steps: 5 ?  ?Home Layout: One level ?Home Equipment: Conservation officer, nature (2 wheels);Wheelchair - manual ?   ?  ?Prior Function Prior Level of Function : Independent/Modified Independent;History of Falls (last six months) ?  ?  ?  ?  ?  ?  ?Mobility Comments: pt was independent with mobility using walker until several weeks ago ?  ?  ? ? ?Hand Dominance  ?   ? ?  ?Extremity/Trunk Assessment  ? Upper Extremity Assessment ?Upper Extremity Assessment: Generalized weakness (grossly 3+) ?  ? ?Lower Extremity Assessment ?Lower Extremity Assessment: RLE deficits/detail;LLE deficits/detail ?RLE Deficits / Details: AAROM WFL; hip flexion 2+ (pain), hip extension (bridging 2+) knee extension3+, ankle DF 4 ?LLE Deficits / Details: AROM WFL; hip flexion 2+, hip extension (bridging 2+), knee extension 3+, ankle DF 4 ?  ? ?Cervical / Trunk Assessment ?Cervical / Trunk Assessment: Kyphotic  ?Communication  ? Communication: HOH  ?Cognition Arousal/Alertness: Awake/alert ?Behavior During Therapy: Anxious ?Overall Cognitive Status: No family/caregiver present to determine baseline cognitive functioning ?  ?  ?  ?  ?  ?  ?  ?  ?  ?  ?  ?  ?  ?  ?  ?  ?General Comments: oriented to self, DOB, location; following single step instructions ?  ?  ? ?  ?General Comments   ? ?  ?Exercises    ? ?Assessment/Plan  ?  ?PT Assessment Patient needs continued PT services  ?PT Problem List Decreased strength;Decreased balance;Decreased mobility;Decreased knowledge of use of DME;Decreased safety awareness;Pain ? ?   ?  ?PT  Treatment Interventions DME instruction;Gait training;Functional mobility training;Therapeutic activities;Therapeutic exercise;Balance training;Patient/family education   ? ?PT Goals (Current goals can be found in the Care Plan section)  ?Acute Rehab PT Goals ?Patient Stated Goal: to get stronger and not fall again ?PT Goal Formulation: With patient ?Time For Goal Achievement: 11/18/21 ?Potential to Achieve Goals: Good ? ?  ?Frequency Min 2X/week ?  ? ? ?Co-evaluation   ?  ?  ?  ?  ? ? ?  ?AM-PAC PT "6 Clicks" Mobility  ?Outcome Measure Help needed turning from your back to your side while in a flat bed without using bedrails?: A Little ?Help needed moving from lying on your back to sitting on the side of a flat bed without using bedrails?: A Lot ?Help needed moving to and from a bed to a chair (including a wheelchair)?: A Lot ?Help needed standing up from a chair using your arms (e.g., wheelchair or bedside chair)?: A Lot ?Help needed to walk in hospital room?: A Lot ?Help needed climbing 3-5 steps with a railing? : Total ?6 Click Score: 12 ? ?  ?End of Session   ?Activity Tolerance: Patient  limited by pain ?Patient left: in bed (on ED stretcher in hall) ?Nurse Communication: Mobility status;Other (comment) (need for SNF) ?PT Visit Diagnosis: Muscle weakness (generalized) (M62.81);History of falling (Z91.81);Difficulty in walking, not elsewhere classified (R26.2) ?  ? ?Time: [id number] ?PT Time Calculation (min) (ACUTE ONLY): 20 min ? ? ?Charges:   PT Evaluation ?$PT Eval Low Complexity: 1 Low ?  ?  ?   ? ? ? ? ?Arby Barrette, PT ?Acute Rehabilitation Services  ?Pager [phone] ?Office [phone] ? ? ?Jeanie Cooks Anthoni Geerts ?11/04/2021, 2:52 PM ?

## 2021-11-04 NOTE — ED Notes (Addendum)
RN spoke with patient's only family member in area, [name] (niece) about coming to [facility] ED to pick patient up for discharge. Family member expressed great concern over patient being discharged again (second visit to the ED for weakness/falls in the last 24 hours), continuing to state that patient lives alone and that she is unable to stay and assist her with ADLs. Previous to this week pt was ambulatory, but now continues to have weakness/mobility issues that have resulted in multiple falls and limited ability to care for herself. Family member expressed concern that if patient is discharged again back home she will fall again and could injure herself worse as the weakness that is causing for to fall persists.  ? ?RN reported concern about unsafe discharge conditions to CN and to MD [place]. ?

## 2021-11-04 NOTE — ED Notes (Signed)
PT at [facility], recommending SNF placement.  ?

## 2021-11-04 NOTE — ED Notes (Signed)
[place], son, updated ?

## 2021-11-05 ENCOUNTER — Encounter (HOSPITAL_COMMUNITY): Payer: Self-pay

## 2021-11-05 LAB — URINE CULTURE: Culture: >=100000 — AB

## 2021-11-05 NOTE — ED Notes (Signed)
Patient X2 assist to bathroom. Patient had BM. Provided with perineal care and bed linen change.  ?

## 2021-11-05 NOTE — ED Notes (Signed)
Pt niece [name] called for an update. RN informed [name] awaiting callback from facility.  ?

## 2021-11-05 NOTE — ED Notes (Signed)
Two NT assisted pt to restroom. ?

## 2021-11-05 NOTE — ED Notes (Signed)
RN setup pt lunch tray 

## 2021-11-05 NOTE — TOC Progression Note (Signed)
Transition of Care (TOC) - Progression Note  ? ? ?Patient Details  ?Name: Alicia Schwartz ?MRN: [id number] ?Date of Birth: 04/07/1931 ? ?Transition of Care (TOC) CM/SW Contact  ?Patrice Paradise, LCSW ?Phone Number:[phone] ?11/05/2021, 10:17 AM ? ?Clinical Narrative:    ? ?CSW attempted to start [facility] authorization through portal however was unable to do so. CSW called [employer] to verify and they confirmed that pt is managed through regular [employer]. CSW called and spoke with Olegario Messier at [facility] and informed her that facility would need to start authorization. Olegario Messier stated that authorization would be started tomorrow. ? ?TOC team will continue to assist with discharge planning needs.  ?  ?Barriers to Discharge: Continued Medical Work up ? ?Expected Discharge Plan and Services ?  ?  ?  ?  ?Living arrangements for the past 2 months: Mobile Home ?                ?  ?  ?  ?  ?  ?  ?  ?  ?  ?  ? ? ?Social Determinants of Health (SDOH) Interventions ?  ? ?Readmission Risk Interventions ?   ? View : No data to display.  ?  ?  ?  ? ? ?

## 2021-11-05 NOTE — ED Notes (Signed)
Breakfast order placed ?

## 2021-11-05 NOTE — Progress Notes (Signed)
CSW notes no current bed offers. Discussed with Alicia Schwartz at [facility] awaiting to hear back.  ?

## 2021-11-05 NOTE — ED Notes (Signed)
Pt eating dinner tray. RN setup tray. ?

## 2021-11-05 NOTE — ED Notes (Signed)
RN setup breakfast tray for pt. ?

## 2021-11-05 NOTE — ED Notes (Signed)
RN attempted to call son no answer. ?

## 2021-11-05 NOTE — ED Notes (Signed)
Pt is sleep at the bedside.  ?

## 2021-11-06 MED ORDER — LOSARTAN POTASSIUM 50 MG PO TABS
50.0000 mg | ORAL_TABLET | Freq: Once | ORAL | Status: AC
  Administered 2021-11-06: 50 mg via ORAL
  Filled 2021-11-06: qty 1

## 2021-11-06 MED ORDER — CALCIUM CARBONATE ANTACID 500 MG PO CHEW
1.0000 | CHEWABLE_TABLET | Freq: Four times a day (QID) | ORAL | Status: DC | PRN
  Administered 2021-11-06: 400 mg via ORAL
  Filled 2021-11-06 (×2): qty 2

## 2021-11-06 MED ORDER — CEPHALEXIN 250 MG PO CAPS
500.0000 mg | ORAL_CAPSULE | Freq: Two times a day (BID) | ORAL | Status: DC
Start: 2021-11-06 — End: 2021-11-08
  Administered 2021-11-06 – 2021-11-07 (×2): 500 mg via ORAL
  Filled 2021-11-06 (×2): qty 2

## 2021-11-06 NOTE — ED Notes (Signed)
Dr.Trifan notified of pt bp of 219/65. Pt took bp meds this AM. Hx of HPN. Waiting for orders. Denies headache, dizziness or any other discomfort. Will continue to monitor.  ?

## 2021-11-06 NOTE — ED Notes (Signed)
Cleaned and changed into dry linen and diaper by staff at this time.  ?

## 2021-11-06 NOTE — ED Notes (Signed)
RN helped pt eat. Ate 70% of dinner tray with assistance.  ?

## 2021-11-06 NOTE — Progress Notes (Signed)
CSW contacted Olegario Messier at [facility] to follow up on insurance auth. CSW is awaiting a call back ?

## 2021-11-06 NOTE — Progress Notes (Signed)
[facility]) Hospital Liaison note: ? ?This patient is currently enrolled in [facility] outpatient-based Palliative Care. Will continue to follow for disposition. ? ?Please call with any outpatient palliative questions or concerns. ? ?Thank you, ?Abran Cantor, LPN ?[facility] Hospital Liaison ?[phone] ?

## 2021-11-07 DIAGNOSIS — R5381 Other malaise: Secondary | ICD-10-CM | POA: Diagnosis not present

## 2021-11-07 DIAGNOSIS — E46 Unspecified protein-calorie malnutrition: Secondary | ICD-10-CM | POA: Diagnosis not present

## 2021-11-07 DIAGNOSIS — S8011XA Contusion of right lower leg, initial encounter: Secondary | ICD-10-CM | POA: Diagnosis not present

## 2021-11-07 DIAGNOSIS — D638 Anemia in other chronic diseases classified elsewhere: Secondary | ICD-10-CM | POA: Diagnosis not present

## 2021-11-07 DIAGNOSIS — F411 Generalized anxiety disorder: Secondary | ICD-10-CM | POA: Diagnosis not present

## 2021-11-07 DIAGNOSIS — E871 Hypo-osmolality and hyponatremia: Secondary | ICD-10-CM | POA: Diagnosis not present

## 2021-11-07 DIAGNOSIS — S8012XA Contusion of left lower leg, initial encounter: Secondary | ICD-10-CM | POA: Diagnosis not present

## 2021-11-07 DIAGNOSIS — M549 Dorsalgia, unspecified: Secondary | ICD-10-CM | POA: Diagnosis not present

## 2021-11-07 DIAGNOSIS — S22088S Other fracture of T11-T12 vertebra, sequela: Secondary | ICD-10-CM | POA: Diagnosis not present

## 2021-11-07 DIAGNOSIS — R531 Weakness: Secondary | ICD-10-CM | POA: Diagnosis not present

## 2021-11-07 DIAGNOSIS — J069 Acute upper respiratory infection, unspecified: Secondary | ICD-10-CM | POA: Diagnosis not present

## 2021-11-07 DIAGNOSIS — Z7401 Bed confinement status: Secondary | ICD-10-CM | POA: Diagnosis not present

## 2021-11-07 DIAGNOSIS — D72829 Elevated white blood cell count, unspecified: Secondary | ICD-10-CM | POA: Diagnosis not present

## 2021-11-07 DIAGNOSIS — S22089D Unspecified fracture of T11-T12 vertebra, subsequent encounter for fracture with routine healing: Secondary | ICD-10-CM | POA: Diagnosis not present

## 2021-11-07 DIAGNOSIS — R262 Difficulty in walking, not elsewhere classified: Secondary | ICD-10-CM | POA: Diagnosis not present

## 2021-11-07 DIAGNOSIS — R0989 Other specified symptoms and signs involving the circulatory and respiratory systems: Secondary | ICD-10-CM | POA: Diagnosis not present

## 2021-11-07 DIAGNOSIS — R2689 Other abnormalities of gait and mobility: Secondary | ICD-10-CM | POA: Diagnosis not present

## 2021-11-07 DIAGNOSIS — M5030 Other cervical disc degeneration, unspecified cervical region: Secondary | ICD-10-CM | POA: Diagnosis not present

## 2021-11-07 DIAGNOSIS — Z515 Encounter for palliative care: Secondary | ICD-10-CM | POA: Diagnosis not present

## 2021-11-07 DIAGNOSIS — N1832 Chronic kidney disease, stage 3b: Secondary | ICD-10-CM | POA: Diagnosis not present

## 2021-11-07 DIAGNOSIS — M6281 Muscle weakness (generalized): Secondary | ICD-10-CM | POA: Diagnosis not present

## 2021-11-07 DIAGNOSIS — E860 Dehydration: Secondary | ICD-10-CM | POA: Diagnosis not present

## 2021-11-07 DIAGNOSIS — R051 Acute cough: Secondary | ICD-10-CM | POA: Diagnosis not present

## 2021-11-07 DIAGNOSIS — R059 Cough, unspecified: Secondary | ICD-10-CM | POA: Diagnosis not present

## 2021-11-07 DIAGNOSIS — F331 Major depressive disorder, recurrent, moderate: Secondary | ICD-10-CM | POA: Diagnosis not present

## 2021-11-07 DIAGNOSIS — I10 Essential (primary) hypertension: Secondary | ICD-10-CM | POA: Diagnosis not present

## 2021-11-07 DIAGNOSIS — Z9181 History of falling: Secondary | ICD-10-CM | POA: Diagnosis not present

## 2021-11-07 DIAGNOSIS — F419 Anxiety disorder, unspecified: Secondary | ICD-10-CM | POA: Diagnosis not present

## 2021-11-07 DIAGNOSIS — G8929 Other chronic pain: Secondary | ICD-10-CM | POA: Diagnosis not present

## 2021-11-07 DIAGNOSIS — I471 Supraventricular tachycardia: Secondary | ICD-10-CM | POA: Diagnosis not present

## 2021-11-07 NOTE — ED Notes (Signed)
Patient awaiting transport to [facility] ?

## 2021-11-07 NOTE — ED Notes (Signed)
Continue to await PTAR for transport to [facility] ?

## 2021-11-07 NOTE — ED Notes (Signed)
Dinner brought to room. ?

## 2021-11-07 NOTE — Progress Notes (Signed)
Patient is going to room 127 at [facility]. Number for report is [phone].  ?

## 2021-11-07 NOTE — ED Notes (Signed)
Patient discharged via PTAR for transport to [facility]. AVS and medical necessity printed and given to crew. Patient in possession of all belongings.   ?

## 2021-11-07 NOTE — ED Notes (Signed)
Patient is fourth on the list for PTAR ?

## 2021-11-07 NOTE — ED Notes (Signed)
Attempted to call [facility], was on hold and transferred x 2 with no answer ?

## 2021-11-07 NOTE — ED Notes (Signed)
Report called Mysha, RN at [facility] ?

## 2021-11-07 NOTE — ED Notes (Signed)
Breakfast order placed ?

## 2021-11-07 NOTE — ED Notes (Signed)
Still unable to get in touch with anyone at the facility to give report ?

## 2021-11-07 NOTE — Progress Notes (Signed)
Physical Therapy Treatment ?Patient Details ?Name: Alicia Schwartz ?MRN: [id number] ?DOB: Aug 28, 1930 ?Today's Date: 11/07/2021 ? ? ?History of Present Illness 88 year old female presents with worsening lower extremity weakness.  Patient states that she has had this for several weeks.  She does use a walker or wheelchair at home.  Earlier in week patient had episode of left arm and hand tingling which lasted for approximate 2 hours. Head CT negative for acute findings; discharged from ED 11/03/21 and returned the same day s/p fall. CT head and c-spine negative ? ?  ?PT Comments  ? ? Patient is progressing towards goals with skilled PT interventions performed today including transfer and gait training and some bed level therapeutic exercises. Patient required min guard to mod A for bed mobility, and min Ax2 for transfers and steps in hospital room using RW. Pt very anxious with mobility. Pt had progressive LE shakiness during ambulation. Pt educated on therex for LE ROM and strength with limited carryover requiring tactile cues to perform. PT continues to recommend SNF upon D/C to address mobility deficits. Pt will benefit from continued skilled PT to maximize pt's safety and independence with functional mobility.  ?   ?Recommendations for follow up therapy are one component of a multi-disciplinary discharge planning process, led by the attending physician.  Recommendations may be updated based on patient status, additional functional criteria and insurance authorization. ? ?Follow Up Recommendations ? Skilled nursing-short term rehab (<3 hours/day) ?  ?  ?Assistance Recommended at Discharge Frequent or constant Supervision/Assistance  ?Patient can return home with the following A lot of help with walking and/or transfers;A lot of help with bathing/dressing/bathroom;Assistance with cooking/housework;Assist for transportation;Help with stairs or ramp for entrance ?  ?Equipment Recommendations ? None recommended by PT  ?   ?Recommendations for Other Services   ? ? ?  ?Precautions / Restrictions Precautions ?Precautions: Fall ?Precaution Comments: fell 4/14 after discharge home from ED ?Restrictions ?Weight Bearing Restrictions: No  ?  ? ?Mobility ? Bed Mobility ?Overal bed mobility: Needs Assistance ?Bed Mobility: Supine to Sit, Sit to Supine ?  ?  ?Supine to sit: Min guard, HOB elevated ?Sit to supine: Mod assist ?  ?General bed mobility comments: pt on hospital bed in ED with HOB elevated. Pt requiring min guard for sitting at EOB with use of hand rails. Pt required cues to sit upright for postural control. Pt in slouched posture and upon cueing, pt able to correct to upright seated posture for frief periods before fatigue; pt returned to slouched posture. At end of session, pt requiring mod A for LE management to return to supine. ?  ? ?Transfers ?Overall transfer level: Needs assistance ?Equipment used: Rolling walker (2 wheels) (bring youth sided walker) ?Transfers: Sit to/from Stand ?Sit to Stand: Min assist, +2 physical assistance ?  ?  ?  ?  ?  ?General transfer comment: pt requiring min Ax2 for power up while using RW. Pt needed cues for hand placement and to obtain an upright posture instead of extreme forward trunk flexion. ?  ? ?Ambulation/Gait ?Ambulation/Gait assistance: Min assist, +2 physical assistance ?Gait Distance (Feet): 2 Feet ?Assistive device: Rolling walker (2 wheels) ?Gait Pattern/deviations: Step-to pattern, Trunk flexed, Narrow base of support ?Gait velocity: decreased ?Gait velocity interpretation: <1.8 ft/sec, indicate of risk for recurrent falls ?  ?General Gait Details: pt required min Ax2 for small steps forward and backward in room. Pt with progressive LE shakiness with stepping. Pt required to sit down due to weakness and  fatigue. Pt required cues for hand placement and backing up to surface before attempting to sit down. ? ? ?Stairs ?  ?  ?  ?  ?  ? ? ?Wheelchair Mobility ?  ? ?Modified Rankin  (Stroke Patients Only) ?  ? ? ?  ?Balance Overall balance assessment: Needs assistance ?Sitting-balance support: Single extremity supported, Feet unsupported ?Sitting balance-Leahy Scale: Poor ?Sitting balance - Comments: required min guard A in sititng for safety and steadying. forward flexed posture requiring cues to correct. Pt able to don gown on her back with SUE support in sitting. ?  ?Standing balance support: During functional activity, Bilateral upper extremity supported, Reliant on assistive device for balance ?Standing balance-Leahy Scale: Poor ?Standing balance comment: required min Ax2 for standing while using RW for UE support ?  ?  ?  ?  ?  ?  ?  ?  ?  ?  ?  ?  ? ?  ?Cognition Arousal/Alertness: Awake/alert ?Behavior During Therapy: Anxious ?Overall Cognitive Status: No family/caregiver present to determine baseline cognitive functioning ?  ?  ?  ?  ?  ?  ?  ?  ?  ?  ?  ?  ?  ?  ?  ?  ?General Comments: pt very anxious with mobility tasks; very HOH ?  ?  ? ?  ?Exercises General Exercises - Lower Extremity ?Heel Slides: 10 reps, Both, Supine ?Hip ABduction/ADduction: 10 reps, Both, Supine ?Hip Flexion/Marching: Both, 10 reps, Seated ? ?  ?General Comments   ?  ?  ? ?Pertinent Vitals/Pain Pain Assessment ?Pain Assessment: Faces ?Faces Pain Scale: Hurts little more ?Pain Location: legs ?Pain Descriptors / Indicators: Discomfort ?Pain Intervention(s): Limited activity within patient's tolerance  ? ? ?Home Living   ?  ?  ?  ?  ?  ?  ?  ?  ?  ?   ?  ?Prior Function    ?  ?  ?   ? ?PT Goals (current goals can now be found in the care plan section) Acute Rehab PT Goals ?Patient Stated Goal: to get stronger and not fall again ?PT Goal Formulation: With patient ?Time For Goal Achievement: 11/18/21 ?Potential to Achieve Goals: Good ?Progress towards PT goals: Progressing toward goals ? ?  ?Frequency ? ? ? Min 2X/week ? ? ? ?  ?PT Plan Current plan remains appropriate  ? ? ?Co-evaluation   ?  ?  ?  ?  ? ?   ?AM-PAC PT "6 Clicks" Mobility   ?Outcome Measure ? Help needed turning from your back to your side while in a flat bed without using bedrails?: A Little ?Help needed moving from lying on your back to sitting on the side of a flat bed without using bedrails?: A Little ?Help needed moving to and from a bed to a chair (including a wheelchair)?: Total ?Help needed standing up from a chair using your arms (e.g., wheelchair or bedside chair)?: Total ?Help needed to walk in hospital room?: Total ?Help needed climbing 3-5 steps with a railing? : Total ?6 Click Score: 10 ? ?  ?End of Session Equipment Utilized During Treatment: Gait belt ?Activity Tolerance: Patient limited by fatigue ?Patient left: in bed;Other (comment) (in ED psych hold area with door open) ?Nurse Communication: Mobility status ?PT Visit Diagnosis: Muscle weakness (generalized) (M62.81);History of falling (Z91.81);Difficulty in walking, not elsewhere classified (R26.2) ?  ? ? ?Time: [id number] ?PT Time Calculation (min) (ACUTE ONLY): 26 min ? ?Charges:  $Therapeutic Activity: 23-37 mins          ?          ? ?  Jonne Ply, SPT ? ? ?Jonne Ply ?11/07/2021, 1:01 PM ? ? ? ?

## 2021-11-08 DIAGNOSIS — E46 Unspecified protein-calorie malnutrition: Secondary | ICD-10-CM | POA: Diagnosis not present

## 2021-11-08 DIAGNOSIS — R2689 Other abnormalities of gait and mobility: Secondary | ICD-10-CM | POA: Diagnosis not present

## 2021-11-08 DIAGNOSIS — R262 Difficulty in walking, not elsewhere classified: Secondary | ICD-10-CM | POA: Diagnosis not present

## 2021-11-08 DIAGNOSIS — D638 Anemia in other chronic diseases classified elsewhere: Secondary | ICD-10-CM | POA: Diagnosis not present

## 2021-11-08 DIAGNOSIS — F419 Anxiety disorder, unspecified: Secondary | ICD-10-CM | POA: Diagnosis not present

## 2021-11-08 DIAGNOSIS — I10 Essential (primary) hypertension: Secondary | ICD-10-CM | POA: Diagnosis not present

## 2021-11-08 DIAGNOSIS — M6281 Muscle weakness (generalized): Secondary | ICD-10-CM | POA: Diagnosis not present

## 2021-11-08 DIAGNOSIS — M5030 Other cervical disc degeneration, unspecified cervical region: Secondary | ICD-10-CM | POA: Diagnosis not present

## 2021-11-08 DIAGNOSIS — Z9181 History of falling: Secondary | ICD-10-CM | POA: Diagnosis not present

## 2021-11-09 DIAGNOSIS — R2689 Other abnormalities of gait and mobility: Secondary | ICD-10-CM | POA: Diagnosis not present

## 2021-11-09 DIAGNOSIS — I10 Essential (primary) hypertension: Secondary | ICD-10-CM | POA: Diagnosis not present

## 2021-11-09 DIAGNOSIS — I471 Supraventricular tachycardia: Secondary | ICD-10-CM | POA: Diagnosis not present

## 2021-11-09 DIAGNOSIS — D638 Anemia in other chronic diseases classified elsewhere: Secondary | ICD-10-CM | POA: Diagnosis not present

## 2021-11-09 DIAGNOSIS — F419 Anxiety disorder, unspecified: Secondary | ICD-10-CM | POA: Diagnosis not present

## 2021-11-09 DIAGNOSIS — M6281 Muscle weakness (generalized): Secondary | ICD-10-CM | POA: Diagnosis not present

## 2021-11-09 DIAGNOSIS — M5030 Other cervical disc degeneration, unspecified cervical region: Secondary | ICD-10-CM | POA: Diagnosis not present

## 2021-11-09 DIAGNOSIS — E46 Unspecified protein-calorie malnutrition: Secondary | ICD-10-CM | POA: Diagnosis not present

## 2021-11-09 DIAGNOSIS — Z9181 History of falling: Secondary | ICD-10-CM | POA: Diagnosis not present

## 2021-11-10 DIAGNOSIS — S22089D Unspecified fracture of T11-T12 vertebra, subsequent encounter for fracture with routine healing: Secondary | ICD-10-CM | POA: Diagnosis not present

## 2021-11-10 DIAGNOSIS — M5030 Other cervical disc degeneration, unspecified cervical region: Secondary | ICD-10-CM | POA: Diagnosis not present

## 2021-11-10 DIAGNOSIS — R262 Difficulty in walking, not elsewhere classified: Secondary | ICD-10-CM | POA: Diagnosis not present

## 2021-11-10 DIAGNOSIS — D638 Anemia in other chronic diseases classified elsewhere: Secondary | ICD-10-CM | POA: Diagnosis not present

## 2021-11-10 DIAGNOSIS — F419 Anxiety disorder, unspecified: Secondary | ICD-10-CM | POA: Diagnosis not present

## 2021-11-10 DIAGNOSIS — I471 Supraventricular tachycardia: Secondary | ICD-10-CM | POA: Diagnosis not present

## 2021-11-10 DIAGNOSIS — I10 Essential (primary) hypertension: Secondary | ICD-10-CM | POA: Diagnosis not present

## 2021-11-10 DIAGNOSIS — R2689 Other abnormalities of gait and mobility: Secondary | ICD-10-CM | POA: Diagnosis not present

## 2021-11-10 DIAGNOSIS — E46 Unspecified protein-calorie malnutrition: Secondary | ICD-10-CM | POA: Diagnosis not present

## 2021-11-13 DIAGNOSIS — E46 Unspecified protein-calorie malnutrition: Secondary | ICD-10-CM | POA: Diagnosis not present

## 2021-11-13 DIAGNOSIS — M5030 Other cervical disc degeneration, unspecified cervical region: Secondary | ICD-10-CM | POA: Diagnosis not present

## 2021-11-13 DIAGNOSIS — Z9181 History of falling: Secondary | ICD-10-CM | POA: Diagnosis not present

## 2021-11-13 DIAGNOSIS — D638 Anemia in other chronic diseases classified elsewhere: Secondary | ICD-10-CM | POA: Diagnosis not present

## 2021-11-13 DIAGNOSIS — I10 Essential (primary) hypertension: Secondary | ICD-10-CM | POA: Diagnosis not present

## 2021-11-13 DIAGNOSIS — I471 Supraventricular tachycardia: Secondary | ICD-10-CM | POA: Diagnosis not present

## 2021-11-13 DIAGNOSIS — R2689 Other abnormalities of gait and mobility: Secondary | ICD-10-CM | POA: Diagnosis not present

## 2021-11-13 DIAGNOSIS — M6281 Muscle weakness (generalized): Secondary | ICD-10-CM | POA: Diagnosis not present

## 2021-11-13 DIAGNOSIS — F419 Anxiety disorder, unspecified: Secondary | ICD-10-CM | POA: Diagnosis not present

## 2021-11-14 DIAGNOSIS — I471 Supraventricular tachycardia: Secondary | ICD-10-CM | POA: Diagnosis not present

## 2021-11-14 DIAGNOSIS — M6281 Muscle weakness (generalized): Secondary | ICD-10-CM | POA: Diagnosis not present

## 2021-11-14 DIAGNOSIS — M5030 Other cervical disc degeneration, unspecified cervical region: Secondary | ICD-10-CM | POA: Diagnosis not present

## 2021-11-14 DIAGNOSIS — I10 Essential (primary) hypertension: Secondary | ICD-10-CM | POA: Diagnosis not present

## 2021-11-28 ENCOUNTER — Non-Acute Institutional Stay: Payer: BC Managed Care – PPO | Admitting: Hospice

## 2021-11-28 DIAGNOSIS — G8929 Other chronic pain: Secondary | ICD-10-CM | POA: Diagnosis not present

## 2021-11-28 DIAGNOSIS — S22088S Other fracture of T11-T12 vertebra, sequela: Secondary | ICD-10-CM | POA: Diagnosis not present

## 2021-11-28 DIAGNOSIS — M549 Dorsalgia, unspecified: Secondary | ICD-10-CM | POA: Diagnosis not present

## 2021-11-28 DIAGNOSIS — Z515 Encounter for palliative care: Secondary | ICD-10-CM | POA: Diagnosis not present

## 2021-11-28 NOTE — Progress Notes (Addendum)
? ? ?Manufacturing engineer ?Community Palliative Care Consult Note ?Telephone: [phone]  ?Fax: [phone] ? ?PATIENT NAME: Alicia Schwartz ?[place] [address] ?[phone] (home)  ?DOB: [birth year] ?MRN: [id number] ? ?PRIMARY CARE PROVIDER:    ?Tamsen Roers, MD,  ?[place]  [address] ?[phone] ? ?REFERRING PROVIDER:   ?Salem Senate, NP ? ?RESPONSIBLE PARTY:    ?Contact Information   ? ? Name Relation Home Work Mobile  ? Ratcliff,[name] Niece [phone]  [phone]  ? Alane, Hanssen Son   [phone]  ? ?  ? ? ?I met face to face with patient at facility. Visit to build trust and highlight Palliative Medicine as specialized medical care for people living with serious illness, aimed at facilitating better quality of life through symptoms relief, assisting with advance care planning and complex medical decision making.  NP called [name] and left her a voicemail with callback number. ?ASSESSMENT AND / RECOMMENDATIONS:  ? ?CODE STATUS: Patient is a DO NOT RESUSCITATE.   ? ?Goals of Care: Goals include to maximize quality of life and symptom management ? ?Symptom Management/Plan: ?Gait disturbance: With recurrent falls, last fall 11/03/21 for which she was sent to the ED; discharged to SNF for acute rehab. PT/OT is ongoing for strengthening and gait training.  ?Back pain: related to closed fracture of 12th thoracic vertebra.  Continue hydrocodone as ordered.  Continue PT/OT. ?Constipation: Managed with Miralax and Bisacodyl suppository.  ? ?Follow up: Palliative care will continue to follow for complex medical decision making, advance care planning, and clarification of goals. Return 6 weeks or prn. Encouraged to call provider sooner with any concerns.  ? ?Family /Caregiver/Community Supports: Patient in SNF for ongoing care ? ?HOSPICE ELIGIBILITY/DIAGNOSIS: TBD ? ?Chief Complaint: Palliative care visit ? ?HISTORY OF PRESENT ILLNESS:  Alicia Schwartz is a 88  y.o. year old female  with multiple morbidities requiring close monitoring and with high risk of complications and  mortality: Gait disturbance with recurrent falls, back pain related to closed fracture of 12th thoracic vertebrae.  History of CKD3b, htn, hyperlipidemia, cholelithiasis, hyperglycemia, osteoporosis, right femur fx 01/2015 s/p IM nail, T12 compression fx 12/20 s/p vertebroplasty.  ?History obtained from review of EMR, discussion with primary team, caregiver, family and/or Ms. Aguado.  ?Review and summarization of Epic records shows history from other than patient. Rest of 10 point ROS asked and negative. I reviewed as needed, available labs, patient records, imaging, studies and related documents from the EMR. ? ? ? ?Physical Exam: ?Constitutional: NAD ?General: Well groomed, cooperative, hard of hearing ?EYES: anicteric sclera, lids intact, no discharge  ?ENMT: Moist mucous membrane ?CV: S1 S2, RRR, no LE edema ?Pulmonary: LCTA, no increased work of breathing, no cough, ?Abdomen: active BS + 4 quadrants, soft and non tender ?GU: no suprapubic tenderness ?MSK: weakness, limited ROM, ambulatory with assistive device/supervision ?Skin: warm and dry, no rashes or wounds on visible skin ?Neuro:  weakness, otherwise non focal ?Psych: non-anxious affect ?Hem/lymph/immuno: no widespread bruising ? ? ?PAST MEDICAL HISTORY:  ?Active Ambulatory Problems  ?  Diagnosis Date Noted  ? Anxiety state 09/19/2006  ? HYPERTENSION, BENIGN SYSTEMIC 09/19/2006  ? Fracture, subtrochanteric, right femur, closed ([facility]) 02/11/2015  ? Fall due to stumbling 02/11/2015  ? Paroxysmal supraventricular tachycardia ([facility]) 02/12/2015  ? Postoperative anemia due to acute blood loss 02/14/2015  ? Closed T12 fracture ([facility]) 07/04/2019  ? Hypertensive urgency 07/04/2019  ? Back pain 07/04/2019  ? Pressure injury of skin 07/04/2019  ?  Hyponatremia 07/11/2019  ? ?Resolved Ambulatory Problems  ?  Diagnosis Date Noted  ? Mid back pain 07/04/2019   ? ?Past Medical History:  ?Diagnosis Date  ? Hypertension   ? ? ?SOCIAL HX:  ?Social History  ? ?Tobacco Use  ? Smoking status: Never  ? Smokeless tobacco: Never  ?Substance Use Topics  ? Alcohol use: No  ? ?  ?FAMILY HX:  ?Family History  ?Problem Relation Age of Onset  ? Alzheimer's disease Mother   ? Prostate cancer Father   ?   ? ?ALLERGIES: No Known Allergies   ? ?PERTINENT MEDICATIONS:  ?Outpatient Encounter Medications as of 11/28/2021  ?Medication Sig  ? acetaminophen (TYLENOL) 325 MG tablet Take 2 tablets (650 mg total) by mouth every 6 (six) hours as needed for mild pain (or Fever >/= 101).  ? amLODipine (NORVASC) 10 MG tablet Take 1 tablet (10 mg total) by mouth daily. (Patient not taking: Reported on 11/02/2021)  ? bisacodyl (DULCOLAX) 10 MG suppository Place 1 suppository (10 mg total) rectally daily as needed for moderate constipation. (Patient not taking: Reported on 06/28/2019)  ? CALCIUM PO Take 1 tablet by mouth daily.  ? docusate sodium (COLACE) 100 MG capsule Take 1 capsule (100 mg total) by mouth 2 (two) times daily. (Patient not taking: Reported on 11/04/2021)  ? ferrous sulfate 325 (65 FE) MG tablet Take 1 tablet (325 mg total) by mouth 3 (three) times daily after meals. (Patient not taking: Reported on 06/28/2019)  ? folic acid (FOLVITE) 1 MG tablet Take 1 mg by mouth daily. (Patient not taking: Reported on 11/04/2021)  ? HYDROcodone-acetaminophen (NORCO) 5-325 MG tablet Take 1-2 tablets by mouth every 6 (six) hours as needed for moderate pain (1 tab for moderate pain and 2 tabs for severe pain PRN). MAXIMUM TOTAL ACETAMINOPHEN DOSE IS 4000 MG PER DAY (Patient not taking: Reported on 11/04/2021)  ? ibuprofen (ADVIL) 200 MG tablet Take 1 tablet (200 mg total) by mouth every 6 (six) hours as needed for mild pain.  ? lisinopril (ZESTRIL) 20 MG tablet Take 1 tablet (20 mg total) by mouth daily. (Patient not taking: Reported on 11/02/2021)  ? losartan-hydrochlorothiazide (HYZAAR) 50-12.5 MG tablet Take 1  tablet by mouth daily.  ? metoprolol succinate (TOPROL-XL) 50 MG 24 hr tablet Take 50 mg by mouth daily.  ? Multiple Vitamin (MULTIVITAMIN) tablet Take 1 tablet by mouth daily.  ? ondansetron (ZOFRAN) 4 MG tablet Take 1 tablet (4 mg total) by mouth every 6 (six) hours as needed for nausea. (Patient not taking: Reported on 11/04/2021)  ? polyethylene glycol (MIRALAX / GLYCOLAX) packet Take 17 g by mouth daily. (Patient not taking: Reported on 06/28/2019)  ? senna (SENOKOT) 8.6 MG TABS tablet Take 1 tablet (8.6 mg total) by mouth 2 (two) times daily as needed for mild constipation. (Patient not taking: Reported on 11/04/2021)  ? spironolactone (ALDACTONE) 25 MG tablet Take 25 mg by mouth at bedtime.  ? ?No facility-administered encounter medications on file as of 11/28/2021.  ? ?I spent 50 minutes providing this consultation; this includes time spent with patient/family, chart review and documentation. More than 50% of the time in this consultation was spent on counseling and coordinating communication  ? ?Thank you for the opportunity to participate in the care of Ms. Mcloughlin.  The palliative care team will continue to follow. Please call our office at [phone] if we can be of additional assistance.  ? ?Note: Portions of this note were generated with Dragon dictation  software. Dictation errors may occur despite best attempts at proofreading. ? ?Teodoro Spray, NP  ? ?  ?

## 2021-11-30 DIAGNOSIS — S22089D Unspecified fracture of T11-T12 vertebra, subsequent encounter for fracture with routine healing: Secondary | ICD-10-CM | POA: Diagnosis not present

## 2021-11-30 DIAGNOSIS — M5030 Other cervical disc degeneration, unspecified cervical region: Secondary | ICD-10-CM | POA: Diagnosis not present

## 2021-11-30 DIAGNOSIS — D638 Anemia in other chronic diseases classified elsewhere: Secondary | ICD-10-CM | POA: Diagnosis not present

## 2021-11-30 DIAGNOSIS — E46 Unspecified protein-calorie malnutrition: Secondary | ICD-10-CM | POA: Diagnosis not present

## 2021-11-30 DIAGNOSIS — I10 Essential (primary) hypertension: Secondary | ICD-10-CM | POA: Diagnosis not present

## 2021-11-30 DIAGNOSIS — I471 Supraventricular tachycardia: Secondary | ICD-10-CM | POA: Diagnosis not present

## 2021-11-30 DIAGNOSIS — R262 Difficulty in walking, not elsewhere classified: Secondary | ICD-10-CM | POA: Diagnosis not present

## 2021-11-30 DIAGNOSIS — R2689 Other abnormalities of gait and mobility: Secondary | ICD-10-CM | POA: Diagnosis not present

## 2021-11-30 DIAGNOSIS — F419 Anxiety disorder, unspecified: Secondary | ICD-10-CM | POA: Diagnosis not present

## 2021-12-04 DIAGNOSIS — R051 Acute cough: Secondary | ICD-10-CM | POA: Diagnosis not present

## 2021-12-04 DIAGNOSIS — F419 Anxiety disorder, unspecified: Secondary | ICD-10-CM | POA: Diagnosis not present

## 2021-12-04 DIAGNOSIS — R5381 Other malaise: Secondary | ICD-10-CM | POA: Diagnosis not present

## 2021-12-04 DIAGNOSIS — M6281 Muscle weakness (generalized): Secondary | ICD-10-CM | POA: Diagnosis not present

## 2021-12-04 DIAGNOSIS — R0989 Other specified symptoms and signs involving the circulatory and respiratory systems: Secondary | ICD-10-CM | POA: Diagnosis not present

## 2021-12-05 DIAGNOSIS — F411 Generalized anxiety disorder: Secondary | ICD-10-CM | POA: Diagnosis not present

## 2021-12-05 DIAGNOSIS — R051 Acute cough: Secondary | ICD-10-CM | POA: Diagnosis not present

## 2021-12-05 DIAGNOSIS — R0989 Other specified symptoms and signs involving the circulatory and respiratory systems: Secondary | ICD-10-CM | POA: Diagnosis not present

## 2021-12-06 DIAGNOSIS — J069 Acute upper respiratory infection, unspecified: Secondary | ICD-10-CM | POA: Diagnosis not present

## 2021-12-06 DIAGNOSIS — E860 Dehydration: Secondary | ICD-10-CM | POA: Diagnosis not present

## 2021-12-06 DIAGNOSIS — E871 Hypo-osmolality and hyponatremia: Secondary | ICD-10-CM | POA: Diagnosis not present

## 2021-12-06 DIAGNOSIS — F411 Generalized anxiety disorder: Secondary | ICD-10-CM | POA: Diagnosis not present

## 2021-12-07 DIAGNOSIS — S22089D Unspecified fracture of T11-T12 vertebra, subsequent encounter for fracture with routine healing: Secondary | ICD-10-CM | POA: Diagnosis not present

## 2021-12-07 DIAGNOSIS — R262 Difficulty in walking, not elsewhere classified: Secondary | ICD-10-CM | POA: Diagnosis not present

## 2021-12-07 DIAGNOSIS — R2689 Other abnormalities of gait and mobility: Secondary | ICD-10-CM | POA: Diagnosis not present

## 2021-12-07 DIAGNOSIS — F419 Anxiety disorder, unspecified: Secondary | ICD-10-CM | POA: Diagnosis not present

## 2021-12-07 DIAGNOSIS — M5030 Other cervical disc degeneration, unspecified cervical region: Secondary | ICD-10-CM | POA: Diagnosis not present

## 2021-12-07 DIAGNOSIS — I10 Essential (primary) hypertension: Secondary | ICD-10-CM | POA: Diagnosis not present

## 2021-12-07 DIAGNOSIS — Z9181 History of falling: Secondary | ICD-10-CM | POA: Diagnosis not present

## 2021-12-07 DIAGNOSIS — M6281 Muscle weakness (generalized): Secondary | ICD-10-CM | POA: Diagnosis not present

## 2021-12-10 DIAGNOSIS — Z1159 Encounter for screening for other viral diseases: Secondary | ICD-10-CM | POA: Diagnosis not present

## 2021-12-10 DIAGNOSIS — M6281 Muscle weakness (generalized): Secondary | ICD-10-CM | POA: Diagnosis not present

## 2021-12-10 DIAGNOSIS — M5030 Other cervical disc degeneration, unspecified cervical region: Secondary | ICD-10-CM | POA: Diagnosis not present

## 2021-12-10 DIAGNOSIS — E46 Unspecified protein-calorie malnutrition: Secondary | ICD-10-CM | POA: Diagnosis not present

## 2021-12-10 DIAGNOSIS — R2689 Other abnormalities of gait and mobility: Secondary | ICD-10-CM | POA: Diagnosis not present

## 2021-12-11 DIAGNOSIS — M6281 Muscle weakness (generalized): Secondary | ICD-10-CM | POA: Diagnosis not present

## 2021-12-11 DIAGNOSIS — Z1159 Encounter for screening for other viral diseases: Secondary | ICD-10-CM | POA: Diagnosis not present

## 2021-12-11 DIAGNOSIS — F411 Generalized anxiety disorder: Secondary | ICD-10-CM | POA: Diagnosis not present

## 2021-12-11 DIAGNOSIS — R2689 Other abnormalities of gait and mobility: Secondary | ICD-10-CM | POA: Diagnosis not present

## 2021-12-11 DIAGNOSIS — M5030 Other cervical disc degeneration, unspecified cervical region: Secondary | ICD-10-CM | POA: Diagnosis not present

## 2021-12-11 DIAGNOSIS — F331 Major depressive disorder, recurrent, moderate: Secondary | ICD-10-CM | POA: Diagnosis not present

## 2021-12-11 DIAGNOSIS — E46 Unspecified protein-calorie malnutrition: Secondary | ICD-10-CM | POA: Diagnosis not present

## 2021-12-12 DIAGNOSIS — M6281 Muscle weakness (generalized): Secondary | ICD-10-CM | POA: Diagnosis not present

## 2021-12-12 DIAGNOSIS — E46 Unspecified protein-calorie malnutrition: Secondary | ICD-10-CM | POA: Diagnosis not present

## 2021-12-12 DIAGNOSIS — Z1159 Encounter for screening for other viral diseases: Secondary | ICD-10-CM | POA: Diagnosis not present

## 2021-12-12 DIAGNOSIS — R2689 Other abnormalities of gait and mobility: Secondary | ICD-10-CM | POA: Diagnosis not present

## 2021-12-12 DIAGNOSIS — M5030 Other cervical disc degeneration, unspecified cervical region: Secondary | ICD-10-CM | POA: Diagnosis not present

## 2021-12-26 ENCOUNTER — Other Ambulatory Visit: Payer: BC Managed Care – PPO

## 2021-12-26 DIAGNOSIS — I129 Hypertensive chronic kidney disease with stage 1 through stage 4 chronic kidney disease, or unspecified chronic kidney disease: Secondary | ICD-10-CM | POA: Diagnosis not present

## 2021-12-26 DIAGNOSIS — N1832 Chronic kidney disease, stage 3b: Secondary | ICD-10-CM | POA: Diagnosis not present

## 2021-12-26 DIAGNOSIS — D631 Anemia in chronic kidney disease: Secondary | ICD-10-CM | POA: Diagnosis not present

## 2021-12-26 DIAGNOSIS — D72829 Elevated white blood cell count, unspecified: Secondary | ICD-10-CM | POA: Diagnosis not present

## 2021-12-26 DIAGNOSIS — Z515 Encounter for palliative care: Secondary | ICD-10-CM

## 2021-12-26 NOTE — Progress Notes (Signed)
COMMUNITY PALLIATIVE CARE SW NOTE  PATIENT NAME: Alicia Schwartz DOB: 08-14-1930 MRN: [id number]  PRIMARY CARE PROVIDER: Aida Puffer, MD  RESPONSIBLE PARTY:  Acct ID - Guarantor Home Phone Work Phone Relationship Acct Type  [id number] - [place],NE* [phone]  Self P/F     [address], [place], [place] [address]     SOCIAL WORK TELEPHONIC ENCOUNTER  PC SW completed a telephonic encounter with patient's son-Alicia Schwartz. [name] is visiting with patient at her home post discharge from [facility] for rehab. He advised that patient is doing fair, but she will require more in-home assistance. She is currently receiving  PT, OT and SN services through [facility]. [name] advised that he will be returning to [place] and when this happens she will need in-home care. SW scheduled a visit with patient and [name] for 12/27/21 @ 12:30. SW will assess patient status and provide resources to the patient and her son.    [address] [place], [place]

## 2021-12-27 ENCOUNTER — Other Ambulatory Visit: Payer: BC Managed Care – PPO

## 2021-12-27 DIAGNOSIS — Z515 Encounter for palliative care: Secondary | ICD-10-CM

## 2021-12-27 NOTE — Progress Notes (Signed)
COMMUNITY PALLIATIVE CARE SW NOTE  PATIENT NAME: Alicia Schwartz DOB: 1931-07-04 MRN: [id number]  PRIMARY CARE PROVIDER: Tamsen Roers, MD  RESPONSIBLE PARTY:  Acct ID - Guarantor Home Phone Work Phone Relationship Acct Type  [id number] - [place],NE* [phone]  Self P/F     [address], [place], [place] [address]    SOCIAL WORK Encounter  SW completed a face-to-face visit with patient and her son-[name] at her home post discharge from [facility]. Patient was sitting in a chair. She was initially awake, alert and engaged, but was later observed napping on and off. She denied pain. SW discussed patient status and needs. Patient is currently receiving PT, OT and RN visits via [facility]. Quita Skye advised that she needs mobile meals and in-home care. SW advised she will make referral for mobile meals. SW also advised that in-home care will be self-pay. SW advised him to call patient's insurance to see if he plan includes in-home care. SW also discussed the plan to ensure patient is safe in her home. SW suggested he look into a the Life Alert or some type of video monitoring. The patient was adamant that she did not want a Life Alert due to the monthly cost. SW strongly encouraged Quita Skye to consider one of the above options to ensure patient's safety. SW reviewed the MOST form with Quita Skye and patient. He will take form to patient's physician visit in the morning to complete. SW to follow-up with patient to complete her [facility] paperwork in the next few weeks.  Patient and her son are open to ongoing support and visits from the palliative care team.  No other concerns noted.    [address] [place], [place]

## 2021-12-28 DIAGNOSIS — N1832 Chronic kidney disease, stage 3b: Secondary | ICD-10-CM | POA: Diagnosis not present

## 2021-12-28 DIAGNOSIS — Z09 Encounter for follow-up examination after completed treatment for conditions other than malignant neoplasm: Secondary | ICD-10-CM | POA: Diagnosis not present

## 2021-12-28 DIAGNOSIS — R4589 Other symptoms and signs involving emotional state: Secondary | ICD-10-CM | POA: Diagnosis not present

## 2021-12-28 DIAGNOSIS — R531 Weakness: Secondary | ICD-10-CM | POA: Diagnosis not present

## 2021-12-28 DIAGNOSIS — R600 Localized edema: Secondary | ICD-10-CM | POA: Diagnosis not present

## 2021-12-28 DIAGNOSIS — R001 Bradycardia, unspecified: Secondary | ICD-10-CM | POA: Diagnosis not present

## 2022-01-04 DIAGNOSIS — R600 Localized edema: Secondary | ICD-10-CM | POA: Diagnosis not present

## 2022-01-04 DIAGNOSIS — R001 Bradycardia, unspecified: Secondary | ICD-10-CM | POA: Diagnosis not present

## 2022-01-04 DIAGNOSIS — E875 Hyperkalemia: Secondary | ICD-10-CM | POA: Diagnosis not present

## 2022-01-04 DIAGNOSIS — D649 Anemia, unspecified: Secondary | ICD-10-CM | POA: Diagnosis not present

## 2022-01-04 DIAGNOSIS — N1832 Chronic kidney disease, stage 3b: Secondary | ICD-10-CM | POA: Diagnosis not present

## 2022-01-26 ENCOUNTER — Encounter: Payer: Self-pay | Admitting: Cardiovascular Disease

## 2022-01-26 ENCOUNTER — Ambulatory Visit (INDEPENDENT_AMBULATORY_CARE_PROVIDER_SITE_OTHER): Payer: Medicare HMO | Admitting: Cardiovascular Disease

## 2022-01-26 DIAGNOSIS — I10 Essential (primary) hypertension: Secondary | ICD-10-CM

## 2022-01-26 DIAGNOSIS — R600 Localized edema: Secondary | ICD-10-CM | POA: Diagnosis not present

## 2022-01-26 MED ORDER — FUROSEMIDE 20 MG PO TABS: 20.0000 mg | ORAL_TABLET | Freq: Every day | ORAL | 1 refills | Status: DC

## 2022-01-26 NOTE — Patient Instructions (Addendum)
Medication Instructions:   -Increase furosemide (lasix) to 20mg  once daily.  *If you need a refill on your cardiac medications before your next appointment, please call your pharmacy*   Lab Work: Your physician recommends that you return for lab work in: 2 weeks for BMET  If you have labs (blood work) drawn today and your tests are completely normal, you will receive your results only by: MyChart Message (if you have MyChart) OR A paper copy in the mail If you have any lab test that is abnormal or we need to change your treatment, we will call you to review the results.  Follow-Up: At [facility], you and your health needs are our priority.  As part of our continuing mission to provide you with exceptional heart care, we have created designated Provider Care Teams.  These Care Teams include your primary Cardiologist (physician) and Advanced Practice Providers (APPs -  Physician Assistants and Nurse Practitioners) who all work together to provide you with the care you need, when you need it.  We recommend signing up for the patient portal called "MyChart".  Sign up information is provided on this After Visit Summary.  MyChart is used to connect with patients for Virtual Visits (Telemedicine).  Patients are able to view lab/test results, encounter notes, upcoming appointments, etc.  Non-urgent messages can be sent to your provider as well.   To learn more about what you can do with MyChart, go to [facility].    Your next appointment:   4 week(s)  The format for your next appointment:   In Person  Provider:   [email or web address], PA-C, Micah Flesher, PA-C, Marjie Skiff, PA-C, Juanda Crumble, DNP, ANP, Joni Reining, PA-C, or Azalee Course, NP       Then, Bernadene Person, MD will plan to see you again in 6 month(s).

## 2022-01-26 NOTE — Progress Notes (Signed)
01/26/2022 Alicia Schwartz   August 25, 1930  [id number]  Primary Physician Linus Galas, NP Primary Cardiologist: Runell Gess MD Nicholes Calamity, [place]  HPI:  Alicia Schwartz is a 88 y.o. thin-appearing widowed Caucasian female mother of 1 son who lives in [place] who is referred by Linus Galas, NP at [facility] medical for lower extremity edema.  She is accompanied by her niece [name].  She apparently has had a palliative care consult put in as well.  I have taken care of several of her family members in the past.  Her only cardiac risk factor is hypertension.  She is never had a heart attack or stroke.  She denies chest pain or shortness of breath.  She does live alone although her niece lives close by.  She has had lower extremity edema recently and was placed on low-dose diuretic.  2D echo was performed at her primary care physician's office.   Current Meds  Medication Sig   acetaminophen (TYLENOL) 325 MG tablet Take 2 tablets (650 mg total) by mouth every 6 (six) hours as needed for mild pain (or Fever >/= 101).   CALCIUM PO Take 1 tablet by mouth daily.   ferrous sulfate 325 (65 FE) MG tablet Take 1 tablet (325 mg total) by mouth 3 (three) times daily after meals.   folic acid (FOLVITE) 1 MG tablet Take 1 mg by mouth daily.   HYDROcodone-acetaminophen (NORCO) 5-325 MG tablet Take 1-2 tablets by mouth every 6 (six) hours as needed for moderate pain (1 tab for moderate pain and 2 tabs for severe pain PRN). MAXIMUM TOTAL ACETAMINOPHEN DOSE IS 4000 MG PER DAY   ibuprofen (ADVIL) 200 MG tablet Take 1 tablet (200 mg total) by mouth every 6 (six) hours as needed for mild pain.   losartan-hydrochlorothiazide (HYZAAR) 50-12.5 MG tablet Take 1 tablet by mouth daily.   metoprolol succinate (TOPROL-XL) 50 MG 24 hr tablet Take 25 mg by mouth daily.   Multiple Vitamin (MULTIVITAMIN) tablet Take 1 tablet by mouth daily.   polyethylene glycol (MIRALAX / GLYCOLAX) packet Take 17 g by mouth  daily.   sertraline (ZOLOFT) 25 MG tablet Take 25 mg by mouth daily.   [DISCONTINUED] amLODipine (NORVASC) 10 MG tablet Take 1 tablet (10 mg total) by mouth daily.   [DISCONTINUED] bisacodyl (DULCOLAX) 10 MG suppository Place 1 suppository (10 mg total) rectally daily as needed for moderate constipation.   [DISCONTINUED] docusate sodium (COLACE) 100 MG capsule Take 1 capsule (100 mg total) by mouth 2 (two) times daily.   [DISCONTINUED] furosemide (LASIX) 20 MG tablet Take 10 mg by mouth.   [DISCONTINUED] lisinopril (ZESTRIL) 20 MG tablet Take 1 tablet (20 mg total) by mouth daily.   [DISCONTINUED] ondansetron (ZOFRAN) 4 MG tablet Take 1 tablet (4 mg total) by mouth every 6 (six) hours as needed for nausea.   [DISCONTINUED] senna (SENOKOT) 8.6 MG TABS tablet Take 1 tablet (8.6 mg total) by mouth 2 (two) times daily as needed for mild constipation.   [DISCONTINUED] spironolactone (ALDACTONE) 25 MG tablet Take 25 mg by mouth at bedtime.     No Known Allergies  Social History   Socioeconomic History   Marital status: Widowed    Spouse name: Not on file   Number of children: Not on file   Years of education: Not on file   Highest education level: Not on file  Occupational History   Not on file  Tobacco Use   Smoking status: Never  Smokeless tobacco: Never  Substance and Sexual Activity   Alcohol use: No   Drug use: Not on file   Sexual activity: Not on file  Other Topics Concern   Not on file  Social History Narrative   Not on file   Social Determinants of Health   Financial Resource Strain: Not on file  Food Insecurity: Not on file  Transportation Needs: Not on file  Physical Activity: Not on file  Stress: Not on file  Social Connections: Not on file  Intimate Partner Violence: Not on file     Review of Systems: General: negative for chills, fever, night sweats or weight changes.  Cardiovascular: negative for chest pain, dyspnea on exertion, edema, orthopnea,  palpitations, paroxysmal nocturnal dyspnea or shortness of breath Dermatological: negative for rash Respiratory: negative for cough or wheezing Urologic: negative for hematuria Abdominal: negative for nausea, vomiting, diarrhea, bright red blood per rectum, melena, or hematemesis Neurologic: negative for visual changes, syncope, or dizziness All other systems reviewed and are otherwise negative except as noted above.    Blood pressure (!) 148/66, pulse 71, height 4\' 9"  (1.448 m), weight 105 lb (47.6 kg).  General appearance: alert and no distress Neck: no adenopathy, no carotid bruit, no JVD, supple, symmetrical, trachea midline, and thyroid not enlarged, symmetric, no tenderness/mass/nodules Lungs: clear to auscultation bilaterally Heart: regular rate and rhythm, S1, S2 normal, no murmur, click, rub or gallop Extremities: 2-3+ bilateral lower extremity edema. Pulses: 2+ and symmetric Skin: Skin color, texture, turgor normal. No rashes or lesions Neurologic: Grossly normal  EKG sinus rhythm at 71 with poor R wave progression and nonspecific ST-T wave changes.  Personally reviewed this EKG.  ASSESSMENT AND PLAN:   HYPERTENSION, BENIGN SYSTEMIC History of essential hypertension a blood pressure measured today at 148/66.  She is on losartan, hydrochlorothiazide and metoprolol.  Lower extremity edema Patient has 2-3+ pitting edema at her ankles and halfway up her calves.  She apparently had a 2D echocardiogram performed at her primary care physician's office on 01/04/2022 showed normal LV systolic function without valvular abnormalities.  Diastolic function was not commented on.  She is on hydrochlorothiazide and low-dose furosemide.  She really denies shortness of breath.  I am going to increase her furosemide from 10 to 20 mg a day we will have her see an APP back in 4 to 6 weeks in follow-up.     01/06/2022 MD FACP,FACC,FAHA, [facility] 01/26/2022 3:01 PM

## 2022-01-26 NOTE — Assessment & Plan Note (Signed)
History of essential hypertension a blood pressure measured today at 148/66.  She is on losartan, hydrochlorothiazide and metoprolol.

## 2022-01-26 NOTE — Assessment & Plan Note (Addendum)
Patient has 2-3+ pitting edema at her ankles and halfway up her calves.  She apparently had a 2D echocardiogram performed at her primary care physician's office on 01/04/2022 showed normal LV systolic function without valvular abnormalities.  Diastolic function was not commented on.  She is on hydrochlorothiazide and low-dose furosemide.  She really denies shortness of breath.  I am going to increase her furosemide from 10 to 20 mg a day we will have her see an APP back in 4 to 6 weeks in follow-up.

## 2022-02-08 ENCOUNTER — Other Ambulatory Visit: Payer: BC Managed Care – PPO

## 2022-02-08 DIAGNOSIS — Z515 Encounter for palliative care: Secondary | ICD-10-CM

## 2022-02-08 NOTE — Progress Notes (Signed)
COMMUNITY PALLIATIVE CARE SW NOTE  PATIENT NAME: Alicia Schwartz DOB: 1931/04/09 MRN: [id number]  PRIMARY CARE PROVIDER: Linus Galas, NP  RESPONSIBLE PARTY:  Acct ID - Guarantor Home Phone Work Phone Relationship Acct Type  [id number] - [place]* [phone]  Self P/F     [address], [place], [place] [address]   SOCIAL WORK TELEPHONIC VISIT  SW completed a telephonic visit with patient's niece/PCG-[name]. She updated patient status. Patient will complete and be discharged from all therapies with [facility] this week. She is receiving hot mobile meals daily. She is on the waiting list for in-home care through [facility]. Her son's goal is to  keep her home and states that patient cannot financially pay to have in-home help. He did apply for Medicaid, but she did not qualify. Her niece will continue to check on patient daily, manage her (Pt.) meds with a pill box and provide any assistance to her as needed. [name] will assist patient to and from doctor's appointments. SW reinforced access to support through palliative care care and encouraged [name] to call with any needs/concerns.   [address] [place], [place]

## 2022-02-15 DIAGNOSIS — R600 Localized edema: Secondary | ICD-10-CM | POA: Diagnosis not present

## 2022-02-15 DIAGNOSIS — E871 Hypo-osmolality and hyponatremia: Secondary | ICD-10-CM | POA: Diagnosis not present

## 2022-02-15 DIAGNOSIS — N1832 Chronic kidney disease, stage 3b: Secondary | ICD-10-CM | POA: Diagnosis not present

## 2022-02-15 DIAGNOSIS — I129 Hypertensive chronic kidney disease with stage 1 through stage 4 chronic kidney disease, or unspecified chronic kidney disease: Secondary | ICD-10-CM | POA: Diagnosis not present

## 2022-02-19 ENCOUNTER — Other Ambulatory Visit: Payer: Self-pay | Admitting: Internal Medicine

## 2022-02-19 DIAGNOSIS — N1832 Chronic kidney disease, stage 3b: Secondary | ICD-10-CM

## 2022-02-23 ENCOUNTER — Inpatient Hospital Stay (HOSPITAL_COMMUNITY)
Admission: EM | Admit: 2022-02-23 | Discharge: 2022-02-27 | DRG: 683 | Disposition: A | Discharge status: Skilled Nursing Facility | Payer: Medicare HMO | Attending: Internal Medicine | Admitting: Internal Medicine

## 2022-02-23 ENCOUNTER — Emergency Department (HOSPITAL_COMMUNITY): Payer: Medicare HMO

## 2022-02-23 ENCOUNTER — Other Ambulatory Visit: Payer: Self-pay

## 2022-02-23 ENCOUNTER — Encounter (HOSPITAL_COMMUNITY): Payer: Self-pay

## 2022-02-23 DIAGNOSIS — E860 Dehydration: Secondary | ICD-10-CM | POA: Diagnosis present

## 2022-02-23 DIAGNOSIS — R54 Age-related physical debility: Secondary | ICD-10-CM | POA: Diagnosis present

## 2022-02-23 DIAGNOSIS — M6281 Muscle weakness (generalized): Secondary | ICD-10-CM | POA: Diagnosis not present

## 2022-02-23 DIAGNOSIS — I471 Supraventricular tachycardia: Secondary | ICD-10-CM | POA: Diagnosis present

## 2022-02-23 DIAGNOSIS — E871 Hypo-osmolality and hyponatremia: Secondary | ICD-10-CM | POA: Diagnosis not present

## 2022-02-23 DIAGNOSIS — T796XXD Traumatic ischemia of muscle, subsequent encounter: Secondary | ICD-10-CM | POA: Diagnosis not present

## 2022-02-23 DIAGNOSIS — Z6828 Body mass index (BMI) 28.0-28.9, adult: Secondary | ICD-10-CM | POA: Diagnosis not present

## 2022-02-23 DIAGNOSIS — H9190 Unspecified hearing loss, unspecified ear: Secondary | ICD-10-CM | POA: Diagnosis present

## 2022-02-23 DIAGNOSIS — Z66 Do not resuscitate: Secondary | ICD-10-CM | POA: Diagnosis present

## 2022-02-23 DIAGNOSIS — Z820 Family history of epilepsy and other diseases of the nervous system: Secondary | ICD-10-CM

## 2022-02-23 DIAGNOSIS — Z9889 Other specified postprocedural states: Secondary | ICD-10-CM | POA: Diagnosis not present

## 2022-02-23 DIAGNOSIS — N179 Acute kidney failure, unspecified: Secondary | ICD-10-CM | POA: Diagnosis not present

## 2022-02-23 DIAGNOSIS — W06XXXA Fall from bed, initial encounter: Secondary | ICD-10-CM | POA: Diagnosis present

## 2022-02-23 DIAGNOSIS — Z8042 Family history of malignant neoplasm of prostate: Secondary | ICD-10-CM | POA: Diagnosis not present

## 2022-02-23 DIAGNOSIS — M6282 Rhabdomyolysis: Secondary | ICD-10-CM | POA: Diagnosis present

## 2022-02-23 DIAGNOSIS — R2689 Other abnormalities of gait and mobility: Secondary | ICD-10-CM | POA: Diagnosis not present

## 2022-02-23 DIAGNOSIS — R7989 Other specified abnormal findings of blood chemistry: Secondary | ICD-10-CM | POA: Diagnosis not present

## 2022-02-23 DIAGNOSIS — I10 Essential (primary) hypertension: Secondary | ICD-10-CM | POA: Diagnosis present

## 2022-02-23 DIAGNOSIS — E46 Unspecified protein-calorie malnutrition: Secondary | ICD-10-CM | POA: Diagnosis not present

## 2022-02-23 DIAGNOSIS — W19XXXA Unspecified fall, initial encounter: Secondary | ICD-10-CM | POA: Diagnosis not present

## 2022-02-23 DIAGNOSIS — E876 Hypokalemia: Secondary | ICD-10-CM | POA: Diagnosis present

## 2022-02-23 DIAGNOSIS — R778 Other specified abnormalities of plasma proteins: Secondary | ICD-10-CM | POA: Diagnosis present

## 2022-02-23 DIAGNOSIS — D649 Anemia, unspecified: Secondary | ICD-10-CM | POA: Diagnosis not present

## 2022-02-23 DIAGNOSIS — R531 Weakness: Secondary | ICD-10-CM | POA: Diagnosis not present

## 2022-02-23 DIAGNOSIS — Y92003 Bedroom of unspecified non-institutional (private) residence as the place of occurrence of the external cause: Secondary | ICD-10-CM

## 2022-02-23 DIAGNOSIS — Z7401 Bed confinement status: Secondary | ICD-10-CM | POA: Diagnosis not present

## 2022-02-23 DIAGNOSIS — E861 Hypovolemia: Secondary | ICD-10-CM | POA: Diagnosis present

## 2022-02-23 DIAGNOSIS — R410 Disorientation, unspecified: Secondary | ICD-10-CM | POA: Diagnosis not present

## 2022-02-23 DIAGNOSIS — E869 Volume depletion, unspecified: Secondary | ICD-10-CM | POA: Diagnosis present

## 2022-02-23 DIAGNOSIS — N1832 Chronic kidney disease, stage 3b: Secondary | ICD-10-CM | POA: Diagnosis not present

## 2022-02-23 DIAGNOSIS — R5381 Other malaise: Secondary | ICD-10-CM | POA: Diagnosis not present

## 2022-02-23 DIAGNOSIS — F419 Anxiety disorder, unspecified: Secondary | ICD-10-CM | POA: Diagnosis not present

## 2022-02-23 DIAGNOSIS — Z043 Encounter for examination and observation following other accident: Secondary | ICD-10-CM | POA: Diagnosis not present

## 2022-02-23 LAB — CBC WITH DIFFERENTIAL/PLATELET
Abs Immature Granulocytes: 0.05 10*3/uL (ref 0.00–0.07)
Basophils Absolute: 0 10*3/uL (ref 0.0–0.1)
Basophils Relative: 0 %
Eosinophils Absolute: 0.1 10*3/uL (ref 0.0–0.5)
Eosinophils Relative: 1 %
HCT: 32.3 % — ABNORMAL LOW (ref 36.0–46.0)
Hemoglobin: 11.5 g/dL — ABNORMAL LOW (ref 12.0–15.0)
Immature Granulocytes: 1 %
Lymphocytes Relative: 13 %
Lymphs Abs: 1.3 10*3/uL (ref 0.7–4.0)
MCH: 35.2 pg — ABNORMAL HIGH (ref 26.0–34.0)
MCHC: 35.6 g/dL (ref 30.0–36.0)
MCV: 98.8 fL (ref 80.0–100.0)
Monocytes Absolute: 0.7 10*3/uL (ref 0.1–1.0)
Monocytes Relative: 7 %
Neutro Abs: 7.8 10*3/uL — ABNORMAL HIGH (ref 1.7–7.7)
Neutrophils Relative %: 78 %
Platelets: 263 10*3/uL (ref 150–400)
RBC: 3.27 MIL/uL — ABNORMAL LOW (ref 3.87–5.11)
RDW: 12.2 % (ref 11.5–15.5)
WBC: 10.1 10*3/uL (ref 4.0–10.5)
nRBC: 0 % (ref 0.0–0.2)

## 2022-02-23 LAB — COMPREHENSIVE METABOLIC PANEL
ALT: 29 U/L (ref 0–44)
AST: 53 U/L — ABNORMAL HIGH (ref 15–41)
Albumin: 4.2 g/dL (ref 3.5–5.0)
Alkaline Phosphatase: 93 U/L (ref 38–126)
Anion gap: 11 (ref 5–15)
BUN: 50 mg/dL — ABNORMAL HIGH (ref 8–23)
CO2: 24 mmol/L (ref 22–32)
Calcium: 9.3 mg/dL (ref 8.9–10.3)
Chloride: 94 mmol/L — ABNORMAL LOW (ref 98–111)
Creatinine, Ser: 1.76 mg/dL — ABNORMAL HIGH (ref 0.44–1.00)
GFR, Estimated: 27 mL/min — ABNORMAL LOW (ref >60)
Glucose, Bld: 116 mg/dL — ABNORMAL HIGH (ref 70–99)
Potassium: 3.4 mmol/L — ABNORMAL LOW (ref 3.5–5.1)
Sodium: 129 mmol/L — ABNORMAL LOW (ref 135–145)
Total Bilirubin: 0.6 mg/dL (ref 0.3–1.2)
Total Protein: 7.5 g/dL (ref 6.5–8.1)

## 2022-02-23 LAB — CK: Total CK: 669 U/L — ABNORMAL HIGH (ref 38–234)

## 2022-02-23 LAB — TROPONIN I (HIGH SENSITIVITY)
Troponin I (High Sensitivity): 62 ng/L — ABNORMAL HIGH (ref <18)
Troponin I (High Sensitivity): 54 ng/L — ABNORMAL HIGH (ref <18)

## 2022-02-23 MED ORDER — ACETAMINOPHEN 325 MG PO TABS
650.0000 mg | ORAL_TABLET | Freq: Once | ORAL | Status: AC
  Administered 2022-02-23: 650 mg via ORAL
  Filled 2022-02-23: qty 2

## 2022-02-23 MED ORDER — ASPIRIN 81 MG PO CHEW
324.0000 mg | CHEWABLE_TABLET | Freq: Once | ORAL | Status: AC
  Administered 2022-02-23: 324 mg via ORAL
  Filled 2022-02-23: qty 4

## 2022-02-23 MED ORDER — LACTATED RINGERS IV BOLUS
500.0000 mL | Freq: Once | INTRAVENOUS | Status: AC
  Administered 2022-02-23: 500 mL via INTRAVENOUS

## 2022-02-23 NOTE — ED Triage Notes (Signed)
Pt BIB EMS from home. Pt had a fall today. Pt reports falling on her bottom. Pt denies hitting her head, denies LOC, and denies taking blood thinners. Pt also reports increased generalized weakness. Pt normally uses a walker. A&O x4.

## 2022-02-23 NOTE — ED Notes (Signed)
EMERGENCY CONTACT  [name]: [phone]

## 2022-02-23 NOTE — H&P (Signed)
History and Physical   [facility] [place] [facility] [place] [facility] Admission History and Physical [employer], D.O.    Patient Name: Alicia Schwartz MR#: [id number] Date of Birth: [birth year] Date of Admission: 02/23/2022  Referring MD/NP/PA: Dr. Criss Alvine Primary Care Physician: Linus Galas, NP  Chief Complaint:  Chief Complaint  Patient presents with   Fall    HPI: Alicia Schwartz is a 88 y.o. female with a known history of hypertension, SVT presents to the emergency department for evaluation of fall.  Patient was in a usual state of health until last night she fell out of her bed while she was trying to turn over and turn the lights off.  She landed on her backside and was unable to stand up due to generalized weakness.  She denies any focal pain or injury but she does say that she was sore because she had to crawl around the floor because she could not get up.  She was on the ground until this morning when she called her family member who brought her to the emergency department.  Patient lives at home alone.    Patient denies fevers/chills, dizziness, chest pain, shortness of breath, N/V/C/D, abdominal pain, dysuria/frequency, changes in mental status.    Otherwise there has been no change in status. Patient has been taking medication as prescribed and there has been no recent change in medication or diet.  No recent antibiotics.  There has been no recent illness, hospitalizations, travel or sick contacts.    EMS/ED Course: Patient received aspirin, Tylenol, lactated Ringer's. Medical admission has been requested for further management of AKI, elevated troponin.  Review of Systems:  CONSTITUTIONAL: Positive generalized weakness.  No fever/chills, fatigue, weight gain/loss, headache. EYES: No blurry or double vision. ENT: No tinnitus, postnasal drip, redness or soreness of the oropharynx. RESPIRATORY: No cough, dyspnea, wheeze.  No hemoptysis.  CARDIOVASCULAR: No chest pain,  palpitations, syncope, orthopnea. No lower extremity edema.  GASTROINTESTINAL: No nausea, vomiting, abdominal pain, diarrhea, constipation.  No hematemesis, melena or hematochezia. GENITOURINARY: No dysuria, frequency, hematuria. ENDOCRINE: No polyuria or nocturia. No heat or cold intolerance. HEMATOLOGY: No anemia, bruising, bleeding. INTEGUMENTARY: No rashes, ulcers, lesions. MUSCULOSKELETAL: Positive diffuse muscle soreness, back pain no arthritis, gout, dyspnea. NEUROLOGIC: No numbness, tingling, ataxia, seizure-type activity, weakness. PSYCHIATRIC: No anxiety, depression, insomnia.   Past Medical History:  Diagnosis Date   Fracture, subtrochanteric, right femur, closed (HCC) 02/11/2015   Hypertension    Paroxysmal supraventricular tachycardia (HCC) 02/12/2015    Past Surgical History:  Procedure Laterality Date   ABDOMINAL HYSTERECTOMY     INTRAMEDULLARY (IM) NAIL INTERTROCHANTERIC Right 02/12/2015   Procedure: INTRAMEDULLARY (IM) NAIL INTERTROCHANTRIC;  Surgeon: Teryl Lucy, MD;  Location: [facility] OR;  Service: Orthopedics;  Laterality: Right;   IR VERTEBROPLASTY CERV/THOR BX INC UNI/BIL INC/INJECT/IMAGING  07/08/2019     reports that she has never smoked. She has never used smokeless tobacco. She reports that she does not drink alcohol. No history on file for drug use.  No Known Allergies  Family History  Problem Relation Age of Onset   Alzheimer's disease Mother    Prostate cancer Father     Prior to Admission medications   Medication Sig Start Date End Date Taking? Authorizing Provider  acetaminophen (TYLENOL) 325 MG tablet Take 2 tablets (650 mg total) by mouth every 6 (six) hours as needed for mild pain (or Fever >/= 101). 07/12/19  Yes Calvert Cantor, MD  CALCIUM PO Take 1 tablet by mouth daily.  Yes [provider]  ferrous sulfate 325 (65 FE) MG tablet Take 1 tablet (325 mg total) by mouth 3 (three) times daily after meals. Patient taking differently: Take  325 mg by mouth daily with breakfast. 02/15/15  Yes Vann, Jessica U, DO  folic acid (FOLVITE) 1 MG tablet Take 1 mg by mouth daily.   Yes [provider]  furosemide (LASIX) 20 MG tablet Take 1 tablet (20 mg total) by mouth daily. Patient taking differently: Take 10 mg by mouth daily. 01/26/22  Yes Runell Gess, MD  ibuprofen (ADVIL) 200 MG tablet Take 1 tablet (200 mg total) by mouth every 6 (six) hours as needed for mild pain. 07/12/19  Yes Calvert Cantor, MD  losartan-hydrochlorothiazide (HYZAAR) 50-12.5 MG tablet Take 1 tablet by mouth daily. 10/19/21  Yes [provider]  metoprolol succinate (TOPROL-XL) 50 MG 24 hr tablet Take 25 mg by mouth at bedtime. 06/23/19  Yes [provider]  Multiple Vitamin (MULTIVITAMIN) tablet Take 1 tablet by mouth daily.   Yes [provider]  sertraline (ZOLOFT) 25 MG tablet Take 25 mg by mouth daily.   Yes [provider]  HYDROcodone-acetaminophen (NORCO) 5-325 MG tablet Take 1-2 tablets by mouth every 6 (six) hours as needed for moderate pain (1 tab for moderate pain and 2 tabs for severe pain PRN). MAXIMUM TOTAL ACETAMINOPHEN DOSE IS 4000 MG PER DAY 07/12/19   Calvert Cantor, MD  polyethylene glycol (MIRALAX / GLYCOLAX) packet Take 17 g by mouth daily. 02/15/15   Joseph Art, DO    Physical Exam: Vitals:   02/23/22 2045 02/23/22 2101 02/23/22 2145 02/23/22 2200  BP: (!) 200/63  (!) 153/54 (!) 148/47  Pulse: 74  71 73  Resp: 13  15 15   Temp:  97.7 F (36.5 C)    TempSrc:  Oral    SpO2: 100%  100% 99%    GENERAL: 88 y.o.-year-old female patient, hard of hearing, lying in the bed in no acute distress.  Pleasant and cooperative.   HEENT: Head atraumatic, normocephalic. Pupils equal. Mucus membranes dry NECK: Supple. No JVD. CHEST: Normal breath sounds bilaterally. No wheezing, rales, rhonchi or crackles. No use of accessory muscles of respiration.  No reproducible chest wall tenderness.  CARDIOVASCULAR:  S1, S2 normal. Pulses intact distally.  ABDOMEN: Soft, nondistended, nontender. No rebound, guarding, rigidity. Normoactive bowel sounds present in all four quadrants.  EXTREMITIES: No pedal edema, cyanosis, or clubbing. No calf tenderness or Homan's sign.  NEUROLOGIC: The patient is alert and oriented x 3. Hard of hearing.  PSYCHIATRIC:  Normal affect, mood, thought content. SKIN: Warm, dry, and intact without obvious rash, lesion, or ulcer.    Labs on Admission:  CBC: Recent Labs  Lab 02/23/22 1736  WBC 10.1  NEUTROABS 7.8*  HGB 11.5*  HCT 32.3*  MCV 98.8  PLT 263   Basic Metabolic Panel: Recent Labs  Lab 02/23/22 1736  NA 129*  K 3.4*  CL 94*  CO2 24  GLUCOSE 116*  BUN 50*  CREATININE 1.76*  CALCIUM 9.3   GFR: CrCl cannot be calculated (Unknown ideal weight.). Liver Function Tests: Recent Labs  Lab 02/23/22 1736  AST 53*  ALT 29  ALKPHOS 93  BILITOT 0.6  PROT 7.5  ALBUMIN 4.2   No results for input(s): "LIPASE", "AMYLASE" in the last 168 hours. No results for input(s): "AMMONIA" in the last 168 hours. Coagulation Profile: No results for input(s): "INR", "PROTIME" in the last 168 hours. Cardiac Enzymes:  Recent Labs  Lab 02/23/22 1736  CKTOTAL 669*   BNP (last 3 results) No results for input(s): "PROBNP" in the last 8760 hours. HbA1C: No results for input(s): "HGBA1C" in the last 72 hours. CBG: No results for input(s): "GLUCAP" in the last 168 hours. Lipid Profile: No results for input(s): "CHOL", "HDL", "LDLCALC", "TRIG", "CHOLHDL", "LDLDIRECT" in the last 72 hours. Thyroid Function Tests: No results for input(s): "TSH", "T4TOTAL", "FREET4", "T3FREE", "THYROIDAB" in the last 72 hours. Anemia Panel: No results for input(s): "VITAMINB12", "FOLATE", "FERRITIN", "TIBC", "IRON", "RETICCTPCT" in the last 72 hours. Urine analysis:    Component Value Date/Time   COLORURINE YELLOW 11/03/2021 2049   APPEARANCEUR HAZY (A) 11/03/2021 2049   LABSPEC  1.011 11/03/2021 2049   PHURINE 7.0 11/03/2021 2049   GLUCOSEU NEGATIVE 11/03/2021 2049   HGBUR SMALL (A) 11/03/2021 2049   BILIRUBINUR NEGATIVE 11/03/2021 2049   KETONESUR NEGATIVE 11/03/2021 2049   PROTEINUR 30 (A) 11/03/2021 2049   NITRITE NEGATIVE 11/03/2021 2049   LEUKOCYTESUR MODERATE (A) 11/03/2021 2049   Sepsis Labs: @LABRCNTIP (procalcitonin:4,lacticidven:4) )No results found for this or any previous visit (from the past 240 hour(s)).   Radiological Exams on Admission: DG Chest Portable 1 View  Result Date: 02/23/2022 CLINICAL DATA:  Weakness and recent fall. EXAM: PORTABLE CHEST 1 VIEW COMPARISON:  November 03, 2021 FINDINGS: The heart size and mediastinal contours are within normal limits. There is marked severity calcification and tortuosity of the thoracic aorta. Both lungs are clear. Chronic left-sided rib fractures are seen. The visualized skeletal structures are otherwise unremarkable. IMPRESSION: 1. No active cardiopulmonary disease. Electronically Signed   By: November 05, 2021 M.D.   On: 02/23/2022 21:22   DG Hips Bilat W or Wo Pelvis 2 Views  Result Date: 02/23/2022 CLINICAL DATA:  Fall EXAM: DG HIP (WITH OR WITHOUT PELVIS) 2V BILAT COMPARISON:  02/11/2015 FINDINGS: Postoperative changes in the right femur. Hip joints are symmetric. No acute bony abnormality. Specifically, no fracture, subluxation, or dislocation. IMPRESSION: No acute bony abnormality. Electronically Signed   By: 02/13/2015 M.D.   On: 02/23/2022 20:04   DG Sacrum/Coccyx  Result Date: 02/23/2022 CLINICAL DATA:  Fall EXAM: SACRUM AND COCCYX - 2+ VIEW COMPARISON:  11/03/2021 FINDINGS: Postoperative changes in the right femur. Angulation noted in the lower sacrum/upper coccyx appears chronic and may be related to congenital deformity or old injury. No definite acute fracture. Hip joints are symmetric. IMPRESSION: Angulation in the lower sacrum/upper coccyx, favor congenital deformity versus old injury. No definite  acute fracture. Electronically Signed   By: 11/05/2021 M.D.   On: 02/23/2022 20:02    EKG: Normal sinus rhythm at 74 bpm with normal axis and nonspecific ST-T wave changes.   Assessment/Plan  This is a 88 y.o. female with a history of hypertension, SVT now being admitted with:  #. Acute kidney injury  -Admit inpatient for gentle fluid hydration - IV fluids and repeat BMP in AM.  -We will hold Hyzaar for now.  #.  Elevated troponin, likely secondary to AKI as patient has had no chest pain or anginal type symptoms - Monitor on telemetry - Trend troponins  #.  Severe uncontrolled hypertension - Resume home medications-Lasix, metoprolol - Hydralazine as needed  #. Mild hypokalemia - Replace orally - Check mag level  #. Debility and generalized weakness - PT eval and social work for consideration of placement as patient lives alone.   Admission status: Inpatient, telemetry IV Fluids: Normal saline Diet/Nutrition: Heart healthy Consults called: PT  for dispo DVT Px: Lovenox, SCDs and early ambulation. Code Status: Full Code  Disposition Plan: To home be determined  All the records are reviewed and case discussed with ED provider. Management plans discussed with the patient and/or family who express understanding and agree with plan of care.  Leslieann Whisman D.O. on 02/23/2022 at 10:11 PM CC: Primary care physician; Linus Galas, NP   02/23/2022, 10:11 PM

## 2022-02-23 NOTE — ED Provider Triage Note (Signed)
Emergency Medicine Provider Triage Evaluation Note  Debra Colon , a 88 y.o. female  was evaluated in triage.  Pt complains of fall.  She denies hitting her head.  Complains of pain over her tailbone.  Denies loss of consciousness.  Unsure if this happened last night or this morning.  She states this happened around 10:00.  States she remained on the ground until EMS arrived around 5.   Review of Systems  Positive: As above Negative: As above  Physical Exam  There were no vitals taken for this visit. Gen:   Awake, no distress   Resp:  Normal effort  MSK:   Moves extremities without difficulty Other:  Cervical, thoracic, lumbar spine without tenderness to palpation.  Bilateral hips without tenderness to palpation.  Medical Decision Making  Medically screening exam initiated at 5:09 PM.  Appropriate orders placed.  Kahdijah Olena Heckle was informed that the remainder of the evaluation will be completed by another provider, this initial triage assessment does not replace that evaluation, and the importance of remaining in the ED until their evaluation is complete.     Marita Kansas, PA-C 02/23/22 1712

## 2022-02-23 NOTE — ED Provider Notes (Signed)
[facility]-EMERGENCY DEPT Provider Note   CSN: [id number] Arrival date & time: 02/23/22  1637     History  Chief Complaint  Patient presents with   Fall    Alicia Schwartz is a 88 y.o. female.  HPI 88 year old female presents with generalized weakness and inability to get up.  Last night around 10 PM she was try to turn off her bedside light and slipped out of her bed.  Landed on her buttocks but did not really suffer any injuries.  However she was unable to get up due to weakness.  She has been feeling generally weak for a while and her knees reports its been getting worse for a while.  Patient had to crawl around and states that her legs are sore from doing so and she has tailbone pain.  Did not hit her head or lose consciousness.  She feels generally weak and was on the ground until today when she called her niece and then EMS was able to get her up and bring her here.  Home Medications Prior to Admission medications   Medication Sig Start Date End Date Taking? Authorizing Provider  acetaminophen (TYLENOL) 325 MG tablet Take 2 tablets (650 mg total) by mouth every 6 (six) hours as needed for mild pain (or Fever >/= 101). 07/12/19   Calvert Cantor, MD  CALCIUM PO Take 1 tablet by mouth daily.    [provider]  ferrous sulfate 325 (65 FE) MG tablet Take 1 tablet (325 mg total) by mouth 3 (three) times daily after meals. 02/15/15   Joseph Art, DO  folic acid (FOLVITE) 1 MG tablet Take 1 mg by mouth daily.    [provider]  furosemide (LASIX) 20 MG tablet Take 1 tablet (20 mg total) by mouth daily. 01/26/22   Runell Gess, MD  HYDROcodone-acetaminophen (NORCO) 5-325 MG tablet Take 1-2 tablets by mouth every 6 (six) hours as needed for moderate pain (1 tab for moderate pain and 2 tabs for severe pain PRN). MAXIMUM TOTAL ACETAMINOPHEN DOSE IS 4000 MG PER DAY 07/12/19   Calvert Cantor, MD  ibuprofen (ADVIL) 200 MG tablet Take 1 tablet (200  mg total) by mouth every 6 (six) hours as needed for mild pain. 07/12/19   Calvert Cantor, MD  losartan-hydrochlorothiazide (HYZAAR) 50-12.5 MG tablet Take 1 tablet by mouth daily. 10/19/21   [provider]  metoprolol succinate (TOPROL-XL) 50 MG 24 hr tablet Take 25 mg by mouth daily. 06/23/19   [provider]  Multiple Vitamin (MULTIVITAMIN) tablet Take 1 tablet by mouth daily.    [provider]  polyethylene glycol (MIRALAX / GLYCOLAX) packet Take 17 g by mouth daily. 02/15/15   Joseph Art, DO  sertraline (ZOLOFT) 25 MG tablet Take 25 mg by mouth daily.    [provider]      Allergies    Patient has no known allergies.    Review of Systems   Review of Systems  Constitutional:  Negative for fever.  Respiratory:  Negative for shortness of breath.   Cardiovascular:  Negative for chest pain.  Gastrointestinal:  Negative for abdominal pain.  Musculoskeletal:  Positive for arthralgias and back pain.  Neurological:  Positive for weakness. Negative for headaches.    Physical Exam Updated Vital Signs BP (!) 153/54   Pulse 71   Temp 97.7 F (36.5 C) (Oral)   Resp 15   SpO2 100%  Physical Exam Vitals and nursing note reviewed.  Constitutional:      Appearance: She is well-developed.  HENT:     Head: Normocephalic and atraumatic.  Eyes:     Extraocular Movements: Extraocular movements intact.     Pupils: Pupils are equal, round, and reactive to light.  Cardiovascular:     Rate and Rhythm: Normal rate and regular rhythm.     Pulses:          Dorsalis pedis pulses are 2+ on the right side and 2+ on the left side.     Heart sounds: Normal heart sounds.  Pulmonary:     Effort: Pulmonary effort is normal.     Breath sounds: Normal breath sounds.  Abdominal:     Palpations: Abdomen is soft.     Tenderness: There is no abdominal tenderness.  Musculoskeletal:     Lumbar back: Tenderness present.       Back:     Comments: Patient has a lot  of discomfort when I range either hip though no specific hip tenderness on exam.  Seems like her legs are in general sore.  Skin:    General: Skin is warm and dry.  Neurological:     Mental Status: She is alert and oriented to person, place, and time.     ED Results / Procedures / Treatments   Labs (all labs ordered are listed, but only abnormal results are displayed) Labs Reviewed  CBC WITH DIFFERENTIAL/PLATELET - Abnormal; Notable for the following components:      Result Value   RBC 3.27 (*)    Hemoglobin 11.5 (*)    HCT 32.3 (*)    MCH 35.2 (*)    Neutro Abs 7.8 (*)    All other components within normal limits  COMPREHENSIVE METABOLIC PANEL - Abnormal; Notable for the following components:   Sodium 129 (*)    Potassium 3.4 (*)    Chloride 94 (*)    Glucose, Bld 116 (*)    BUN 50 (*)    Creatinine, Ser 1.76 (*)    AST 53 (*)    GFR, Estimated 27 (*)    All other components within normal limits  CK - Abnormal; Notable for the following components:   Total CK 669 (*)    All other components within normal limits  TROPONIN I (HIGH SENSITIVITY) - Abnormal; Notable for the following components:   Troponin I (High Sensitivity) 62 (*)    All other components within normal limits  URINALYSIS, ROUTINE W REFLEX MICROSCOPIC  TROPONIN I (HIGH SENSITIVITY)    EKG EKG Interpretation  Date/Time:  Friday February 23 2022 19:18:44 EDT Ventricular Rate:  74 PR Interval:  126 QRS Duration: 89 QT Interval:  385 QTC Calculation: 428 R Axis:   71 Text Interpretation: Sinus rhythm Minimal ST depression, anterolateral leads ST changes similar to April 2023 Confirmed by Pricilla Loveless [phone]) on 02/23/2022 7:56:26 PM  Radiology DG Chest Portable 1 View  Result Date: 02/23/2022 CLINICAL DATA:  Weakness and recent fall. EXAM: PORTABLE CHEST 1 VIEW COMPARISON:  November 03, 2021 FINDINGS: The heart size and mediastinal contours are within normal limits. There is marked severity calcification  and tortuosity of the thoracic aorta. Both lungs are clear. Chronic left-sided rib fractures are seen. The visualized skeletal structures are otherwise unremarkable. IMPRESSION: 1. No active cardiopulmonary disease. Electronically Signed   By: Aram Candela M.D.   On: 02/23/2022 21:22   DG Hips Bilat W or Wo Pelvis 2 Views  Result Date: 02/23/2022 CLINICAL DATA:  Fall EXAM: DG HIP (WITH OR WITHOUT PELVIS) 2V BILAT COMPARISON:  02/11/2015 FINDINGS: Postoperative changes in the right femur. Hip joints are symmetric. No acute bony abnormality. Specifically, no fracture, subluxation, or dislocation. IMPRESSION: No acute bony abnormality. Electronically Signed   By: Charlett Nose M.D.   On: 02/23/2022 20:04   DG Sacrum/Coccyx  Result Date: 02/23/2022 CLINICAL DATA:  Fall EXAM: SACRUM AND COCCYX - 2+ VIEW COMPARISON:  11/03/2021 FINDINGS: Postoperative changes in the right femur. Angulation noted in the lower sacrum/upper coccyx appears chronic and may be related to congenital deformity or old injury. No definite acute fracture. Hip joints are symmetric. IMPRESSION: Angulation in the lower sacrum/upper coccyx, favor congenital deformity versus old injury. No definite acute fracture. Electronically Signed   By: Charlett Nose M.D.   On: 02/23/2022 20:02    Procedures Procedures    Medications Ordered in ED Medications  acetaminophen (TYLENOL) tablet 650 mg (650 mg Oral Given 02/23/22 1934)  lactated ringers bolus 500 mL (500 mLs Intravenous New Bag/Given 02/23/22 2009)  aspirin chewable tablet 324 mg (324 mg Oral Given 02/23/22 2120)    ED Course/ Medical Decision Making/ A&P                           Medical Decision Making Amount and/or Complexity of Data Reviewed Independent Historian:     Details: Neice Labs: ordered.    Details: Creatinine elevated, consistent with acute kidney injury.  First troponin at 62 is elevated, no baseline. Radiology: ordered and independent interpretation performed.     Details: No hip fracture or dislocation. ECG/medicine tests: independent interpretation performed.    Details: Nonspecific ST changes similar to baseline, no acute ischemia  Risk OTC drugs. Decision regarding hospitalization.   Patient is generally weak.  No significant trauma seen.  She is alert and oriented and no obvious head injury so do not think CT head imaging needed.  No focal weakness to suggest stroke.  Labs are consistent with an acute kidney injury.  Troponin sent given her age and generalized weakness and is nonspecific.  Could be hypertension related which is improving and renal disease related, but I think she will definitely need a second.  I think ACS is probably less likely.  Overall she will need admission and continued hydration.  Discussed with Dr. Emmit Pomfret for admission.       Final Clinical Impression(s) / ED Diagnoses Final diagnoses:  Acute kidney injury [facility])    Rx / DC Orders ED Discharge Orders     None         Pricilla Loveless, MD 02/23/22 2203

## 2022-02-24 DIAGNOSIS — M6282 Rhabdomyolysis: Secondary | ICD-10-CM

## 2022-02-24 DIAGNOSIS — E876 Hypokalemia: Secondary | ICD-10-CM

## 2022-02-24 DIAGNOSIS — R5381 Other malaise: Secondary | ICD-10-CM

## 2022-02-24 DIAGNOSIS — R778 Other specified abnormalities of plasma proteins: Secondary | ICD-10-CM

## 2022-02-24 DIAGNOSIS — D649 Anemia, unspecified: Secondary | ICD-10-CM

## 2022-02-24 DIAGNOSIS — N179 Acute kidney failure, unspecified: Secondary | ICD-10-CM | POA: Diagnosis not present

## 2022-02-24 LAB — COMPREHENSIVE METABOLIC PANEL
ALT: 24 U/L (ref 0–44)
AST: 41 U/L (ref 15–41)
Albumin: 3.3 g/dL — ABNORMAL LOW (ref 3.5–5.0)
Alkaline Phosphatase: 78 U/L (ref 38–126)
Anion gap: 7 (ref 5–15)
BUN: 44 mg/dL — ABNORMAL HIGH (ref 8–23)
CO2: 26 mmol/L (ref 22–32)
Calcium: 9.2 mg/dL (ref 8.9–10.3)
Chloride: 104 mmol/L (ref 98–111)
Creatinine, Ser: 1.69 mg/dL — ABNORMAL HIGH (ref 0.44–1.00)
GFR, Estimated: 29 mL/min — ABNORMAL LOW (ref >60)
Glucose, Bld: 99 mg/dL (ref 70–99)
Potassium: 3.4 mmol/L — ABNORMAL LOW (ref 3.5–5.1)
Sodium: 137 mmol/L (ref 135–145)
Total Bilirubin: 0.6 mg/dL (ref 0.3–1.2)
Total Protein: 6.4 g/dL — ABNORMAL LOW (ref 6.5–8.1)

## 2022-02-24 LAB — CBC
HCT: 28.8 % — ABNORMAL LOW (ref 36.0–46.0)
Hemoglobin: 10.1 g/dL — ABNORMAL LOW (ref 12.0–15.0)
MCH: 34.9 pg — ABNORMAL HIGH (ref 26.0–34.0)
MCHC: 35.1 g/dL (ref 30.0–36.0)
MCV: 99.7 fL (ref 80.0–100.0)
Platelets: 217 10*3/uL (ref 150–400)
RBC: 2.89 MIL/uL — ABNORMAL LOW (ref 3.87–5.11)
RDW: 12.1 % (ref 11.5–15.5)
WBC: 7.9 10*3/uL (ref 4.0–10.5)
nRBC: 0 % (ref 0.0–0.2)

## 2022-02-24 LAB — LIPID PANEL
Cholesterol: 147 mg/dL (ref 0–200)
HDL: 48 mg/dL (ref >40)
LDL Cholesterol: 86 mg/dL (ref 0–99)
Total CHOL/HDL Ratio: 3.1 RATIO
Triglycerides: 63 mg/dL (ref <150)
VLDL: 13 mg/dL (ref 0–40)

## 2022-02-24 LAB — T4, FREE: Free T4: 1.11 ng/dL (ref 0.61–1.12)

## 2022-02-24 LAB — TSH: TSH: 6.32 u[IU]/mL — ABNORMAL HIGH (ref 0.350–4.500)

## 2022-02-24 LAB — TROPONIN I (HIGH SENSITIVITY)
Troponin I (High Sensitivity): 61 ng/L — ABNORMAL HIGH (ref <18)
Troponin I (High Sensitivity): 45 ng/L — ABNORMAL HIGH (ref <18)

## 2022-02-24 LAB — MAGNESIUM: Magnesium: 2 mg/dL (ref 1.7–2.4)

## 2022-02-24 LAB — CK: Total CK: 315 U/L — ABNORMAL HIGH (ref 38–234)

## 2022-02-24 MED ORDER — METOPROLOL SUCCINATE ER 25 MG PO TB24
25.0000 mg | ORAL_TABLET | Freq: Every day | ORAL | Status: DC
  Administered 2022-02-24 – 2022-02-27 (×4): 25 mg via ORAL
  Filled 2022-02-24 (×4): qty 1

## 2022-02-24 MED ORDER — SENNOSIDES-DOCUSATE SODIUM 8.6-50 MG PO TABS
1.0000 | ORAL_TABLET | Freq: Every evening | ORAL | Status: DC | PRN
  Administered 2022-02-24: 1 via ORAL
  Filled 2022-02-24: qty 1

## 2022-02-24 MED ORDER — SODIUM CHLORIDE 0.9% FLUSH
3.0000 mL | Freq: Two times a day (BID) | INTRAVENOUS | Status: DC
  Administered 2022-02-24 – 2022-02-27 (×4): 3 mL via INTRAVENOUS

## 2022-02-24 MED ORDER — ONDANSETRON HCL 4 MG PO TABS: 4.0000 mg | ORAL_TABLET | Freq: Four times a day (QID) | ORAL | Status: DC | PRN

## 2022-02-24 MED ORDER — ENOXAPARIN SODIUM 30 MG/0.3ML IJ SOSY
30.0000 mg | PREFILLED_SYRINGE | INTRAMUSCULAR | Status: DC
  Administered 2022-02-24 – 2022-02-27 (×4): 30 mg via SUBCUTANEOUS
  Filled 2022-02-24 (×4): qty 0.3

## 2022-02-24 MED ORDER — SERTRALINE HCL 25 MG PO TABS
25.0000 mg | ORAL_TABLET | Freq: Every day | ORAL | Status: DC
  Administered 2022-02-24 – 2022-02-27 (×4): 25 mg via ORAL
  Filled 2022-02-24 (×4): qty 1

## 2022-02-24 MED ORDER — TRAZODONE HCL 50 MG PO TABS
25.0000 mg | ORAL_TABLET | Freq: Every evening | ORAL | Status: DC | PRN
  Administered 2022-02-24 – 2022-02-26 (×2): 25 mg via ORAL
  Filled 2022-02-24 (×2): qty 1

## 2022-02-24 MED ORDER — ONDANSETRON HCL 4 MG/2ML IJ SOLN: 4.0000 mg | Freq: Four times a day (QID) | INTRAMUSCULAR | Status: DC | PRN

## 2022-02-24 MED ORDER — ACETAMINOPHEN 650 MG RE SUPP: 650.0000 mg | Freq: Four times a day (QID) | RECTAL | Status: DC | PRN

## 2022-02-24 MED ORDER — BISACODYL 5 MG PO TBEC: 5.0000 mg | DELAYED_RELEASE_TABLET | Freq: Every day | ORAL | Status: DC | PRN

## 2022-02-24 MED ORDER — HYDRALAZINE HCL 20 MG/ML IJ SOLN
5.0000 mg | Freq: Four times a day (QID) | INTRAMUSCULAR | Status: DC | PRN
  Administered 2022-02-24 – 2022-02-27 (×3): 5 mg via INTRAVENOUS
  Filled 2022-02-24 (×3): qty 1

## 2022-02-24 MED ORDER — ADULT MULTIVITAMIN W/MINERALS CH
1.0000 | ORAL_TABLET | Freq: Every day | ORAL | Status: DC
  Administered 2022-02-24 – 2022-02-27 (×4): 1 via ORAL
  Filled 2022-02-24 (×4): qty 1

## 2022-02-24 MED ORDER — SODIUM CHLORIDE 0.9 % IV SOLN: INTRAVENOUS | Status: DC

## 2022-02-24 MED ORDER — PNEUMOCOCCAL 20-VAL CONJ VACC 0.5 ML IM SUSY
0.5000 mL | PREFILLED_SYRINGE | INTRAMUSCULAR | Status: DC
  Filled 2022-02-24 (×3): qty 0.5

## 2022-02-24 MED ORDER — POTASSIUM CHLORIDE 10 MEQ/100ML IV SOLN
10.0000 meq | INTRAVENOUS | Status: AC
  Administered 2022-02-24 (×4): 10 meq via INTRAVENOUS
  Filled 2022-02-24 (×4): qty 100

## 2022-02-24 MED ORDER — ASPIRIN 81 MG PO TBEC
81.0000 mg | DELAYED_RELEASE_TABLET | Freq: Every day | ORAL | Status: DC
  Administered 2022-02-24 – 2022-02-27 (×4): 81 mg via ORAL
  Filled 2022-02-24 (×4): qty 1

## 2022-02-24 MED ORDER — HYDROCODONE-ACETAMINOPHEN 5-325 MG PO TABS: 1.0000 | ORAL_TABLET | Freq: Four times a day (QID) | ORAL | Status: DC | PRN

## 2022-02-24 MED ORDER — MORPHINE SULFATE (PF) 2 MG/ML IV SOLN: 1.0000 mg | Freq: Four times a day (QID) | INTRAVENOUS | Status: DC | PRN

## 2022-02-24 MED ORDER — ACETAMINOPHEN 325 MG PO TABS
650.0000 mg | ORAL_TABLET | Freq: Four times a day (QID) | ORAL | Status: DC | PRN
  Administered 2022-02-25 – 2022-02-26 (×2): 650 mg via ORAL
  Filled 2022-02-24 (×2): qty 2

## 2022-02-24 MED ORDER — POTASSIUM CHLORIDE CRYS ER 20 MEQ PO TBCR
40.0000 meq | EXTENDED_RELEASE_TABLET | Freq: Once | ORAL | Status: DC
Start: 2022-02-24 — End: 2022-02-24
  Filled 2022-02-24: qty 2

## 2022-02-24 MED ORDER — FUROSEMIDE 20 MG PO TABS
10.0000 mg | ORAL_TABLET | Freq: Every day | ORAL | Status: DC
  Administered 2022-02-24 – 2022-02-26 (×3): 10 mg via ORAL
  Filled 2022-02-24 (×3): qty 1

## 2022-02-24 NOTE — Hospital Course (Signed)
Alicia Schwartz is an 88 yo female with PMH HTN, SVT who presented after a fall at home and being too weak to get off the floor.  She was able to state that she accidentally fell off of her bed and after being on the floor for unknown amount of time she finally called her niece for help due to not being able to get up. She was then brought to the ER for further evaluation.  Work-up revealed AKI, mild rhabdomyolysis, and generalized weakness.  She was started on fluids and admitted for further work-up and PT evaluation.

## 2022-02-24 NOTE — Assessment & Plan Note (Signed)
-   Etiology presumed from being down on the floor after falling out of bed - Initial CK 669 and is downtrending with fluids

## 2022-02-24 NOTE — Progress Notes (Signed)
Progress Note    Alicia Schwartz   PZW:[id number]  DOB: September 12, 1930  DOA: 02/23/2022     1 PCP: Linus Galas, NP  Initial CC: Found down at home  Hospital Course: Ms. Dungan is an 88 yo female with PMH HTN, SVT who presented after a fall at home and being too weak to get off the floor.  She was able to state that she accidentally fell off of her bed and after being on the floor for unknown amount of time she finally called her niece for help due to not being able to get up. She was then brought to the ER for further evaluation.  Work-up revealed AKI, mild rhabdomyolysis, and generalized weakness.  She was started on fluids and admitted for further work-up and PT evaluation.  Interval History:  Resting in bed comfortably when seen.  She is very hard of hearing due to hearing aids low on battery. She denies any chest pain or symptoms in general.  Explained to her that we are treating for dehydration. I also called and spoke with her niece and her son.  Son confirmed DNR/DNI status which I updated in the computer and signed goldenrod. Tentative plan is for pursuing rehab prior to discharge per family request.  Assessment and Plan: * AKI (acute kidney injury) (HCC) - baseline creatinine ~ 0.9 - 1 - patient presents with increase in creat >0.3 mg/dL above baseline, creat increase >1.5x baseline presumed to have occurred within past 7 days PTA -Suspect due to prerenal/volume depletion from decreased intake and being on the floor.  CK is not high enough to cause pigment nephropathy at this time - Creatinine 1.76 on admission - Continue fluids - Continue trending BMP   Rhabdomyolysis - Etiology presumed from being down on the floor after falling out of bed - Initial CK 669 and is downtrending with fluids  Hyponatremia-resolved as of 02/24/2022 - Mild and due to hypovolemia -Continue fluids  Physical deconditioning - Patient too weak to get up after falling out of bed - Family reports  that she did fairly well with physical therapy last time she went but upon returning home she did not stay as active and again became more deconditioned - Family is interested in pursuing rehab again at discharge  Hypokalemia - Replete as needed  Normocytic anemia - Baseline appears to be around 11 g/dL - Currently around baseline  HTN (hypertension) - Continue current regimen  Elevated troponin-resolved as of 02/24/2022 - Suspected demand and some possible contribution from AKI - Patient chest pain-free - No further work-up - Troponin has been trended and is downtrending as well   Old records reviewed in assessment of this patient  Antimicrobials:   DVT prophylaxis:  enoxaparin (LOVENOX) injection 30 mg Start: 02/24/22 1000 SCDs Start: 02/24/22 0009   Code Status:   Code Status: DNR  Mobility Assessment (last 72 hours)     Mobility Assessment     Row Name 02/24/22 0015           Does patient have an order for bedrest or is patient medically unstable No - Continue assessment       What is the highest level of mobility based on the progressive mobility assessment? Level 1 (Bedfast) - Unable to balance while sitting on edge of bed       Is the above level different from baseline mobility prior to current illness? Yes - Recommend PT order  Barriers to discharge: none Disposition Plan:  SNF Status is: Inpt  Objective: Blood pressure (!) 157/52, pulse 67, temperature (!) 97.4 F (36.3 C), resp. rate 16, height 4\' 11"  (1.499 m), weight 46.9 kg, SpO2 97 %.  Examination:  Physical Exam Constitutional:      Comments: Frail elderly woman lying in bed in no distress with very hard of hearing  HENT:     Head: Normocephalic and atraumatic.     Mouth/Throat:     Mouth: Mucous membranes are moist.  Eyes:     Extraocular Movements: Extraocular movements intact.  Cardiovascular:     Rate and Rhythm: Normal rate and regular rhythm.  Pulmonary:      Effort: Pulmonary effort is normal.     Breath sounds: Normal breath sounds.  Abdominal:     General: Bowel sounds are normal. There is no distension.     Palpations: Abdomen is soft.     Tenderness: There is no abdominal tenderness.  Musculoskeletal:        General: Normal range of motion.     Cervical back: Normal range of motion and neck supple.  Skin:    General: Skin is warm and dry.  Neurological:     Mental Status: Mental status is at baseline.  Psychiatric:        Mood and Affect: Mood normal.        Behavior: Behavior normal.      Consultants:    Procedures:    Data Reviewed: Results for orders placed or performed during the hospital encounter of 02/23/22 (from the past 24 hour(s))  CBC with Differential     Status: Abnormal   Collection Time: 02/23/22  5:36 PM  Result Value Ref Range   WBC 10.1 4.0 - 10.5 K/uL   RBC 3.27 (L) 3.87 - 5.11 MIL/uL   Hemoglobin 11.5 (L) 12.0 - 15.0 g/dL   HCT 04/25/22 (L) 72.5 - 36.6 %   MCV 98.8 80.0 - 100.0 fL   MCH 35.2 (H) 26.0 - 34.0 pg   MCHC 35.6 30.0 - 36.0 g/dL   RDW 44.0 34.7 - 42.5 %   Platelets 263 150 - 400 K/uL   nRBC 0.0 0.0 - 0.2 %   Neutrophils Relative % 78 %   Neutro Abs 7.8 (H) 1.7 - 7.7 K/uL   Lymphocytes Relative 13 %   Lymphs Abs 1.3 0.7 - 4.0 K/uL   Monocytes Relative 7 %   Monocytes Absolute 0.7 0.1 - 1.0 K/uL   Eosinophils Relative 1 %   Eosinophils Absolute 0.1 0.0 - 0.5 K/uL   Basophils Relative 0 %   Basophils Absolute 0.0 0.0 - 0.1 K/uL   Immature Granulocytes 1 %   Abs Immature Granulocytes 0.05 0.00 - 0.07 K/uL  Comprehensive metabolic panel     Status: Abnormal   Collection Time: 02/23/22  5:36 PM  Result Value Ref Range   Sodium 129 (L) 135 - 145 mmol/L   Potassium 3.4 (L) 3.5 - 5.1 mmol/L   Chloride 94 (L) 98 - 111 mmol/L   CO2 24 22 - 32 mmol/L   Glucose, Bld 116 (H) 70 - 99 mg/dL   BUN 50 (H) 8 - 23 mg/dL   Creatinine, Ser 04/25/22 (H) 0.44 - 1.00 mg/dL   Calcium 9.3 8.9 - 3.87 mg/dL    Total Protein 7.5 6.5 - 8.1 g/dL   Albumin 4.2 3.5 - 5.0 g/dL   AST 53 (H) 15 - 41 U/L   ALT  29 0 - 44 U/L   Alkaline Phosphatase 93 38 - 126 U/L   Total Bilirubin 0.6 0.3 - 1.2 mg/dL   GFR, Estimated 27 (L) >60 mL/min   Anion gap 11 5 - 15  CK     Status: Abnormal   Collection Time: 02/23/22  5:36 PM  Result Value Ref Range   Total CK 669 (H) 38 - 234 U/L  Troponin I (High Sensitivity)     Status: Abnormal   Collection Time: 02/23/22  8:10 PM  Result Value Ref Range   Troponin I (High Sensitivity) 62 (H) <18 ng/L  Troponin I (High Sensitivity)     Status: Abnormal   Collection Time: 02/23/22  9:22 PM  Result Value Ref Range   Troponin I (High Sensitivity) 54 (H) <18 ng/L  Magnesium     Status: None   Collection Time: 02/24/22 12:42 AM  Result Value Ref Range   Magnesium 2.0 1.7 - 2.4 mg/dL  Comprehensive metabolic panel     Status: Abnormal   Collection Time: 02/24/22 12:42 AM  Result Value Ref Range   Sodium 137 135 - 145 mmol/L   Potassium 3.4 (L) 3.5 - 5.1 mmol/L   Chloride 104 98 - 111 mmol/L   CO2 26 22 - 32 mmol/L   Glucose, Bld 99 70 - 99 mg/dL   BUN 44 (H) 8 - 23 mg/dL   Creatinine, Ser 7.65 (H) 0.44 - 1.00 mg/dL   Calcium 9.2 8.9 - 46.5 mg/dL   Total Protein 6.4 (L) 6.5 - 8.1 g/dL   Albumin 3.3 (L) 3.5 - 5.0 g/dL   AST 41 15 - 41 U/L   ALT 24 0 - 44 U/L   Alkaline Phosphatase 78 38 - 126 U/L   Total Bilirubin 0.6 0.3 - 1.2 mg/dL   GFR, Estimated 29 (L) >60 mL/min   Anion gap 7 5 - 15  CBC     Status: Abnormal   Collection Time: 02/24/22 12:42 AM  Result Value Ref Range   WBC 7.9 4.0 - 10.5 K/uL   RBC 2.89 (L) 3.87 - 5.11 MIL/uL   Hemoglobin 10.1 (L) 12.0 - 15.0 g/dL   HCT 03.5 (L) 46.5 - 68.1 %   MCV 99.7 80.0 - 100.0 fL   MCH 34.9 (H) 26.0 - 34.0 pg   MCHC 35.1 30.0 - 36.0 g/dL   RDW 27.5 17.0 - 01.7 %   Platelets 217 150 - 400 K/uL   nRBC 0.0 0.0 - 0.2 %  Troponin I (High Sensitivity)     Status: Abnormal   Collection Time: 02/24/22 12:42 AM   Result Value Ref Range   Troponin I (High Sensitivity) 61 (H) <18 ng/L  TSH     Status: Abnormal   Collection Time: 02/24/22 12:42 AM  Result Value Ref Range   TSH 6.320 (H) 0.350 - 4.500 uIU/mL  Lipid panel     Status: None   Collection Time: 02/24/22 12:42 AM  Result Value Ref Range   Cholesterol 147 0 - 200 mg/dL   Triglycerides 63 <494 mg/dL   HDL 48 >49 mg/dL   Total CHOL/HDL Ratio 3.1 RATIO   VLDL 13 0 - 40 mg/dL   LDL Cholesterol 86 0 - 99 mg/dL  Troponin I (High Sensitivity)     Status: Abnormal   Collection Time: 02/24/22  6:13 AM  Result Value Ref Range   Troponin I (High Sensitivity) 45 (H) <18 ng/L  CK     Status: Abnormal  Collection Time: 02/24/22  8:19 AM  Result Value Ref Range   Total CK 315 (H) 38 - 234 U/L    I have Reviewed nursing notes, Vitals, and Lab results since pt's last encounter. Pertinent lab results : see above I have ordered test including BMP, CBC, Mg I have reviewed the last note from staff over past 24 hours I have discussed pt's care plan and test results with nursing staff, case manager   LOS: 1 day   Lewie Chamber, MD [facility] 02/24/2022, 12:15 PM

## 2022-02-24 NOTE — Assessment & Plan Note (Addendum)
-   baseline creatinine ~ 0.9 - 1 - patient presents with increase in creat >0.3 mg/dL above baseline, creat increase >1.5x baseline presumed to have occurred within past 7 days PTA -Suspect due to prerenal/volume depletion from decreased intake and being on the floor.  CK is not high enough to cause pigment nephropathy at this time - Creatinine 1.76 on admission - creat improved to 1.25 by discharge

## 2022-02-24 NOTE — Assessment & Plan Note (Signed)
-   Suspected demand and some possible contribution from AKI - Patient chest pain-free - No further work-up - Troponin has been trended and is downtrending as well

## 2022-02-24 NOTE — Assessment & Plan Note (Addendum)
-   Patient too weak to get up after falling out of bed - Family reports that she did fairly well with physical therapy last time she went but upon returning home she did not stay as active and again became more deconditioned - family and patient accepted SNF bed offer

## 2022-02-24 NOTE — Assessment & Plan Note (Signed)
-   Mild and due to hypovolemia -Continue fluids

## 2022-02-24 NOTE — Evaluation (Signed)
Physical Therapy Evaluation Patient Details Name: Alicia Schwartz MRN: [id number] DOB: 08/31/1930 Today's Date: 02/24/2022  History of Present Illness  88 yo female presents to ED with fall, generalized weakness, AKI. PMH HTN, SVT, R femur fx with history of IMN 2016, vertebroplasty 2020.  Clinical Impression   Pt presents with generalized weakness, LE pain s/p fall, impaired balance with history of multiple falls, and impaired activity tolerance. Pt to benefit from acute PT to address deficits. Pt requiring mod assist for stand and pivot to reach recliner, pt incontinent of urine requiring max assist for clean up. PT recommending SNF level of care post-acutely, pt lives alone at baseline. PT to progress mobility as tolerated, and will continue to follow acutely.         Recommendations for follow up therapy are one component of a multi-disciplinary discharge planning process, led by the attending physician.  Recommendations may be updated based on patient status, additional functional criteria and insurance authorization.  Follow Up Recommendations Skilled nursing-short term rehab (<3 hours/day) Can patient physically be transported by private vehicle: No    Assistance Recommended at Discharge Frequent or constant Supervision/Assistance  Patient can return home with the following  A lot of help with walking and/or transfers;A lot of help with bathing/dressing/bathroom;Assistance with cooking/housework;Direct supervision/assist for medications management;Direct supervision/assist for financial management;Assist for transportation;Help with stairs or ramp for entrance    Equipment Recommendations None recommended by PT  Recommendations for Other Services       Functional Status Assessment Patient has had a recent decline in their functional status and demonstrates the ability to make significant improvements in function in a reasonable and predictable amount of time.     Precautions /  Restrictions Precautions Precautions: Fall Restrictions Weight Bearing Restrictions: No      Mobility  Bed Mobility Overal bed mobility: Needs Assistance Bed Mobility: Supine to Sit     Supine to sit: HOB elevated, Max assist     General bed mobility comments: max assist for trunk elevation, LE translation to EOB, and boost to EOB with assist of bed pad. Very increased time    Transfers Overall transfer level: Needs assistance Equipment used: Rolling walker (2 wheels) Transfers: Sit to/from Stand, Bed to chair/wheelchair/BSC Sit to Stand: Mod assist Stand pivot transfers: Mod assist         General transfer comment: assist for power up, rise, steadying. pt stating "I can't do it" even though pt continues stepping to chair    Ambulation/Gait                  Stairs            Wheelchair Mobility    Modified Rankin (Stroke Patients Only)       Balance Overall balance assessment: Needs assistance, History of Falls Sitting-balance support: No upper extremity supported, Feet supported Sitting balance-Leahy Scale: Fair     Standing balance support: Bilateral upper extremity supported, During functional activity Standing balance-Leahy Scale: Poor                               Pertinent Vitals/Pain Pain Assessment Pain Assessment: Faces Faces Pain Scale: Hurts even more Pain Location: LEs Pain Descriptors / Indicators: Sore, Discomfort Pain Intervention(s): Monitored during session, Limited activity within patient's tolerance, Repositioned    Home Living Family/patient expects to be discharged to:: Private residence Living Arrangements: Alone Available Help at Discharge: Family;Available PRN/intermittently Type of  Home: Mobile home Home Access: Stairs to enter Entrance Stairs-Rails: Doctor, general practice of Steps: 5   Home Layout: One level Home Equipment: Agricultural consultant (2 wheels) Additional Comments: pt states she  does not have a w/c, per chart review from PT note x3 months ago pt does have one    Prior Function Prior Level of Function : History of Falls (last six months);Independent/Modified Independent             Mobility Comments: pt reports her niece checks on her every once in a while, son lives in [place]. Pt reports 3 recent falls       Hand Dominance   Dominant Hand: Right    Extremity/Trunk Assessment   Upper Extremity Assessment Upper Extremity Assessment: Defer to OT evaluation    Lower Extremity Assessment Lower Extremity Assessment: Generalized weakness RLE: Unable to fully assess due to pain LLE: Unable to fully assess due to pain    Cervical / Trunk Assessment Cervical / Trunk Assessment: Normal  Communication   Communication: HOH  Cognition Arousal/Alertness: Awake/alert Behavior During Therapy: Anxious Overall Cognitive Status: No family/caregiver present to determine baseline cognitive functioning                                 General Comments: Pt tearful upon PT arrival, as pt was shouting help and asking for someone to come sit in the room with her. Pt also tearful upon PT exit, states "don't leave". Pt A&Ox4, other cognition is hard to assess given severity of hearing loss and pt's hearing aide batteries are dead.        General Comments      Exercises     Assessment/Plan    PT Assessment Patient needs continued PT services  PT Problem List Decreased strength;Decreased mobility;Decreased activity tolerance;Decreased balance;Decreased knowledge of use of DME;Pain;Decreased safety awareness       PT Treatment Interventions DME instruction;Therapeutic activities;Gait training;Therapeutic exercise;Patient/family education;Balance training;Functional mobility training;Neuromuscular re-education    PT Goals (Current goals can be found in the Care Plan section)  Acute Rehab PT Goals PT Goal Formulation: With patient Time For Goal  Achievement: 03/10/22 Potential to Achieve Goals: Good    Frequency Min 2X/week     Co-evaluation               AM-PAC PT "6 Clicks" Mobility  Outcome Measure Help needed turning from your back to your side while in a flat bed without using bedrails?: A Lot Help needed moving from lying on your back to sitting on the side of a flat bed without using bedrails?: A Lot Help needed moving to and from a bed to a chair (including a wheelchair)?: A Lot Help needed standing up from a chair using your arms (e.g., wheelchair or bedside chair)?: A Lot Help needed to walk in hospital room?: Total Help needed climbing 3-5 steps with a railing? : Total 6 Click Score: 10    End of Session   Activity Tolerance: Patient tolerated treatment well Patient left: in chair;with chair alarm set;with call bell/phone within reach;with nursing/sitter in room Nurse Communication: Mobility status PT Visit Diagnosis: Other abnormalities of gait and mobility (R26.89);Muscle weakness (generalized) (M62.81)    Time: 1120-1140 PT Time Calculation (min) (ACUTE ONLY): 20 min   Charges:   PT Evaluation $PT Eval Low Complexity: 1 Low         Dietrich Ke S, PT DPT Acute Rehabilitation Services  Pager [phone]  Office [phone]   Truddie Coco 02/24/2022, 3:11 PM

## 2022-02-24 NOTE — Assessment & Plan Note (Signed)
Continue current regimen

## 2022-02-24 NOTE — Assessment & Plan Note (Signed)
-   Baseline appears to be around 11 g/dL - Currently around baseline

## 2022-02-24 NOTE — Assessment & Plan Note (Signed)
-   Replete as needed 

## 2022-02-25 DIAGNOSIS — R5381 Other malaise: Secondary | ICD-10-CM

## 2022-02-25 DIAGNOSIS — N179 Acute kidney failure, unspecified: Secondary | ICD-10-CM | POA: Diagnosis not present

## 2022-02-25 LAB — BASIC METABOLIC PANEL
Anion gap: 6 (ref 5–15)
BUN: 28 mg/dL — ABNORMAL HIGH (ref 8–23)
CO2: 22 mmol/L (ref 22–32)
Calcium: 8.4 mg/dL — ABNORMAL LOW (ref 8.9–10.3)
Chloride: 104 mmol/L (ref 98–111)
Creatinine, Ser: 1.13 mg/dL — ABNORMAL HIGH (ref 0.44–1.00)
GFR, Estimated: 47 mL/min — ABNORMAL LOW (ref >60)
Glucose, Bld: 95 mg/dL (ref 70–99)
Potassium: 3.6 mmol/L (ref 3.5–5.1)
Sodium: 132 mmol/L — ABNORMAL LOW (ref 135–145)

## 2022-02-25 LAB — CBC WITH DIFFERENTIAL/PLATELET
Abs Immature Granulocytes: 0.02 10*3/uL (ref 0.00–0.07)
Basophils Absolute: 0 10*3/uL (ref 0.0–0.1)
Basophils Relative: 0 %
Eosinophils Absolute: 0.2 10*3/uL (ref 0.0–0.5)
Eosinophils Relative: 4 %
HCT: 27.4 % — ABNORMAL LOW (ref 36.0–46.0)
Hemoglobin: 9.4 g/dL — ABNORMAL LOW (ref 12.0–15.0)
Immature Granulocytes: 0 %
Lymphocytes Relative: 18 %
Lymphs Abs: 1.1 10*3/uL (ref 0.7–4.0)
MCH: 35.1 pg — ABNORMAL HIGH (ref 26.0–34.0)
MCHC: 34.3 g/dL (ref 30.0–36.0)
MCV: 102.2 fL — ABNORMAL HIGH (ref 80.0–100.0)
Monocytes Absolute: 0.7 10*3/uL (ref 0.1–1.0)
Monocytes Relative: 12 %
Neutro Abs: 3.8 10*3/uL (ref 1.7–7.7)
Neutrophils Relative %: 66 %
Platelets: 199 10*3/uL (ref 150–400)
RBC: 2.68 MIL/uL — ABNORMAL LOW (ref 3.87–5.11)
RDW: 12.3 % (ref 11.5–15.5)
WBC: 5.8 10*3/uL (ref 4.0–10.5)
nRBC: 0 % (ref 0.0–0.2)

## 2022-02-25 LAB — T3: T3, Total: 66 ng/dL — ABNORMAL LOW (ref 71–180)

## 2022-02-25 LAB — MAGNESIUM: Magnesium: 1.9 mg/dL (ref 1.7–2.4)

## 2022-02-25 NOTE — Evaluation (Signed)
Occupational Therapy Evaluation Patient Details Name: Alicia Schwartz MRN: [id number] DOB: 03-Oct-1930 Today's Date: 02/25/2022   History of Present Illness Patient is a 88 year old  female who presented to the hospital after a fall. patient was admitted with generalized weakness,and AKI. PMH: HTN, SVT, R femur fx with history of IMN 2016, vertebroplasty 2020.   Clinical Impression   Patient isa 88 year old female who was admitted for above. Patient was living at home alone prior level. Patient was noted to have decreased functional activity tolerance, decreased endurance, decreased standing balance, decreased safety awareness, and decreased knowledge of AD/AE impacting participation in ADLs. Patient would continue to benefit from skilled OT services at this time while admitted and after d/c to address noted deficits in order to improve overall safety and independence in ADLs.        Recommendations for follow up therapy are one component of a multi-disciplinary discharge planning process, led by the attending physician.  Recommendations may be updated based on patient status, additional functional criteria and insurance authorization.   Follow Up Recommendations  Skilled nursing-short term rehab (<3 hours/day)    Assistance Recommended at Discharge Frequent or constant Supervision/Assistance  Patient can return home with the following A little help with walking and/or transfers;Assistance with cooking/housework;Direct supervision/assist for financial management;Assist for transportation;Help with stairs or ramp for entrance;Direct supervision/assist for medications management;A lot of help with bathing/dressing/bathroom    Functional Status Assessment  Patient has had a recent decline in their functional status and demonstrates the ability to make significant improvements in function in a reasonable and predictable amount of time.  Equipment Recommendations  None recommended by OT     Recommendations for Other Services       Precautions / Restrictions Precautions Precautions: Fall Restrictions Weight Bearing Restrictions: No      Mobility Bed Mobility Overal bed mobility: Needs Assistance Bed Mobility: Supine to Sit     Supine to sit: HOB elevated, Max assist     General bed mobility comments: max assist for trunk elevation, LE translation to EOB, and boost to EOB with assist of bed pad. Very increased time    Transfers                          Balance Overall balance assessment: Needs assistance, History of Falls Sitting-balance support: No upper extremity supported, Feet supported Sitting balance-Leahy Scale: Fair     Standing balance support: Bilateral upper extremity supported, During functional activity Standing balance-Leahy Scale: Poor                             ADL either performed or assessed with clinical judgement   ADL Overall ADL's : Needs assistance/impaired Eating/Feeding: Set up;Sitting   Grooming: Wash/dry face;Sitting Grooming Details (indicate cue type and reason): in recliner with MI Upper Body Bathing: Minimal assistance;Sitting   Lower Body Bathing: Moderate assistance;Sitting/lateral leans Lower Body Bathing Details (indicate cue type and reason): with patient noted to have anxiousness with attempting to wipe legs after incontinence episode. patient was educated on getting what she could reach and therapist helping with what she could not. patient was able to wash thighs seated in recliner with increased time. Upper Body Dressing : Minimal assistance;Sitting   Lower Body Dressing: Moderate assistance;Sit to/from stand;Sitting/lateral leans   Toilet Transfer: Moderate assistance;Rolling walker (2 wheels) Toilet Transfer Details (indicate cue type and reason): from edge of  bed to recliner with patient needing BLE blocked upon inital standing. needed cues to turn completely prior to attempting to sit  down. Toileting- Clothing Manipulation and Hygiene: Maximal assistance;Sit to/from stand               Vision Baseline Vision/History: 1 Wears glasses Patient Visual Report: No change from baseline       Perception     Praxis      Pertinent Vitals/Pain Pain Assessment Pain Assessment: Faces Faces Pain Scale: Hurts little more Pain Location: LEs Pain Descriptors / Indicators: Sore, Discomfort Pain Intervention(s): Limited activity within patient's tolerance, Monitored during session, Repositioned     Hand Dominance Right   Extremity/Trunk Assessment Upper Extremity Assessment Upper Extremity Assessment: Generalized weakness   Lower Extremity Assessment Lower Extremity Assessment: Defer to PT evaluation   Cervical / Trunk Assessment Cervical / Trunk Assessment: Normal   Communication Communication Communication: HOH   Cognition Arousal/Alertness: Awake/alert Behavior During Therapy: Anxious Overall Cognitive Status: No family/caregiver present to determine baseline cognitive functioning                                 General Comments: patient was hard to assess cognition with patient hard of hearing with hearing aids in place.     General Comments       Exercises     Shoulder Instructions      Home Living Family/patient expects to be discharged to:: Private residence Living Arrangements: Alone Available Help at Discharge: Family;Available PRN/intermittently Type of Home: Mobile home Home Access: Stairs to enter Entrance Stairs-Number of Steps: 5 Entrance Stairs-Rails: Right;Left Home Layout: One level               Home Equipment: Agricultural consultant (2 wheels)          Prior Functioning/Environment Prior Level of Function : History of Falls (last six months);Independent/Modified Independent             Mobility Comments: pt reports her niece checks on her every once in a while, son lives in [place]. Pt reports 3 recent  falls          OT Problem List: Decreased activity tolerance;Impaired balance (sitting and/or standing);Decreased safety awareness;Decreased knowledge of precautions;Decreased knowledge of use of DME or AE      OT Treatment/Interventions: Self-care/ADL training;Therapeutic exercise;Neuromuscular education;DME and/or AE instruction;Therapeutic activities;Balance training;Patient/family education    OT Goals(Current goals can be found in the care plan section) Acute Rehab OT Goals Patient Stated Goal: none stated OT Goal Formulation: Patient unable to participate in goal setting Time For Goal Achievement: 03/11/22 Potential to Achieve Goals: Fair  OT Frequency: Min 2X/week    Co-evaluation              AM-PAC OT "6 Clicks" Daily Activity     Outcome Measure Help from another person eating meals?: A Little Help from another person taking care of personal grooming?: A Little Help from another person toileting, which includes using toliet, bedpan, or urinal?: A Lot Help from another person bathing (including washing, rinsing, drying)?: A Lot Help from another person to put on and taking off regular upper body clothing?: A Little Help from another person to put on and taking off regular lower body clothing?: A Lot 6 Click Score: 15   End of Session Equipment Utilized During Treatment: Gait belt;Rolling walker (2 wheels) Nurse Communication: Mobility status  Activity Tolerance: Patient tolerated treatment  well Patient left: in chair;with call bell/phone within reach;with chair alarm set;with nursing/sitter in room  OT Visit Diagnosis: Unsteadiness on feet (R26.81);Other abnormalities of gait and mobility (R26.89)                Time: 2694-8546 OT Time Calculation (min): 19 min Charges:  OT General Charges $OT Visit: 1 Visit OT Evaluation $OT Eval Moderate Complexity: 1 Mod  Sharyn Blitz OTR/L, MS Acute Rehabilitation Department Office# [phone] Pager#  [phone]   Ardyth Harps 02/25/2022, 1:01 PM

## 2022-02-25 NOTE — Progress Notes (Signed)
Progress Note    Alicia Schwartz   YKD:[id number]  DOB: Mar 12, 1931  DOA: 02/23/2022     2 PCP: Linus Galas, NP  Initial CC: Found down at home  Hospital Course: [name] is an 88 yo female with PMH HTN, SVT who presented after a fall at home and being too weak to get off the floor.  She was able to state that she accidentally fell off of her bed and after being on the floor for unknown amount of time she finally called her niece for help due to not being able to get up. She was then brought to the ER for further evaluation.  Work-up revealed AKI, mild rhabdomyolysis, and generalized weakness.  She was started on fluids and admitted for further work-up and PT evaluation.  Interval History:  No events overnight.  Sitting in recliner when seen this morning.  Hearing aids are functioning much better today and she had no concerns or complaints. Called and updated her son as well this afternoon.  He still wishes for her to go to rehab at discharge. Informed him she would be medically ready probably starting on Monday.  Assessment and Plan: * AKI (acute kidney injury) (HCC) - baseline creatinine ~ 0.9 - 1 - patient presents with increase in creat >0.3 mg/dL above baseline, creat increase >1.5x baseline presumed to have occurred within past 7 days PTA -Suspect due to prerenal/volume depletion from decreased intake and being on the floor.  CK is not high enough to cause pigment nephropathy at this time - Creatinine 1.76 on admission - creat improving with IVF - Continue fluids - Continue trending BMP  Rhabdomyolysis-resolved as of 02/25/2022 - Etiology presumed from being down on the floor after falling out of bed - Initial CK 669 and is downtrending with fluids  Hyponatremia-resolved as of 02/24/2022 - Mild and due to hypovolemia -Continue fluids  Physical deconditioning - Patient too weak to get up after falling out of bed - Family reports that she did fairly well with physical  therapy last time she went but upon returning home she did not stay as active and again became more deconditioned - Family is interested in pursuing rehab again at discharge  Hypokalemia - Replete as needed  Normocytic anemia - Baseline appears to be around 11 g/dL - Currently around baseline  HTN (hypertension) - Continue current regimen  Elevated troponin-resolved as of 02/24/2022 - Suspected demand and some possible contribution from AKI - Patient chest pain-free - No further work-up - Troponin has been trended and is downtrending as well   Old records reviewed in assessment of this patient  Antimicrobials:   DVT prophylaxis:  enoxaparin (LOVENOX) injection 30 mg Start: 02/24/22 1000 SCDs Start: 02/24/22 0009   Code Status:   Code Status: DNR  Mobility Assessment (last 72 hours)     Mobility Assessment     Row Name 02/25/22 1300 02/25/22 1102 02/24/22 2000 02/24/22 1507 02/24/22 0926   Does patient have an order for bedrest or is patient medically unstable -- No - Continue assessment No - Continue assessment -- --   What is the highest level of mobility based on the progressive mobility assessment? Level 4 (Walks with assist in room) - Balance while marching in place and cannot step forward and back - Complete Level 3 (Stands with assist) - Balance while standing  and cannot march in place Level 3 (Stands with assist) - Balance while standing  and cannot march in place Level 3 (Stands  with assist) - Balance while standing  and cannot march in place Level 3 (Stands with assist) - Balance while standing  and cannot march in place   Is the above level different from baseline mobility prior to current illness? -- Yes - Recommend PT order Yes - Recommend PT order -- --    Row Name 02/24/22 0015           Does patient have an order for bedrest or is patient medically unstable No - Continue assessment       What is the highest level of mobility based on the progressive  mobility assessment? Level 1 (Bedfast) - Unable to balance while sitting on edge of bed       Is the above level different from baseline mobility prior to current illness? Yes - Recommend PT order                Barriers to discharge: none Disposition Plan:  SNF.  Anticipate she will be medically cleared on Monday Status is: Inpt  Objective: Blood pressure (!) 150/49, pulse (!) 57, temperature 97.9 F (36.6 C), temperature source Oral, resp. rate 20, height 4\' 11"  (1.499 m), weight 46.9 kg, SpO2 100 %.  Examination:  Physical Exam Constitutional:      Comments: Frail elderly woman sitting up in chair in NAD  HENT:     Head: Normocephalic and atraumatic.     Mouth/Throat:     Mouth: Mucous membranes are moist.  Eyes:     Extraocular Movements: Extraocular movements intact.  Cardiovascular:     Rate and Rhythm: Normal rate and regular rhythm.  Pulmonary:     Effort: Pulmonary effort is normal.     Breath sounds: Normal breath sounds.  Abdominal:     General: Bowel sounds are normal. There is no distension.     Palpations: Abdomen is soft.     Tenderness: There is no abdominal tenderness.  Musculoskeletal:        General: Normal range of motion.     Cervical back: Normal range of motion and neck supple.  Skin:    General: Skin is warm and dry.  Neurological:     Mental Status: Mental status is at baseline.  Psychiatric:        Mood and Affect: Mood normal.        Behavior: Behavior normal.      Consultants:    Procedures:    Data Reviewed: Results for orders placed or performed during the hospital encounter of 02/23/22 (from the past 24 hour(s))  Basic metabolic panel     Status: Abnormal   Collection Time: 02/25/22  4:30 AM  Result Value Ref Range   Sodium 132 (L) 135 - 145 mmol/L   Potassium 3.6 3.5 - 5.1 mmol/L   Chloride 104 98 - 111 mmol/L   CO2 22 22 - 32 mmol/L   Glucose, Bld 95 70 - 99 mg/dL   BUN 28 (H) 8 - 23 mg/dL   Creatinine, Ser 04/27/22 (H)  0.44 - 1.00 mg/dL   Calcium 8.4 (L) 8.9 - 10.3 mg/dL   GFR, Estimated 47 (L) >60 mL/min   Anion gap 6 5 - 15  CBC with Differential/Platelet     Status: Abnormal   Collection Time: 02/25/22  4:30 AM  Result Value Ref Range   WBC 5.8 4.0 - 10.5 K/uL   RBC 2.68 (L) 3.87 - 5.11 MIL/uL   Hemoglobin 9.4 (L) 12.0 - 15.0 g/dL  HCT 27.4 (L) 36.0 - 46.0 %   MCV 102.2 (H) 80.0 - 100.0 fL   MCH 35.1 (H) 26.0 - 34.0 pg   MCHC 34.3 30.0 - 36.0 g/dL   RDW 56.8 12.7 - 51.7 %   Platelets 199 150 - 400 K/uL   nRBC 0.0 0.0 - 0.2 %   Neutrophils Relative % 66 %   Neutro Abs 3.8 1.7 - 7.7 K/uL   Lymphocytes Relative 18 %   Lymphs Abs 1.1 0.7 - 4.0 K/uL   Monocytes Relative 12 %   Monocytes Absolute 0.7 0.1 - 1.0 K/uL   Eosinophils Relative 4 %   Eosinophils Absolute 0.2 0.0 - 0.5 K/uL   Basophils Relative 0 %   Basophils Absolute 0.0 0.0 - 0.1 K/uL   Immature Granulocytes 0 %   Abs Immature Granulocytes 0.02 0.00 - 0.07 K/uL  Magnesium     Status: None   Collection Time: 02/25/22  4:30 AM  Result Value Ref Range   Magnesium 1.9 1.7 - 2.4 mg/dL    I have Reviewed nursing notes, Vitals, and Lab results since pt's last encounter. Pertinent lab results : see above I have ordered test including BMP, CBC, Mg I have reviewed the last note from staff over past 24 hours I have discussed pt's care plan and test results with nursing staff, case manager   LOS: 2 days   Lewie Chamber, MD [facility] 02/25/2022, 1:47 PM

## 2022-02-25 NOTE — TOC Initial Note (Signed)
Transition of Care [facility]) - Initial/Assessment Note    Patient Details  Name: Alicia Schwartz MRN: [id number] Date of Birth: 1930/09/18  [facility] CM/SW Contact:    Darleene Cleaver, LCSW Phone Number: 02/25/2022, 5:59 PM  Clinical Narrative:                  Patient is an 88 year old female who lives with her niece.  Patient's son [name] lives in [place], and requested that patient go to SNF before returning back home.  Patient is alert and oriented x4 and agreeable to SNF placement.  CSW spoke to patient's son to complete assessment due to patient being tired.  Per patient's son she was at [facility] in April.  He would prefer that she return there since she is familiar with the SNF and the people there.  CSW explained to patient's son the process for looking for SNF placement.  Patient's son gave CSW permission to begin bed search in [facility] and to keep him posted on available facilities.  CSW awaiting for bed offers.  Expected Discharge Plan: Skilled Nursing Facility Barriers to Discharge: Continued Medical Work up, English as a second language teacher   Patient Goals and CMS Choice Patient states their goals for this hospitalization and ongoing recovery are:: Per son, plan to go to SNF then return back home with him in [place] or back to her home. CMS Medicare.gov Compare Post Acute Care list provided to:: Patient Represenative (must comment) Choice offered to / list presented to : Adult Children  Expected Discharge Plan and Services Expected Discharge Plan: Skilled Nursing Facility       Living arrangements for the past 2 months: Single Family Home                                      Prior Living Arrangements/Services Living arrangements for the past 2 months: Single Family Home Lives with:: Relatives Patient language and need for interpreter reviewed:: Yes Do you feel safe going back to the place where you live?: No   Patient's son feels she  needs rehab first before returning back home.  Need for Family Participation in Patient Care: Yes (Comment) Care giver support system in place?: No (comment)   Criminal Activity/Legal Involvement Pertinent to Current Situation/Hospitalization: No - Comment as needed  Activities of Daily Living Home Assistive Devices/Equipment: Eyeglasses, Dan Humphreys (specify type) ADL Screening (condition at time of admission) Patient's cognitive ability adequate to safely complete daily activities?: Yes Is the patient deaf or have difficulty hearing?: Yes Does the patient have difficulty seeing, even when wearing glasses/contacts?: No Does the patient have difficulty concentrating, remembering, or making decisions?: No Patient able to express need for assistance with ADLs?: Yes Does the patient have difficulty dressing or bathing?: No Independently performs ADLs?: Yes (appropriate for developmental age) Does the patient have difficulty walking or climbing stairs?: No Weakness of Legs: Both Weakness of Arms/Hands: Both  Permission Sought/Granted Permission sought to share information with : Case Manager, Magazine features editor, Family Supports Permission granted to share information with : Yes, Release of Information Signed, Yes, Verbal Permission Granted  Share Information with NAME: Ratcliff,[name] Niece [phone]  [phone]  Mailey, [place]   [phone]  Permission granted to share info w AGENCY: SNF admissions        Emotional Assessment Appearance:: Appears stated age   Affect (typically observed): Accepting, Appropriate, Calm, Stable  Orientation: : Oriented to Self, Oriented to Place, Oriented to  Time, Oriented to Situation Alcohol / Substance Use: Not Applicable Psych Involvement: No (comment)  Admission diagnosis:  AKI (acute kidney injury) (HCC) [N17.9] Acute kidney injury (HCC) [N17.9] Patient Active Problem List   Diagnosis Date Noted   Normocytic anemia 02/24/2022    Hypokalemia    Physical deconditioning    AKI (acute kidney injury) (HCC) 02/23/2022   Lower extremity edema 01/26/2022   Closed T12 fracture (HCC) 07/04/2019   Hypertensive urgency 07/04/2019   Back pain 07/04/2019   Pressure injury of skin 07/04/2019   Postoperative anemia due to acute blood loss 02/14/2015   Paroxysmal supraventricular tachycardia (HCC) 02/12/2015   Fracture, subtrochanteric, right femur, closed (HCC) 02/11/2015   Fall due to stumbling 02/11/2015   Anxiety state 09/19/2006   HTN (hypertension) 09/19/2006   PCP:  Linus Galas, NP Pharmacy:   [facility] [id number] [place], [place] - [address] [place] [address] Phone: [phone] Fax: [phone]     Social Determinants of Health (SDOH) Interventions    Readmission Risk Interventions     No data to display

## 2022-02-25 NOTE — Progress Notes (Deleted)
Cardiology Office Note:    Date:  02/25/2022   ID:  Alicia Schwartz, DOB 01-09-1931, MRN [id number]  PCP:  Linus Galas, NP  Cardiologist:  None  Electrophysiologist:  None   Referring MD: Linus Galas, NP   Chief Complaint: follow-up of lower extremity edema  History of Present Illness:    Alicia Schwartz is a 88 y.o. female with a history of hypertension and lower extremity edema who is followed by Dr. Allyson Sabal and presents today for follow-up of lower extremity edema.  Patient was recently referred to Dr. Allyson Sabal on 01/26/2022 for further evaluation of lower extremity edema. Her PCP started her on a low dose diuretic and ordered an Echo prior to visit. Echo showed normal LV function without any significant valvular disease. Diastolic function was not commented on. She denied any chest pain or shortness of breath and only cardiac risk factor was hypertension. PCP had also placed a palliative care consult. She was noted to have 2-3+ pitting edema on exam. Lasix was increased from 10mg  to 20mg  daily and she was continued on HCTZ.  Since last visit, she was recently admitted from 02/23/2022 to *** after presenting with a mechanical fall. Work-up revealed AKI, mild rhabdomyolysis, and generalized weakness. She was also noted to be markedly hypertensive on arrival. High-sensitivity troponin was mildly elevated and flat peaking at 62 consistent with demand ischemia. She was treated with IV fluids with improvement in renal function and CK. ***  Patient presents today for follow-up. ***  Lower Extremity Edema Patient recently referred to Dr. for further evaluation of lower extremity edema. Echo at PCP's office in 12/2021 showed normal LV function without any significant valvular disease. Diastolic function was not commented on. Lasix was increased at last visit last month.  - *** - Continue Lasix 20mg  daily. Also on Losartan-HCTZ combo medication. - Recommended compression stockings, elevating  legs, and low sodium diet.  Hypertension BP well controlled. *** - Continue current medications: Losartan-HCTZ 50-12.5mg  daily and  Toprol-XL ***.  Past Medical History:  Diagnosis Date   Fracture, subtrochanteric, right femur, closed (HCC) 02/11/2015   Hypertension    Paroxysmal supraventricular tachycardia (HCC) 02/12/2015    Past Surgical History:  Procedure Laterality Date   ABDOMINAL HYSTERECTOMY     INTRAMEDULLARY (IM) NAIL INTERTROCHANTERIC Right 02/12/2015   Procedure: INTRAMEDULLARY (IM) NAIL INTERTROCHANTRIC;  Surgeon: 02/13/2015, MD;  Location: [facility] OR;  Service: Orthopedics;  Laterality: Right;   IR VERTEBROPLASTY CERV/THOR BX INC UNI/BIL INC/INJECT/IMAGING  07/08/2019    Current Medications: No outpatient medications have been marked as taking for the 03/01/22 encounter (Appointment) with Teryl Lucy, PA-C.     Allergies:   Patient has no known allergies.   Social History   Socioeconomic History   Marital status: Widowed    Spouse name: Not on file   Number of children: Not on file   Years of education: Not on file   Highest education level: Not on file  Occupational History   Not on file  Tobacco Use   Smoking status: Never   Smokeless tobacco: Never  Substance and Sexual Activity   Alcohol use: No   Drug use: Not on file   Sexual activity: Not on file  Other Topics Concern   Not on file  Social History Narrative   Not on file   Social Determinants of Health   Financial Resource Strain: Not on file  Food Insecurity: Not on file  Transportation Needs: Not on file  Physical Activity: Not on file  Stress: Not on file  Social Connections: Not on file     Family History: The patient's ***family history includes Alzheimer's disease in her mother; Prostate cancer in her father.  ROS:   Please see the history of present illness.    *** All other systems reviewed and are negative.  EKGs/Labs/Other Studies Reviewed:    The following studies  were reviewed:  See HPI above.  EKG:  EKG not ordered today.   Recent Labs: 02/24/2022: ALT 24; TSH 6.320 02/25/2022: BUN 28; Creatinine, Ser 1.13; Hemoglobin 9.4; Magnesium 1.9; Platelets 199; Potassium 3.6; Sodium 132  Recent Lipid Panel    Component Value Date/Time   CHOL 147 02/24/2022 0042   TRIG 63 02/24/2022 0042   HDL 48 02/24/2022 0042   CHOLHDL 3.1 02/24/2022 0042   VLDL 13 02/24/2022 0042   LDLCALC 86 02/24/2022 0042    Physical Exam:    Vital Signs: There were no vitals taken for this visit.    Wt Readings from Last 3 Encounters:  02/24/22 103 lb 6.4 oz (46.9 kg)  01/26/22 105 lb (47.6 kg)  11/03/21 97 lb (44 kg)     General: 88 y.o. female in no acute distress. HEENT: Normocephalic and atraumatic. Sclera clear. EOMs intact. Neck: Supple. No carotid bruits. No JVD. Heart: *** RRR. Distinct S1 and S2. No murmurs, gallops, or rubs. Radial and distal pedal pulses 2+ and equal bilaterally. Lungs: No increased work of breathing. Clear to ausculation bilaterally. No wheezes, rhonchi, or rales.  Abdomen: Soft, non-distended, and non-tender to palpation. Bowel sounds present in all 4 quadrants.  MSK: Normal strength and tone for age. *** Extremities: No lower extremity edema.    Skin: Warm and dry. Neuro: Alert and oriented x3. No focal deficits. Psych: Normal affect. Responds appropriately.   Assessment:    No diagnosis found.  Plan:     Disposition: Follow up in ***   Medication Adjustments/Labs and Tests Ordered: Current medicines are reviewed at length with the patient today.  Concerns regarding medicines are outlined above.  No orders of the defined types were placed in this encounter.  No orders of the defined types were placed in this encounter.   There are no Patient Instructions on file for this visit.   Signed, Corrin Parker, PA-C  02/25/2022 5:45 PM    [facility]

## 2022-02-25 NOTE — Plan of Care (Signed)

## 2022-02-25 NOTE — NC FL2 (Signed)
[place] MEDICAID FL2 LEVEL OF CARE SCREENING TOOL     IDENTIFICATION  Patient Name: Alicia Schwartz Birthdate: 02-Jul-1931 Sex: female Admission Date (Current Location): 02/23/2022  [place] and [place] Number:  Haynes Bast [id number] O Facility and Address:  [facility],  [address], [place] [address]      Provider Number: [id number]  Attending Physician Name and Address:  Lewie Chamber, MD  Relative Name and Phone Number:  Ratcliff,[name] Niece [phone]  [phone]  Maurine, Mowbray   [phone]    Current Level of Care: Hospital Recommended Level of Care: Skilled Nursing Facility Prior Approval Number:    Date Approved/Denied:   PASRR Number: [id number]  Discharge Plan: SNF    Current Diagnoses: Patient Active Problem List   Diagnosis Date Noted   Normocytic anemia 02/24/2022   Hypokalemia    Physical deconditioning    AKI (acute kidney injury) (HCC) 02/23/2022   Lower extremity edema 01/26/2022   Closed T12 fracture (HCC) 07/04/2019   Hypertensive urgency 07/04/2019   Back pain 07/04/2019   Pressure injury of skin 07/04/2019   Postoperative anemia due to acute blood loss 02/14/2015   Paroxysmal supraventricular tachycardia (HCC) 02/12/2015   Fracture, subtrochanteric, right femur, closed (HCC) 02/11/2015   Fall due to stumbling 02/11/2015   Anxiety state 09/19/2006   HTN (hypertension) 09/19/2006    Orientation RESPIRATION BLADDER Height & Weight     Self, Time, Situation, Place  Normal Incontinent Weight: 103 lb 6.4 oz (46.9 kg) Height:  4\' 11"  (149.9 cm)  BEHAVIORAL SYMPTOMS/MOOD NEUROLOGICAL BOWEL NUTRITION STATUS      Continent Diet (Cardiac)  AMBULATORY STATUS COMMUNICATION OF NEEDS Skin   Limited Assist Verbally Normal                       Personal Care Assistance Level of Assistance  Bathing, Feeding, Dressing Bathing Assistance: Limited assistance Feeding assistance: Independent Dressing Assistance: Limited  assistance     Functional Limitations Info  Sight, Hearing, Speech Sight Info: Adequate Hearing Info: Adequate Speech Info: Adequate    SPECIAL CARE FACTORS FREQUENCY  PT (By licensed PT), OT (By licensed OT)     PT Frequency: Minimum 5x a week OT Frequency: Minimum 5x a week            Contractures Contractures Info: Not present    Additional Factors Info  Code Status, Allergies, Psychotropic Code Status Info: DNR Allergies Info: NKA Psychotropic Info: sertraline (ZOLOFT) tablet 25 mg         Current Medications (02/25/2022):  This is the current hospital active medication list Current Facility-Administered Medications  Medication Dose Route Frequency Provider Last Rate Last Admin   0.9 %  sodium chloride infusion   Intravenous Continuous Hugelmeyer, Alexis, DO 75 mL/hr at 02/25/22 1650 New Bag at 02/25/22 1650   acetaminophen (TYLENOL) tablet 650 mg  650 mg Oral Q6H PRN Hugelmeyer, Alexis, DO   650 mg at 02/25/22 1014   Or   acetaminophen (TYLENOL) suppository 650 mg  650 mg Rectal Q6H PRN Hugelmeyer, Alexis, DO       aspirin EC tablet 81 mg  81 mg Oral Daily Hugelmeyer, Alexis, DO   81 mg at 02/25/22 1015   bisacodyl (DULCOLAX) EC tablet 5 mg  5 mg Oral Daily PRN Hugelmeyer, Alexis, DO       enoxaparin (LOVENOX) injection 30 mg  30 mg Subcutaneous Q24H Hugelmeyer, Alexis, DO   30 mg at 02/25/22 1015   furosemide (LASIX) tablet  10 mg  10 mg Oral Daily Hugelmeyer, Alexis, DO   10 mg at 02/25/22 1015   hydrALAZINE (APRESOLINE) injection 5 mg  5 mg Intravenous Q6H PRN Hugelmeyer, Alexis, DO   5 mg at 02/24/22 2156   HYDROcodone-acetaminophen (NORCO/VICODIN) 5-325 MG per tablet 1 tablet  1 tablet Oral Q6H PRN Hugelmeyer, Alexis, DO       metoprolol succinate (TOPROL-XL) 24 hr tablet 25 mg  25 mg Oral Daily Hugelmeyer, Alexis, DO   25 mg at 02/25/22 1015   morphine (PF) 2 MG/ML injection 1 mg  1 mg Intravenous Q6H PRN Hugelmeyer, Alexis, DO       multivitamin with minerals  tablet 1 tablet  1 tablet Oral Daily Hugelmeyer, Alexis, DO   1 tablet at 02/25/22 1015   ondansetron (ZOFRAN) tablet 4 mg  4 mg Oral Q6H PRN Hugelmeyer, Alexis, DO       Or   ondansetron (ZOFRAN) injection 4 mg  4 mg Intravenous Q6H PRN Hugelmeyer, Alexis, DO       pneumococcal 20-valent conjugate vaccine (PREVNAR 20) injection 0.5 mL  0.5 mL Intramuscular Tomorrow-1000 Lewie Chamber, MD       senna-docusate (Senokot-S) tablet 1 tablet  1 tablet Oral QHS PRN Hugelmeyer, Alexis, DO   1 tablet at 02/24/22 2156   sertraline (ZOLOFT) tablet 25 mg  25 mg Oral Daily Hugelmeyer, Alexis, DO   25 mg at 02/25/22 1014   sodium chloride flush (NS) 0.9 % injection 3 mL  3 mL Intravenous Q12H Hugelmeyer, Alexis, DO   3 mL at 02/24/22 2159   traZODone (DESYREL) tablet 25 mg  25 mg Oral QHS PRN Hugelmeyer, Alexis, DO   25 mg at 02/24/22 2156     Discharge Medications: Please see discharge summary for a list of discharge medications.  Relevant Imaging Results:  Relevant Lab Results:   Additional Information SSN [id number]  Darleene Cleaver, LCSW

## 2022-02-26 DIAGNOSIS — N179 Acute kidney failure, unspecified: Secondary | ICD-10-CM | POA: Diagnosis not present

## 2022-02-26 DIAGNOSIS — R7989 Other specified abnormal findings of blood chemistry: Secondary | ICD-10-CM

## 2022-02-26 LAB — BASIC METABOLIC PANEL
Anion gap: 3 — ABNORMAL LOW (ref 5–15)
BUN: 25 mg/dL — ABNORMAL HIGH (ref 8–23)
CO2: 23 mmol/L (ref 22–32)
Calcium: 8.3 mg/dL — ABNORMAL LOW (ref 8.9–10.3)
Chloride: 105 mmol/L (ref 98–111)
Creatinine, Ser: 1.25 mg/dL — ABNORMAL HIGH (ref 0.44–1.00)
GFR, Estimated: 41 mL/min — ABNORMAL LOW (ref >60)
Glucose, Bld: 93 mg/dL (ref 70–99)
Potassium: 4.4 mmol/L (ref 3.5–5.1)
Sodium: 131 mmol/L — ABNORMAL LOW (ref 135–145)

## 2022-02-26 LAB — CBC WITH DIFFERENTIAL/PLATELET
Abs Immature Granulocytes: 0.03 10*3/uL (ref 0.00–0.07)
Basophils Absolute: 0 10*3/uL (ref 0.0–0.1)
Basophils Relative: 0 %
Eosinophils Absolute: 0.3 10*3/uL (ref 0.0–0.5)
Eosinophils Relative: 5 %
HCT: 26 % — ABNORMAL LOW (ref 36.0–46.0)
Hemoglobin: 9 g/dL — ABNORMAL LOW (ref 12.0–15.0)
Immature Granulocytes: 1 %
Lymphocytes Relative: 21 %
Lymphs Abs: 1.2 10*3/uL (ref 0.7–4.0)
MCH: 35.3 pg — ABNORMAL HIGH (ref 26.0–34.0)
MCHC: 34.6 g/dL (ref 30.0–36.0)
MCV: 102 fL — ABNORMAL HIGH (ref 80.0–100.0)
Monocytes Absolute: 0.7 10*3/uL (ref 0.1–1.0)
Monocytes Relative: 11 %
Neutro Abs: 3.8 10*3/uL (ref 1.7–7.7)
Neutrophils Relative %: 62 %
Platelets: 178 10*3/uL (ref 150–400)
RBC: 2.55 MIL/uL — ABNORMAL LOW (ref 3.87–5.11)
RDW: 12.2 % (ref 11.5–15.5)
WBC: 6 10*3/uL (ref 4.0–10.5)
nRBC: 0 % (ref 0.0–0.2)

## 2022-02-26 LAB — MAGNESIUM: Magnesium: 1.6 mg/dL — ABNORMAL LOW (ref 1.7–2.4)

## 2022-02-26 MED ORDER — MAGNESIUM SULFATE 2 GM/50ML IV SOLN
2.0000 g | Freq: Once | INTRAVENOUS | Status: AC
  Administered 2022-02-26: 2 g via INTRAVENOUS
  Filled 2022-02-26: qty 50

## 2022-02-26 NOTE — TOC Progression Note (Addendum)
Transition of Care [facility]) - Progression Note    Patient Details  Name: Alicia Schwartz MRN: [id number] Date of Birth: 06-01-1931  Transition of [facility]) CM/SW Contact  Lavenia Atlas, RN Phone Number: 02/26/2022, 3:40 PM  Clinical Narrative:  This RNCM spoke with [facility] care who will be able to accept patient tomorrow, working on Firefighter.  This RNCM spoke with patient's son [name] who advised [facility] is the family preference. This RNCM advised we are awaiting insurance auth for SNF placement.     Expected Discharge Plan: Skilled Nursing Facility Barriers to Discharge: Continued Medical Work up, English as a second language teacher  Expected Discharge Plan and Services Expected Discharge Plan: Skilled Nursing Facility       Living arrangements for the past 2 months: Single Family Home                        Social Determinants of Health (SDOH) Interventions    Readmission Risk Interventions     No data to display

## 2022-02-26 NOTE — Progress Notes (Signed)
Progress Note    Gidget Quizhpi   QIO:[id number]  DOB: Jan 12, 1931  DOA: 02/23/2022     3 PCP: Linus Galas, NP  Initial CC: Found down at home  Hospital Course: Ms. Huante is an 88 yo female with PMH HTN, SVT who presented after a fall at home and being too weak to get off the floor.  She was able to state that she accidentally fell off of her bed and after being on the floor for unknown amount of time she finally called her niece for help due to not being able to get up. She was then brought to the ER for further evaluation.  Work-up revealed AKI, mild rhabdomyolysis, and generalized weakness.  She was started on fluids and admitted for further work-up and PT evaluation.  Interval History:  No events overnight.  Resting in bed comfortably when seen.  She had no concerns this morning.  Assessment and Plan: * AKI (acute kidney injury) (HCC) - baseline creatinine ~ 0.9 - 1 - patient presents with increase in creat >0.3 mg/dL above baseline, creat increase >1.5x baseline presumed to have occurred within past 7 days PTA -Suspect due to prerenal/volume depletion from decreased intake and being on the floor.  CK is not high enough to cause pigment nephropathy at this time - Creatinine 1.76 on admission - creat improving with IVF - Continue fluids - Continue trending BMP  Rhabdomyolysis-resolved as of 02/25/2022 - Etiology presumed from being down on the floor after falling out of bed - Initial CK 669 and is downtrending with fluids  Hyponatremia-resolved as of 02/24/2022 - Mild and due to hypovolemia -Continue fluids  Physical deconditioning - Patient too weak to get up after falling out of bed - Family reports that she did fairly well with physical therapy last time she went but upon returning home she did not stay as active and again became more deconditioned - Family is interested in pursuing rehab again at discharge  Elevated TSH - possibly euythroid sick  - TSH elevated  6.32, Free T4 normal 1.11, Total T3 low 66 - no history of hypothyroidism noted; no thyroid meds noted - repeat thyroid studies in 4-6 weeks and re-evaluate at that time   Hypokalemia - Replete as needed  Normocytic anemia - Baseline appears to be around 11 g/dL - Currently around baseline  HTN (hypertension) - Continue current regimen  Elevated troponin-resolved as of 02/24/2022 - Suspected demand and some possible contribution from AKI - Patient chest pain-free - No further work-up - Troponin has been trended and is downtrending as well   Old records reviewed in assessment of this patient  Antimicrobials:   DVT prophylaxis:  enoxaparin (LOVENOX) injection 30 mg Start: 02/24/22 1000 SCDs Start: 02/24/22 0009   Code Status:   Code Status: DNR  Mobility Assessment (last 72 hours)     Mobility Assessment     Row Name 02/26/22 1040 02/25/22 2200 02/25/22 1300 02/25/22 1102 02/24/22 2000   Does patient have an order for bedrest or is patient medically unstable No - Continue assessment No - Continue assessment -- No - Continue assessment No - Continue assessment   What is the highest level of mobility based on the progressive mobility assessment? Level 4 (Walks with assist in room) - Balance while marching in place and cannot step forward and back - Complete Level 4 (Walks with assist in room) - Balance while marching in place and cannot step forward and back - Complete Level 4 (Walks with  assist in room) - Balance while marching in place and cannot step forward and back - Complete Level 3 (Stands with assist) - Balance while standing  and cannot march in place Level 3 (Stands with assist) - Balance while standing  and cannot march in place   Is the above level different from baseline mobility prior to current illness? -- Yes - Recommend PT order -- Yes - Recommend PT order Yes - Recommend PT order    Row Name 02/24/22 1507 02/24/22 0926 02/24/22 0015       Does patient have an  order for bedrest or is patient medically unstable -- -- No - Continue assessment     What is the highest level of mobility based on the progressive mobility assessment? Level 3 (Stands with assist) - Balance while standing  and cannot march in place Level 3 (Stands with assist) - Balance while standing  and cannot march in place Level 1 (Bedfast) - Unable to balance while sitting on edge of bed     Is the above level different from baseline mobility prior to current illness? -- -- Yes - Recommend PT order              Barriers to discharge: none Disposition Plan:  SNF.  Anticipate she will be medically cleared on Monday Status is: Inpt  Objective: Blood pressure (!) 148/49, pulse (!) 56, temperature 97.7 F (36.5 C), temperature source Oral, resp. rate 14, height 4\' 11"  (1.499 m), weight 46.9 kg, SpO2 98 %.  Examination:  Physical Exam Constitutional:      General: She is not in acute distress.    Appearance: Normal appearance. She is not ill-appearing.  HENT:     Head: Normocephalic and atraumatic.     Mouth/Throat:     Mouth: Mucous membranes are moist.  Eyes:     Extraocular Movements: Extraocular movements intact.  Cardiovascular:     Rate and Rhythm: Normal rate and regular rhythm.  Pulmonary:     Effort: Pulmonary effort is normal.     Breath sounds: Normal breath sounds.  Abdominal:     General: Bowel sounds are normal. There is no distension.     Palpations: Abdomen is soft.     Tenderness: There is no abdominal tenderness.  Musculoskeletal:        General: Normal range of motion.     Cervical back: Normal range of motion and neck supple.  Skin:    General: Skin is warm and dry.  Neurological:     Mental Status: She is alert. Mental status is at baseline.  Psychiatric:        Mood and Affect: Mood normal.        Behavior: Behavior normal.      Consultants:    Procedures:    Data Reviewed: Results for orders placed or performed during the hospital  encounter of 02/23/22 (from the past 24 hour(s))  Basic metabolic panel     Status: Abnormal   Collection Time: 02/26/22  3:24 AM  Result Value Ref Range   Sodium 131 (L) 135 - 145 mmol/L   Potassium 4.4 3.5 - 5.1 mmol/L   Chloride 105 98 - 111 mmol/L   CO2 23 22 - 32 mmol/L   Glucose, Bld 93 70 - 99 mg/dL   BUN 25 (H) 8 - 23 mg/dL   Creatinine, Ser 04/28/22 (H) 0.44 - 1.00 mg/dL   Calcium 8.3 (L) 8.9 - 10.3 mg/dL   GFR, Estimated 41 (  L) >60 mL/min   Anion gap 3 (L) 5 - 15  CBC with Differential/Platelet     Status: Abnormal   Collection Time: 02/26/22  3:24 AM  Result Value Ref Range   WBC 6.0 4.0 - 10.5 K/uL   RBC 2.55 (L) 3.87 - 5.11 MIL/uL   Hemoglobin 9.0 (L) 12.0 - 15.0 g/dL   HCT 28.4 (L) 13.2 - 44.0 %   MCV 102.0 (H) 80.0 - 100.0 fL   MCH 35.3 (H) 26.0 - 34.0 pg   MCHC 34.6 30.0 - 36.0 g/dL   RDW 10.2 72.5 - 36.6 %   Platelets 178 150 - 400 K/uL   nRBC 0.0 0.0 - 0.2 %   Neutrophils Relative % 62 %   Neutro Abs 3.8 1.7 - 7.7 K/uL   Lymphocytes Relative 21 %   Lymphs Abs 1.2 0.7 - 4.0 K/uL   Monocytes Relative 11 %   Monocytes Absolute 0.7 0.1 - 1.0 K/uL   Eosinophils Relative 5 %   Eosinophils Absolute 0.3 0.0 - 0.5 K/uL   Basophils Relative 0 %   Basophils Absolute 0.0 0.0 - 0.1 K/uL   Immature Granulocytes 1 %   Abs Immature Granulocytes 0.03 0.00 - 0.07 K/uL  Magnesium     Status: Abnormal   Collection Time: 02/26/22  3:24 AM  Result Value Ref Range   Magnesium 1.6 (L) 1.7 - 2.4 mg/dL    I have Reviewed nursing notes, Vitals, and Lab results since pt's last encounter. Pertinent lab results : see above I have ordered test including BMP, CBC, Mg I have reviewed the last note from staff over past 24 hours I have discussed pt's care plan and test results with nursing staff, case manager   LOS: 3 days   Lewie Chamber, MD [facility] 02/26/2022, 12:59 PM

## 2022-02-26 NOTE — Plan of Care (Signed)

## 2022-02-26 NOTE — Assessment & Plan Note (Signed)
-   possibly euythroid sick  - TSH elevated 6.32, Free T4 normal 1.11, Total T3 low 66 - no history of hypothyroidism noted; no thyroid meds noted - repeat thyroid studies in 4-6 weeks and re-evaluate at that time

## 2022-02-26 NOTE — Progress Notes (Signed)
Physical Therapy Treatment Patient Details Name: Alicia Schwartz MRN: [id number] DOB: [birth year] Today's Date: 02/26/2022   History of Present Illness Patient is a 88 year old  female who presented to the hospital after a fall. patient was admitted with generalized weakness,and AKI. PMH: HTN, SVT, R femur fx with history of IMN 2016, vertebroplasty 2020.    PT Comments    Pt agreeable to working with therapy. Session focused on working on sit to stand transitions for strengthening, activity tolerance. She was able to stand x 3 for ~10 seconds each time before having to sit due to weakness and fatigue. Afterwards, pt was able to perform LAQs while seated in recliner. Pt is fearful of falling. Will continue to progress activity as tolerated. Continue to recommend ST SNF rehab.    Recommendations for follow up therapy are one component of a multi-disciplinary discharge planning process, led by the attending physician.  Recommendations may be updated based on patient status, additional functional criteria and insurance authorization.  Follow Up Recommendations  Skilled nursing-short term rehab (<3 hours/day) Can patient physically be transported by private vehicle: No   Assistance Recommended at Discharge Frequent or constant Supervision/Assistance  Patient can return home with the following A lot of help with walking and/or transfers;A lot of help with bathing/dressing/bathroom;Assistance with cooking/housework;Direct supervision/assist for medications management;Direct supervision/assist for financial management;Assist for transportation;Help with stairs or ramp for entrance   Equipment Recommendations  None recommended by PT    Recommendations for Other Services       Precautions / Restrictions Precautions Precautions: Fall Restrictions Weight Bearing Restrictions: No     Mobility  Bed Mobility               General bed mobility comments: oob in recliner     Transfers Overall transfer level: Needs assistance Equipment used: Rolling walker (2 wheels) Transfers: Sit to/from Stand Sit to Stand: Mod assist           General transfer comment: Sit to stand x 3- to work on strengthening, activity tolerance, technique, tolerance Pt was able to stand ~10 seconds each time. Cues for safety, technique, hand/feet placement. Assist to power up, steady, control descent.    Ambulation/Gait                   Stairs             Wheelchair Mobility    Modified Rankin (Stroke Patients Only)       Balance Overall balance assessment: Needs assistance, History of Falls         Standing balance support: Bilateral upper extremity supported, During functional activity Standing balance-Leahy Scale: Poor                              Cognition Arousal/Alertness: Awake/alert Behavior During Therapy: WFL for tasks assessed/performed Overall Cognitive Status: No family/caregiver present to determine baseline cognitive functioning Area of Impairment: Problem solving                             Problem Solving: Difficulty sequencing, Requires verbal cues          Exercises Total Joint Exercises Long Arc Quad: AROM, Both, 10 reps, Seated    General Comments        Pertinent Vitals/Pain Pain Assessment Pain Assessment: Faces Faces Pain Scale: Hurts little more Pain Location: LEs/buttocks Pain Descriptors /  Indicators: Sore, Discomfort Pain Intervention(s): Limited activity within patient's tolerance, Monitored during session, Repositioned    Home Living                          Prior Function            PT Goals (current goals can now be found in the care plan section) Progress towards PT goals: Progressing toward goals    Frequency    Min 2X/week      PT Plan Current plan remains appropriate    Co-evaluation              AM-PAC PT "6 Clicks" Mobility   Outcome  Measure  Help needed turning from your back to your side while in a flat bed without using bedrails?: A Lot Help needed moving from lying on your back to sitting on the side of a flat bed without using bedrails?: A Lot Help needed moving to and from a bed to a chair (including a wheelchair)?: A Lot Help needed standing up from a chair using your arms (e.g., wheelchair or bedside chair)?: A Lot Help needed to walk in hospital room?: A Lot Help needed climbing 3-5 steps with a railing? : Total 6 Click Score: 11    End of Session Equipment Utilized During Treatment: Gait belt Activity Tolerance: Patient tolerated treatment well;Patient limited by fatigue Patient left: in chair;with call bell/phone within reach;with chair alarm set   PT Visit Diagnosis: Other abnormalities of gait and mobility (R26.89);Muscle weakness (generalized) (M62.81)     Time: 9432-7614 PT Time Calculation (min) (ACUTE ONLY): 15 min  Charges:  $Therapeutic Activity: 8-22 mins                         Faye Ramsay, PT Acute Rehabilitation  Office: [phone] Pager: [phone]

## 2022-02-27 DIAGNOSIS — R410 Disorientation, unspecified: Secondary | ICD-10-CM | POA: Diagnosis not present

## 2022-02-27 DIAGNOSIS — F419 Anxiety disorder, unspecified: Secondary | ICD-10-CM | POA: Diagnosis not present

## 2022-02-27 DIAGNOSIS — M5030 Other cervical disc degeneration, unspecified cervical region: Secondary | ICD-10-CM | POA: Diagnosis not present

## 2022-02-27 DIAGNOSIS — E46 Unspecified protein-calorie malnutrition: Secondary | ICD-10-CM | POA: Diagnosis not present

## 2022-02-27 DIAGNOSIS — N1832 Chronic kidney disease, stage 3b: Secondary | ICD-10-CM | POA: Diagnosis not present

## 2022-02-27 DIAGNOSIS — N179 Acute kidney failure, unspecified: Secondary | ICD-10-CM | POA: Diagnosis not present

## 2022-02-27 DIAGNOSIS — E7800 Pure hypercholesterolemia, unspecified: Secondary | ICD-10-CM | POA: Diagnosis not present

## 2022-02-27 DIAGNOSIS — E860 Dehydration: Secondary | ICD-10-CM | POA: Diagnosis not present

## 2022-02-27 DIAGNOSIS — Z7401 Bed confinement status: Secondary | ICD-10-CM | POA: Diagnosis not present

## 2022-02-27 DIAGNOSIS — Z9181 History of falling: Secondary | ICD-10-CM | POA: Diagnosis not present

## 2022-02-27 DIAGNOSIS — R2689 Other abnormalities of gait and mobility: Secondary | ICD-10-CM | POA: Diagnosis not present

## 2022-02-27 DIAGNOSIS — I10 Essential (primary) hypertension: Secondary | ICD-10-CM | POA: Diagnosis not present

## 2022-02-27 DIAGNOSIS — M6281 Muscle weakness (generalized): Secondary | ICD-10-CM | POA: Diagnosis not present

## 2022-02-27 DIAGNOSIS — R600 Localized edema: Secondary | ICD-10-CM | POA: Diagnosis not present

## 2022-02-27 DIAGNOSIS — D638 Anemia in other chronic diseases classified elsewhere: Secondary | ICD-10-CM | POA: Diagnosis not present

## 2022-02-27 DIAGNOSIS — J069 Acute upper respiratory infection, unspecified: Secondary | ICD-10-CM | POA: Diagnosis not present

## 2022-02-27 DIAGNOSIS — R7989 Other specified abnormal findings of blood chemistry: Secondary | ICD-10-CM | POA: Diagnosis not present

## 2022-02-27 DIAGNOSIS — I471 Supraventricular tachycardia: Secondary | ICD-10-CM | POA: Diagnosis not present

## 2022-02-27 DIAGNOSIS — R262 Difficulty in walking, not elsewhere classified: Secondary | ICD-10-CM | POA: Diagnosis not present

## 2022-02-27 DIAGNOSIS — R5381 Other malaise: Secondary | ICD-10-CM | POA: Diagnosis not present

## 2022-02-27 DIAGNOSIS — R296 Repeated falls: Secondary | ICD-10-CM | POA: Diagnosis not present

## 2022-02-27 DIAGNOSIS — S22089D Unspecified fracture of T11-T12 vertebra, subsequent encounter for fracture with routine healing: Secondary | ICD-10-CM | POA: Diagnosis not present

## 2022-02-27 DIAGNOSIS — T796XXD Traumatic ischemia of muscle, subsequent encounter: Secondary | ICD-10-CM | POA: Diagnosis not present

## 2022-02-27 DIAGNOSIS — E871 Hypo-osmolality and hyponatremia: Secondary | ICD-10-CM | POA: Diagnosis not present

## 2022-02-27 MED ORDER — METOPROLOL SUCCINATE ER 25 MG PO TB24
25.0000 mg | ORAL_TABLET | Freq: Every day | ORAL | Status: AC
Start: 2022-02-27 — End: ?

## 2022-02-27 MED ORDER — LOSARTAN POTASSIUM 50 MG PO TABS
75.0000 mg | ORAL_TABLET | Freq: Every day | ORAL | Status: DC
  Administered 2022-02-27: 75 mg via ORAL
  Filled 2022-02-27: qty 1

## 2022-02-27 MED ORDER — FERROUS SULFATE 325 (65 FE) MG PO TABS: 325.0000 mg | ORAL_TABLET | Freq: Every day | ORAL | Status: DC

## 2022-02-27 MED ORDER — LOSARTAN POTASSIUM 25 MG PO TABS: 75.0000 mg | ORAL_TABLET | Freq: Every day | ORAL | Status: AC

## 2022-02-27 NOTE — TOC Transition Note (Addendum)
Transition of Care [facility]) - CM/SW Discharge Note   Patient Details  Name: Alicia Schwartz MRN: [id number] Date of Birth: 1931/03/24  [facility] CM/SW Contact:  Lavenia Atlas, RN Phone Number: 02/27/2022, 12:18 PM   Clinical Narrative: Insurance auth. received, patient accepted at [facility], Sharin Mons has been called, med Arther Dames form and face sheet sent to nursing station.family has been notified. Report can be called to [phone], room#127B. RN, MD notified.       TOC will continue to follow.   Final next level of care: Skilled Nursing Facility Barriers to Discharge: No Barriers Identified   Patient Goals and CMS Choice Patient states their goals for this hospitalization and ongoing recovery are::  (go to SNF) CMS Medicare.gov Compare Post Acute Care list provided to:: Patient Represenative (must comment) Choice offered to / list presented to : Adult Children  Discharge Placement  SNF- [facility]                     Discharge Plan and Services   Discharge Planning Services: CM Consult Post Acute Care Choice: Skilled Nursing Facility                               Social Determinants of Health (SDOH) Interventions     Readmission Risk Interventions     No data to display

## 2022-02-27 NOTE — Progress Notes (Signed)
Called report to [employer], Spoke with [place].  Val [place]

## 2022-02-27 NOTE — Plan of Care (Signed)
  Problem: Education: Goal: Knowledge of General Education information will improve Description Including pain rating scale, medication(s)/side effects and non-pharmacologic comfort measures Outcome: Progressing   Problem: Health Behavior/Discharge Planning: Goal: Ability to manage health-related needs will improve Outcome: Progressing   

## 2022-02-27 NOTE — Discharge Summary (Signed)
Physician Discharge Summary   Karlisa Gaubert YSA:[id number] DOB: September 13, 1930 DOA: 02/23/2022  PCP: Linus Galas, NP  Admit date: 02/23/2022 Discharge date:  02/27/2022 Barriers to discharge: none  Admitted From: Home Disposition:  [facility] Discharging physician: Lewie Chamber, MD  Recommendations for Outpatient Follow-up:  May need further BP regimen adjustment Repeat thyroid studies in 4-6 weeks  Home Health:  Equipment/Devices:   Discharge Condition: stable CODE STATUS: DNR Diet recommendation:  Diet Orders (From admission, onward)     Start     Ordered   02/27/22 0000  Diet - low sodium heart healthy        02/27/22 0920   02/24/22 0009  Diet Heart Room service appropriate? Yes; Fluid consistency: Thin  Diet effective now       Question Answer Comment  Room service appropriate? Yes   Fluid consistency: Thin      02/24/22 0008            Hospital Course: Ms. Caporaso is an 88 yo female with PMH HTN, SVT who presented after a fall at home and being too weak to get off the floor.  She was able to state that she accidentally fell off of her bed and after being on the floor for unknown amount of time she finally called her niece for help due to not being able to get up. She was then brought to the ER for further evaluation.  Work-up revealed AKI, mild rhabdomyolysis, and generalized weakness.  She was started on fluids and admitted for further work-up and PT evaluation.  Assessment and Plan: * AKI (acute kidney injury) (HCC) - baseline creatinine ~ 0.9 - 1 - patient presents with increase in creat >0.3 mg/dL above baseline, creat increase >1.5x baseline presumed to have occurred within past 7 days PTA -Suspect due to prerenal/volume depletion from decreased intake and being on the floor.  CK is not high enough to cause pigment nephropathy at this time - Creatinine 1.76 on admission - creat improved to 1.25 by discharge  Rhabdomyolysis-resolved as of 02/25/2022 -  Etiology presumed from being down on the floor after falling out of bed - Initial CK 669 and is downtrending with fluids  Hyponatremia-resolved as of 02/24/2022 - Mild and due to hypovolemia -Continue fluids  Physical deconditioning - Patient too weak to get up after falling out of bed - Family reports that she did fairly well with physical therapy last time she went but upon returning home she did not stay as active and again became more deconditioned - family and patient accepted SNF bed offer  Elevated TSH - possibly euythroid sick  - TSH elevated 6.32, Free T4 normal 1.11, Total T3 low 66 - no history of hypothyroidism noted; no thyroid meds noted - repeat thyroid studies in 4-6 weeks and re-evaluate at that time   Hypokalemia - Replete as needed  Normocytic anemia - Baseline appears to be around 11 g/dL - Currently around baseline  HTN (hypertension) - Continue current regimen  Elevated troponin-resolved as of 02/24/2022 - Suspected demand and some possible contribution from AKI - Patient chest pain-free - No further work-up - Troponin has been trended and is downtrending as well       Principal Diagnosis: AKI (acute kidney injury) [facility])  Discharge Diagnoses: Active Hospital Problems   Diagnosis Date Noted   AKI (acute kidney injury) (HCC) 02/23/2022    Priority: 1.   Physical deconditioning     Priority: 3.   Elevated TSH 02/26/2022  Priority: 4.   Normocytic anemia 02/24/2022   Hypokalemia    HTN (hypertension) 09/19/2006    Resolved Hospital Problems   Diagnosis Date Noted Date Resolved   Rhabdomyolysis 02/24/2022 02/25/2022    Priority: 2.   Hyponatremia 07/11/2019 02/24/2022    Priority: 2.   Elevated troponin 02/24/2022 02/24/2022     Discharge Instructions     Diet - low sodium heart healthy   Complete by: As directed    Increase activity slowly   Complete by: As directed       Allergies as of 02/27/2022   No Known Allergies       Medication List     STOP taking these medications    CALCIUM PO   furosemide 20 MG tablet Commonly known as: LASIX   losartan-hydrochlorothiazide 50-12.5 MG tablet Commonly known as: HYZAAR       TAKE these medications    acetaminophen 325 MG tablet Commonly known as: TYLENOL Take 2 tablets (650 mg total) by mouth every 6 (six) hours as needed for mild pain (or Fever >/= 101).   ferrous sulfate 325 (65 FE) MG tablet Take 1 tablet (325 mg total) by mouth daily with breakfast.   folic acid 1 MG tablet Commonly known as: FOLVITE Take 1 mg by mouth daily.   losartan 25 MG tablet Commonly known as: COZAAR Take 3 tablets (75 mg total) by mouth daily.   metoprolol succinate 25 MG 24 hr tablet Commonly known as: TOPROL-XL Take 1 tablet (25 mg total) by mouth daily. What changed:  medication strength when to take this   multivitamin tablet Take 1 tablet by mouth daily.   sertraline 25 MG tablet Commonly known as: ZOLOFT Take 25 mg by mouth daily.        No Known Allergies  Consultations:   Procedures:   Discharge Exam: BP (!) 191/57   Pulse (!) 54   Temp 97.8 F (36.6 C) (Oral)   Resp 18   Ht 4\' 11"  (1.499 m)   Wt 46.9 kg   SpO2 100%   BMI 20.88 kg/m  Physical Exam Constitutional:      General: She is not in acute distress.    Appearance: Normal appearance. She is not ill-appearing.  HENT:     Head: Normocephalic and atraumatic.     Mouth/Throat:     Mouth: Mucous membranes are moist.  Eyes:     Extraocular Movements: Extraocular movements intact.  Cardiovascular:     Rate and Rhythm: Normal rate and regular rhythm.  Pulmonary:     Effort: Pulmonary effort is normal.     Breath sounds: Normal breath sounds.  Abdominal:     General: Bowel sounds are normal. There is no distension.     Palpations: Abdomen is soft.     Tenderness: There is no abdominal tenderness.  Musculoskeletal:        General: Normal range of motion.     Cervical  back: Normal range of motion and neck supple.  Skin:    General: Skin is warm and dry.  Neurological:     Mental Status: She is alert. Mental status is at baseline.  Psychiatric:        Mood and Affect: Mood normal.        Behavior: Behavior normal.      The results of significant diagnostics from this hospitalization (including imaging, microbiology, ancillary and laboratory) are listed below for reference.   Microbiology: No results found for this or any  previous visit (from the past 240 hour(s)).   Labs: BNP (last 3 results) No results for input(s): "BNP" in the last 8760 hours. Basic Metabolic Panel: Recent Labs  Lab 02/23/22 1736 02/24/22 0042 02/25/22 0430 02/26/22 0324  NA 129* 137 132* 131*  K 3.4* 3.4* 3.6 4.4  CL 94* 104 104 105  CO2 24 26 22 23   GLUCOSE 116* 99 95 93  BUN 50* 44* 28* 25*  CREATININE 1.76* 1.69* 1.13* 1.25*  CALCIUM 9.3 9.2 8.4* 8.3*  MG  --  2.0 1.9 1.6*   Liver Function Tests: Recent Labs  Lab 02/23/22 1736 02/24/22 0042  AST 53* 41  ALT 29 24  ALKPHOS 93 78  BILITOT 0.6 0.6  PROT 7.5 6.4*  ALBUMIN 4.2 3.3*   No results for input(s): "LIPASE", "AMYLASE" in the last 168 hours. No results for input(s): "AMMONIA" in the last 168 hours. CBC: Recent Labs  Lab 02/23/22 1736 02/24/22 0042 02/25/22 0430 02/26/22 0324  WBC 10.1 7.9 5.8 6.0  NEUTROABS 7.8*  --  3.8 3.8  HGB 11.5* 10.1* 9.4* 9.0*  HCT 32.3* 28.8* 27.4* 26.0*  MCV 98.8 99.7 102.2* 102.0*  PLT 263 217 199 178   Cardiac Enzymes: Recent Labs  Lab 02/23/22 1736 02/24/22 0819  CKTOTAL 669* 315*   BNP: Invalid input(s): "POCBNP" CBG: No results for input(s): "GLUCAP" in the last 168 hours. D-Dimer No results for input(s): "DDIMER" in the last 72 hours. Hgb A1c No results for input(s): "HGBA1C" in the last 72 hours. Lipid Profile No results for input(s): "CHOL", "HDL", "LDLCALC", "TRIG", "CHOLHDL", "LDLDIRECT" in the last 72 hours. Thyroid function studies No  results for input(s): "TSH", "T4TOTAL", "T3FREE", "THYROIDAB" in the last 72 hours.  Invalid input(s): "FREET3" Anemia work up No results for input(s): "VITAMINB12", "FOLATE", "FERRITIN", "TIBC", "IRON", "RETICCTPCT" in the last 72 hours. Urinalysis    Component Value Date/Time   COLORURINE YELLOW 11/03/2021 2049   APPEARANCEUR HAZY (A) 11/03/2021 2049   LABSPEC 1.011 11/03/2021 2049   PHURINE 7.0 11/03/2021 2049   GLUCOSEU NEGATIVE 11/03/2021 2049   HGBUR SMALL (A) 11/03/2021 2049   BILIRUBINUR NEGATIVE 11/03/2021 2049   KETONESUR NEGATIVE 11/03/2021 2049   PROTEINUR 30 (A) 11/03/2021 2049   NITRITE NEGATIVE 11/03/2021 2049   LEUKOCYTESUR MODERATE (A) 11/03/2021 2049   Sepsis Labs Recent Labs  Lab 02/23/22 1736 02/24/22 0042 02/25/22 0430 02/26/22 0324  WBC 10.1 7.9 5.8 6.0   Microbiology No results found for this or any previous visit (from the past 240 hour(s)).  Procedures/Studies: DG Chest Portable 1 View  Result Date: 02/23/2022 CLINICAL DATA:  Weakness and recent fall. EXAM: PORTABLE CHEST 1 VIEW COMPARISON:  November 03, 2021 FINDINGS: The heart size and mediastinal contours are within normal limits. There is marked severity calcification and tortuosity of the thoracic aorta. Both lungs are clear. Chronic left-sided rib fractures are seen. The visualized skeletal structures are otherwise unremarkable. IMPRESSION: 1. No active cardiopulmonary disease. Electronically Signed   By: November 05, 2021 M.D.   On: 02/23/2022 21:22   DG Hips Bilat W or Wo Pelvis 2 Views  Result Date: 02/23/2022 CLINICAL DATA:  Fall EXAM: DG HIP (WITH OR WITHOUT PELVIS) 2V BILAT COMPARISON:  02/11/2015 FINDINGS: Postoperative changes in the right femur. Hip joints are symmetric. No acute bony abnormality. Specifically, no fracture, subluxation, or dislocation. IMPRESSION: No acute bony abnormality. Electronically Signed   By: 02/13/2015 M.D.   On: 02/23/2022 20:04   DG Sacrum/Coccyx  Result  Date:  02/23/2022 CLINICAL DATA:  Fall EXAM: SACRUM AND COCCYX - 2+ VIEW COMPARISON:  11/03/2021 FINDINGS: Postoperative changes in the right femur. Angulation noted in the lower sacrum/upper coccyx appears chronic and may be related to congenital deformity or old injury. No definite acute fracture. Hip joints are symmetric. IMPRESSION: Angulation in the lower sacrum/upper coccyx, favor congenital deformity versus old injury. No definite acute fracture. Electronically Signed   By: Rolm Baptise M.D.   On: 02/23/2022 20:02     Time coordinating discharge: Over 30 minutes    Dwyane Dee, MD  [facility] 02/27/2022, 9:21 AM

## 2022-02-28 DIAGNOSIS — F419 Anxiety disorder, unspecified: Secondary | ICD-10-CM | POA: Diagnosis not present

## 2022-02-28 DIAGNOSIS — R7989 Other specified abnormal findings of blood chemistry: Secondary | ICD-10-CM | POA: Diagnosis not present

## 2022-02-28 DIAGNOSIS — M5030 Other cervical disc degeneration, unspecified cervical region: Secondary | ICD-10-CM | POA: Diagnosis not present

## 2022-02-28 DIAGNOSIS — D638 Anemia in other chronic diseases classified elsewhere: Secondary | ICD-10-CM | POA: Diagnosis not present

## 2022-02-28 DIAGNOSIS — R296 Repeated falls: Secondary | ICD-10-CM | POA: Diagnosis not present

## 2022-03-01 ENCOUNTER — Ambulatory Visit: Payer: Medicare HMO | Admitting: Student

## 2022-03-01 DIAGNOSIS — D638 Anemia in other chronic diseases classified elsewhere: Secondary | ICD-10-CM | POA: Diagnosis not present

## 2022-03-01 DIAGNOSIS — M5030 Other cervical disc degeneration, unspecified cervical region: Secondary | ICD-10-CM | POA: Diagnosis not present

## 2022-03-01 DIAGNOSIS — M6281 Muscle weakness (generalized): Secondary | ICD-10-CM | POA: Diagnosis not present

## 2022-03-01 DIAGNOSIS — R2689 Other abnormalities of gait and mobility: Secondary | ICD-10-CM | POA: Diagnosis not present

## 2022-03-01 DIAGNOSIS — I471 Supraventricular tachycardia: Secondary | ICD-10-CM | POA: Diagnosis not present

## 2022-03-01 DIAGNOSIS — I10 Essential (primary) hypertension: Secondary | ICD-10-CM | POA: Diagnosis not present

## 2022-03-01 DIAGNOSIS — Z9181 History of falling: Secondary | ICD-10-CM | POA: Diagnosis not present

## 2022-03-01 DIAGNOSIS — E46 Unspecified protein-calorie malnutrition: Secondary | ICD-10-CM | POA: Diagnosis not present

## 2022-03-01 DIAGNOSIS — F419 Anxiety disorder, unspecified: Secondary | ICD-10-CM | POA: Diagnosis not present

## 2022-03-05 DIAGNOSIS — S22089D Unspecified fracture of T11-T12 vertebra, subsequent encounter for fracture with routine healing: Secondary | ICD-10-CM | POA: Diagnosis not present

## 2022-03-05 DIAGNOSIS — R2689 Other abnormalities of gait and mobility: Secondary | ICD-10-CM | POA: Diagnosis not present

## 2022-03-05 DIAGNOSIS — E871 Hypo-osmolality and hyponatremia: Secondary | ICD-10-CM | POA: Diagnosis not present

## 2022-03-05 DIAGNOSIS — Z9181 History of falling: Secondary | ICD-10-CM | POA: Diagnosis not present

## 2022-03-05 DIAGNOSIS — I471 Supraventricular tachycardia: Secondary | ICD-10-CM | POA: Diagnosis not present

## 2022-03-05 DIAGNOSIS — R296 Repeated falls: Secondary | ICD-10-CM | POA: Diagnosis not present

## 2022-03-05 DIAGNOSIS — M5030 Other cervical disc degeneration, unspecified cervical region: Secondary | ICD-10-CM | POA: Diagnosis not present

## 2022-03-06 DIAGNOSIS — R600 Localized edema: Secondary | ICD-10-CM | POA: Diagnosis not present

## 2022-03-08 DIAGNOSIS — R600 Localized edema: Secondary | ICD-10-CM | POA: Diagnosis not present

## 2022-03-12 DIAGNOSIS — N179 Acute kidney failure, unspecified: Secondary | ICD-10-CM | POA: Diagnosis not present

## 2022-03-12 DIAGNOSIS — D638 Anemia in other chronic diseases classified elsewhere: Secondary | ICD-10-CM | POA: Diagnosis not present

## 2022-03-12 DIAGNOSIS — E871 Hypo-osmolality and hyponatremia: Secondary | ICD-10-CM | POA: Diagnosis not present

## 2022-03-12 DIAGNOSIS — R600 Localized edema: Secondary | ICD-10-CM | POA: Diagnosis not present

## 2022-03-14 DIAGNOSIS — J069 Acute upper respiratory infection, unspecified: Secondary | ICD-10-CM | POA: Diagnosis not present

## 2022-03-14 DIAGNOSIS — D638 Anemia in other chronic diseases classified elsewhere: Secondary | ICD-10-CM | POA: Diagnosis not present

## 2022-03-14 DIAGNOSIS — E871 Hypo-osmolality and hyponatremia: Secondary | ICD-10-CM | POA: Diagnosis not present

## 2022-03-14 DIAGNOSIS — R600 Localized edema: Secondary | ICD-10-CM | POA: Diagnosis not present

## 2022-03-14 DIAGNOSIS — E860 Dehydration: Secondary | ICD-10-CM | POA: Diagnosis not present

## 2022-03-14 DIAGNOSIS — R7989 Other specified abnormal findings of blood chemistry: Secondary | ICD-10-CM | POA: Diagnosis not present

## 2022-03-14 DIAGNOSIS — R262 Difficulty in walking, not elsewhere classified: Secondary | ICD-10-CM | POA: Diagnosis not present

## 2022-03-14 DIAGNOSIS — F419 Anxiety disorder, unspecified: Secondary | ICD-10-CM | POA: Diagnosis not present

## 2022-03-14 DIAGNOSIS — M6281 Muscle weakness (generalized): Secondary | ICD-10-CM | POA: Diagnosis not present

## 2022-03-29 ENCOUNTER — Telehealth: Payer: Self-pay

## 2022-03-29 NOTE — Telephone Encounter (Signed)
(  3:22 pm) SW received a call from patient's son-[name]. He advised patient has been in [place] with him for almost a month. She is not happy there and wants to return home. She does receive physical therapy at his home, but she wants to go home. He has been told that patient can not stay by herself and wanted to see what services were available. SW provided education regarding Medicaid ineligibility and how this impact LTC cost. He stated that patient could not afford to pay out of pocket at all. SW explained that he needed make some hard choices that is in his mother's best interest of her safety. He agreed and thanked SW for listening to him. SW advised him that patient will be discharged as she is no longer in our service area while she is living with him in [place].  *Patient discharged from palliative care services.

## 2022-04-13 DIAGNOSIS — S0990XA Unspecified injury of head, initial encounter: Secondary | ICD-10-CM | POA: Diagnosis not present

## 2022-04-13 DIAGNOSIS — E871 Hypo-osmolality and hyponatremia: Secondary | ICD-10-CM | POA: Diagnosis not present

## 2022-04-13 DIAGNOSIS — Z743 Need for continuous supervision: Secondary | ICD-10-CM | POA: Diagnosis not present

## 2022-04-13 DIAGNOSIS — M545 Low back pain, unspecified: Secondary | ICD-10-CM | POA: Diagnosis not present

## 2022-04-13 DIAGNOSIS — M546 Pain in thoracic spine: Secondary | ICD-10-CM | POA: Diagnosis not present

## 2022-04-13 DIAGNOSIS — R0781 Pleurodynia: Secondary | ICD-10-CM | POA: Diagnosis not present

## 2022-04-13 DIAGNOSIS — M549 Dorsalgia, unspecified: Secondary | ICD-10-CM | POA: Diagnosis not present

## 2022-04-13 DIAGNOSIS — R300 Dysuria: Secondary | ICD-10-CM | POA: Diagnosis not present

## 2022-04-13 DIAGNOSIS — R531 Weakness: Secondary | ICD-10-CM | POA: Diagnosis not present

## 2022-04-14 DIAGNOSIS — D631 Anemia in chronic kidney disease: Secondary | ICD-10-CM | POA: Diagnosis not present

## 2022-04-14 DIAGNOSIS — N1832 Chronic kidney disease, stage 3b: Secondary | ICD-10-CM | POA: Diagnosis not present

## 2022-04-14 DIAGNOSIS — E039 Hypothyroidism, unspecified: Secondary | ICD-10-CM | POA: Diagnosis not present

## 2022-04-14 DIAGNOSIS — D539 Nutritional anemia, unspecified: Secondary | ICD-10-CM | POA: Diagnosis not present

## 2022-04-14 DIAGNOSIS — W19XXXA Unspecified fall, initial encounter: Secondary | ICD-10-CM | POA: Diagnosis not present

## 2022-04-14 DIAGNOSIS — S22089A Unspecified fracture of T11-T12 vertebra, initial encounter for closed fracture: Secondary | ICD-10-CM | POA: Diagnosis not present

## 2022-04-14 DIAGNOSIS — F0390 Unspecified dementia without behavioral disturbance: Secondary | ICD-10-CM | POA: Diagnosis not present

## 2022-04-14 DIAGNOSIS — R5383 Other fatigue: Secondary | ICD-10-CM | POA: Diagnosis not present

## 2022-04-14 DIAGNOSIS — S22089D Unspecified fracture of T11-T12 vertebra, subsequent encounter for fracture with routine healing: Secondary | ICD-10-CM | POA: Diagnosis not present

## 2022-04-14 DIAGNOSIS — M25552 Pain in left hip: Secondary | ICD-10-CM | POA: Diagnosis not present

## 2022-04-14 DIAGNOSIS — D6489 Other specified anemias: Secondary | ICD-10-CM | POA: Diagnosis not present

## 2022-04-14 DIAGNOSIS — M4854XA Collapsed vertebra, not elsewhere classified, thoracic region, initial encounter for fracture: Secondary | ICD-10-CM | POA: Diagnosis not present

## 2022-04-14 DIAGNOSIS — F32A Depression, unspecified: Secondary | ICD-10-CM | POA: Diagnosis not present

## 2022-04-14 DIAGNOSIS — R262 Difficulty in walking, not elsewhere classified: Secondary | ICD-10-CM | POA: Diagnosis not present

## 2022-04-14 DIAGNOSIS — E861 Hypovolemia: Secondary | ICD-10-CM | POA: Diagnosis not present

## 2022-04-14 DIAGNOSIS — G9341 Metabolic encephalopathy: Secondary | ICD-10-CM | POA: Diagnosis not present

## 2022-04-14 DIAGNOSIS — R531 Weakness: Secondary | ICD-10-CM | POA: Diagnosis not present

## 2022-04-14 DIAGNOSIS — S22000A Wedge compression fracture of unspecified thoracic vertebra, initial encounter for closed fracture: Secondary | ICD-10-CM | POA: Diagnosis not present

## 2022-04-14 DIAGNOSIS — I10 Essential (primary) hypertension: Secondary | ICD-10-CM | POA: Diagnosis not present

## 2022-04-14 DIAGNOSIS — E871 Hypo-osmolality and hyponatremia: Secondary | ICD-10-CM | POA: Diagnosis not present

## 2022-04-14 DIAGNOSIS — G928 Other toxic encephalopathy: Secondary | ICD-10-CM | POA: Diagnosis not present

## 2022-04-14 DIAGNOSIS — N3000 Acute cystitis without hematuria: Secondary | ICD-10-CM | POA: Diagnosis not present

## 2022-04-14 DIAGNOSIS — M549 Dorsalgia, unspecified: Secondary | ICD-10-CM | POA: Diagnosis not present

## 2022-04-14 DIAGNOSIS — E878 Other disorders of electrolyte and fluid balance, not elsewhere classified: Secondary | ICD-10-CM | POA: Diagnosis not present

## 2022-04-14 DIAGNOSIS — N390 Urinary tract infection, site not specified: Secondary | ICD-10-CM | POA: Diagnosis not present

## 2022-04-14 DIAGNOSIS — S22080A Wedge compression fracture of T11-T12 vertebra, initial encounter for closed fracture: Secondary | ICD-10-CM | POA: Diagnosis not present

## 2022-04-15 DIAGNOSIS — E871 Hypo-osmolality and hyponatremia: Secondary | ICD-10-CM | POA: Diagnosis not present

## 2022-04-15 DIAGNOSIS — W19XXXA Unspecified fall, initial encounter: Secondary | ICD-10-CM | POA: Diagnosis not present

## 2022-04-15 DIAGNOSIS — S22080A Wedge compression fracture of T11-T12 vertebra, initial encounter for closed fracture: Secondary | ICD-10-CM | POA: Diagnosis not present

## 2022-04-15 DIAGNOSIS — D539 Nutritional anemia, unspecified: Secondary | ICD-10-CM | POA: Diagnosis not present

## 2022-04-15 DIAGNOSIS — N3000 Acute cystitis without hematuria: Secondary | ICD-10-CM | POA: Diagnosis not present

## 2022-04-15 DIAGNOSIS — S22000A Wedge compression fracture of unspecified thoracic vertebra, initial encounter for closed fracture: Secondary | ICD-10-CM | POA: Diagnosis not present

## 2022-04-16 DIAGNOSIS — D539 Nutritional anemia, unspecified: Secondary | ICD-10-CM | POA: Diagnosis not present

## 2022-04-16 DIAGNOSIS — S22000A Wedge compression fracture of unspecified thoracic vertebra, initial encounter for closed fracture: Secondary | ICD-10-CM | POA: Diagnosis not present

## 2022-04-16 DIAGNOSIS — E871 Hypo-osmolality and hyponatremia: Secondary | ICD-10-CM | POA: Diagnosis not present

## 2022-04-16 DIAGNOSIS — N3000 Acute cystitis without hematuria: Secondary | ICD-10-CM | POA: Diagnosis not present

## 2022-04-17 DIAGNOSIS — S22000A Wedge compression fracture of unspecified thoracic vertebra, initial encounter for closed fracture: Secondary | ICD-10-CM | POA: Diagnosis not present

## 2022-04-17 DIAGNOSIS — E871 Hypo-osmolality and hyponatremia: Secondary | ICD-10-CM | POA: Diagnosis not present

## 2022-04-17 DIAGNOSIS — S22080A Wedge compression fracture of T11-T12 vertebra, initial encounter for closed fracture: Secondary | ICD-10-CM | POA: Diagnosis not present

## 2022-04-17 DIAGNOSIS — D539 Nutritional anemia, unspecified: Secondary | ICD-10-CM | POA: Diagnosis not present

## 2022-04-17 DIAGNOSIS — N3000 Acute cystitis without hematuria: Secondary | ICD-10-CM | POA: Diagnosis not present

## 2022-04-17 DIAGNOSIS — W19XXXA Unspecified fall, initial encounter: Secondary | ICD-10-CM | POA: Diagnosis not present

## 2022-04-18 DIAGNOSIS — D539 Nutritional anemia, unspecified: Secondary | ICD-10-CM | POA: Diagnosis not present

## 2022-04-18 DIAGNOSIS — E871 Hypo-osmolality and hyponatremia: Secondary | ICD-10-CM | POA: Diagnosis not present

## 2022-04-18 DIAGNOSIS — N3000 Acute cystitis without hematuria: Secondary | ICD-10-CM | POA: Diagnosis not present

## 2022-04-18 DIAGNOSIS — S22000A Wedge compression fracture of unspecified thoracic vertebra, initial encounter for closed fracture: Secondary | ICD-10-CM | POA: Diagnosis not present

## 2022-04-19 DIAGNOSIS — N390 Urinary tract infection, site not specified: Secondary | ICD-10-CM | POA: Diagnosis not present

## 2022-04-19 DIAGNOSIS — G9341 Metabolic encephalopathy: Secondary | ICD-10-CM | POA: Diagnosis not present

## 2022-04-19 DIAGNOSIS — D649 Anemia, unspecified: Secondary | ICD-10-CM | POA: Diagnosis not present

## 2022-04-19 DIAGNOSIS — I129 Hypertensive chronic kidney disease with stage 1 through stage 4 chronic kidney disease, or unspecified chronic kidney disease: Secondary | ICD-10-CM | POA: Diagnosis not present

## 2022-04-19 DIAGNOSIS — E785 Hyperlipidemia, unspecified: Secondary | ICD-10-CM | POA: Diagnosis not present

## 2022-04-19 DIAGNOSIS — R262 Difficulty in walking, not elsewhere classified: Secondary | ICD-10-CM | POA: Diagnosis not present

## 2022-04-19 DIAGNOSIS — E871 Hypo-osmolality and hyponatremia: Secondary | ICD-10-CM | POA: Diagnosis not present

## 2022-04-19 DIAGNOSIS — H9190 Unspecified hearing loss, unspecified ear: Secondary | ICD-10-CM | POA: Diagnosis not present

## 2022-04-19 DIAGNOSIS — Z01818 Encounter for other preprocedural examination: Secondary | ICD-10-CM | POA: Diagnosis not present

## 2022-04-19 DIAGNOSIS — E039 Hypothyroidism, unspecified: Secondary | ICD-10-CM | POA: Diagnosis not present

## 2022-04-19 DIAGNOSIS — E7800 Pure hypercholesterolemia, unspecified: Secondary | ICD-10-CM | POA: Diagnosis not present

## 2022-04-19 DIAGNOSIS — F419 Anxiety disorder, unspecified: Secondary | ICD-10-CM | POA: Diagnosis not present

## 2022-04-19 DIAGNOSIS — S22080G Wedge compression fracture of T11-T12 vertebra, subsequent encounter for fracture with delayed healing: Secondary | ICD-10-CM | POA: Diagnosis not present

## 2022-04-19 DIAGNOSIS — Z8673 Personal history of transient ischemic attack (TIA), and cerebral infarction without residual deficits: Secondary | ICD-10-CM | POA: Diagnosis not present

## 2022-04-19 DIAGNOSIS — N1832 Chronic kidney disease, stage 3b: Secondary | ICD-10-CM | POA: Diagnosis not present

## 2022-04-19 DIAGNOSIS — N3000 Acute cystitis without hematuria: Secondary | ICD-10-CM | POA: Diagnosis not present

## 2022-04-19 DIAGNOSIS — L821 Other seborrheic keratosis: Secondary | ICD-10-CM | POA: Diagnosis not present

## 2022-04-19 DIAGNOSIS — Z7401 Bed confinement status: Secondary | ICD-10-CM | POA: Diagnosis not present

## 2022-04-19 DIAGNOSIS — D539 Nutritional anemia, unspecified: Secondary | ICD-10-CM | POA: Diagnosis not present

## 2022-04-19 DIAGNOSIS — S22089D Unspecified fracture of T11-T12 vertebra, subsequent encounter for fracture with routine healing: Secondary | ICD-10-CM | POA: Diagnosis not present

## 2022-04-19 DIAGNOSIS — L908 Other atrophic disorders of skin: Secondary | ICD-10-CM | POA: Diagnosis not present

## 2022-04-19 DIAGNOSIS — F32A Depression, unspecified: Secondary | ICD-10-CM | POA: Diagnosis not present

## 2022-04-19 DIAGNOSIS — R64 Cachexia: Secondary | ICD-10-CM | POA: Diagnosis not present

## 2022-04-19 DIAGNOSIS — I10 Essential (primary) hypertension: Secondary | ICD-10-CM | POA: Diagnosis not present

## 2022-04-19 DIAGNOSIS — L84 Corns and callosities: Secondary | ICD-10-CM | POA: Diagnosis not present

## 2022-04-19 DIAGNOSIS — N183 Chronic kidney disease, stage 3 unspecified: Secondary | ICD-10-CM | POA: Diagnosis not present

## 2022-04-19 DIAGNOSIS — L853 Xerosis cutis: Secondary | ICD-10-CM | POA: Diagnosis not present

## 2022-04-19 DIAGNOSIS — D6489 Other specified anemias: Secondary | ICD-10-CM | POA: Diagnosis not present

## 2022-04-19 DIAGNOSIS — S22000A Wedge compression fracture of unspecified thoracic vertebra, initial encounter for closed fracture: Secondary | ICD-10-CM | POA: Diagnosis not present

## 2022-04-19 DIAGNOSIS — F329 Major depressive disorder, single episode, unspecified: Secondary | ICD-10-CM | POA: Diagnosis not present

## 2022-04-19 DIAGNOSIS — R6889 Other general symptoms and signs: Secondary | ICD-10-CM | POA: Diagnosis not present

## 2022-04-19 DIAGNOSIS — Z743 Need for continuous supervision: Secondary | ICD-10-CM | POA: Diagnosis not present

## 2022-04-19 DIAGNOSIS — Z01812 Encounter for preprocedural laboratory examination: Secondary | ICD-10-CM | POA: Diagnosis not present

## 2022-04-19 DIAGNOSIS — R5383 Other fatigue: Secondary | ICD-10-CM | POA: Diagnosis not present

## 2022-04-20 DIAGNOSIS — D649 Anemia, unspecified: Secondary | ICD-10-CM | POA: Diagnosis not present

## 2022-04-20 DIAGNOSIS — R262 Difficulty in walking, not elsewhere classified: Secondary | ICD-10-CM | POA: Diagnosis not present

## 2022-04-20 DIAGNOSIS — E785 Hyperlipidemia, unspecified: Secondary | ICD-10-CM | POA: Diagnosis not present

## 2022-04-20 DIAGNOSIS — I10 Essential (primary) hypertension: Secondary | ICD-10-CM | POA: Diagnosis not present

## 2022-04-20 DIAGNOSIS — S22000A Wedge compression fracture of unspecified thoracic vertebra, initial encounter for closed fracture: Secondary | ICD-10-CM | POA: Diagnosis not present

## 2022-04-20 DIAGNOSIS — E039 Hypothyroidism, unspecified: Secondary | ICD-10-CM | POA: Diagnosis not present

## 2022-04-20 DIAGNOSIS — N183 Chronic kidney disease, stage 3 unspecified: Secondary | ICD-10-CM | POA: Diagnosis not present

## 2022-04-21 DIAGNOSIS — I10 Essential (primary) hypertension: Secondary | ICD-10-CM | POA: Diagnosis not present

## 2022-04-21 DIAGNOSIS — D6489 Other specified anemias: Secondary | ICD-10-CM | POA: Diagnosis not present

## 2022-04-21 DIAGNOSIS — E039 Hypothyroidism, unspecified: Secondary | ICD-10-CM | POA: Diagnosis not present

## 2022-04-21 DIAGNOSIS — N1832 Chronic kidney disease, stage 3b: Secondary | ICD-10-CM | POA: Diagnosis not present

## 2022-04-22 DIAGNOSIS — F329 Major depressive disorder, single episode, unspecified: Secondary | ICD-10-CM | POA: Diagnosis not present

## 2022-04-22 DIAGNOSIS — E785 Hyperlipidemia, unspecified: Secondary | ICD-10-CM | POA: Diagnosis not present

## 2022-04-22 DIAGNOSIS — L908 Other atrophic disorders of skin: Secondary | ICD-10-CM | POA: Diagnosis not present

## 2022-04-22 DIAGNOSIS — R5383 Other fatigue: Secondary | ICD-10-CM | POA: Diagnosis not present

## 2022-04-22 DIAGNOSIS — N390 Urinary tract infection, site not specified: Secondary | ICD-10-CM | POA: Diagnosis not present

## 2022-04-22 DIAGNOSIS — L821 Other seborrheic keratosis: Secondary | ICD-10-CM | POA: Diagnosis not present

## 2022-04-22 DIAGNOSIS — R262 Difficulty in walking, not elsewhere classified: Secondary | ICD-10-CM | POA: Diagnosis not present

## 2022-04-22 DIAGNOSIS — L84 Corns and callosities: Secondary | ICD-10-CM | POA: Diagnosis not present

## 2022-04-22 DIAGNOSIS — H9190 Unspecified hearing loss, unspecified ear: Secondary | ICD-10-CM | POA: Diagnosis not present

## 2022-04-22 DIAGNOSIS — Z743 Need for continuous supervision: Secondary | ICD-10-CM | POA: Diagnosis not present

## 2022-04-22 DIAGNOSIS — I10 Essential (primary) hypertension: Secondary | ICD-10-CM | POA: Diagnosis not present

## 2022-04-22 DIAGNOSIS — Z7401 Bed confinement status: Secondary | ICD-10-CM | POA: Diagnosis not present

## 2022-04-22 DIAGNOSIS — F32A Depression, unspecified: Secondary | ICD-10-CM | POA: Diagnosis not present

## 2022-04-22 DIAGNOSIS — D649 Anemia, unspecified: Secondary | ICD-10-CM | POA: Diagnosis not present

## 2022-04-22 DIAGNOSIS — D6489 Other specified anemias: Secondary | ICD-10-CM | POA: Diagnosis not present

## 2022-04-22 DIAGNOSIS — L853 Xerosis cutis: Secondary | ICD-10-CM | POA: Diagnosis not present

## 2022-04-22 DIAGNOSIS — Z8673 Personal history of transient ischemic attack (TIA), and cerebral infarction without residual deficits: Secondary | ICD-10-CM | POA: Diagnosis not present

## 2022-04-22 DIAGNOSIS — R64 Cachexia: Secondary | ICD-10-CM | POA: Diagnosis not present

## 2022-04-22 DIAGNOSIS — S22089D Unspecified fracture of T11-T12 vertebra, subsequent encounter for fracture with routine healing: Secondary | ICD-10-CM | POA: Diagnosis not present

## 2022-04-22 DIAGNOSIS — N1832 Chronic kidney disease, stage 3b: Secondary | ICD-10-CM | POA: Diagnosis not present

## 2022-04-22 DIAGNOSIS — F419 Anxiety disorder, unspecified: Secondary | ICD-10-CM | POA: Diagnosis not present

## 2022-04-22 DIAGNOSIS — Z01812 Encounter for preprocedural laboratory examination: Secondary | ICD-10-CM | POA: Diagnosis not present

## 2022-04-22 DIAGNOSIS — R6889 Other general symptoms and signs: Secondary | ICD-10-CM | POA: Diagnosis not present

## 2022-04-22 DIAGNOSIS — E039 Hypothyroidism, unspecified: Secondary | ICD-10-CM | POA: Diagnosis not present

## 2022-04-22 DIAGNOSIS — Z01818 Encounter for other preprocedural examination: Secondary | ICD-10-CM | POA: Diagnosis not present

## 2022-04-22 DIAGNOSIS — G9341 Metabolic encephalopathy: Secondary | ICD-10-CM | POA: Diagnosis not present

## 2022-04-22 DIAGNOSIS — E871 Hypo-osmolality and hyponatremia: Secondary | ICD-10-CM | POA: Diagnosis not present

## 2022-04-22 DIAGNOSIS — D539 Nutritional anemia, unspecified: Secondary | ICD-10-CM | POA: Diagnosis not present

## 2022-04-22 DIAGNOSIS — I129 Hypertensive chronic kidney disease with stage 1 through stage 4 chronic kidney disease, or unspecified chronic kidney disease: Secondary | ICD-10-CM | POA: Diagnosis not present

## 2022-04-22 DIAGNOSIS — S22080G Wedge compression fracture of T11-T12 vertebra, subsequent encounter for fracture with delayed healing: Secondary | ICD-10-CM | POA: Diagnosis not present

## 2022-04-22 DIAGNOSIS — E7800 Pure hypercholesterolemia, unspecified: Secondary | ICD-10-CM | POA: Diagnosis not present

## 2022-04-22 DIAGNOSIS — S22000A Wedge compression fracture of unspecified thoracic vertebra, initial encounter for closed fracture: Secondary | ICD-10-CM | POA: Diagnosis not present

## 2022-04-22 DIAGNOSIS — N183 Chronic kidney disease, stage 3 unspecified: Secondary | ICD-10-CM | POA: Diagnosis not present

## 2022-04-23 DIAGNOSIS — L853 Xerosis cutis: Secondary | ICD-10-CM | POA: Diagnosis not present

## 2022-04-23 DIAGNOSIS — L84 Corns and callosities: Secondary | ICD-10-CM | POA: Diagnosis not present

## 2022-04-23 DIAGNOSIS — L908 Other atrophic disorders of skin: Secondary | ICD-10-CM | POA: Diagnosis not present

## 2022-04-23 DIAGNOSIS — L821 Other seborrheic keratosis: Secondary | ICD-10-CM | POA: Diagnosis not present

## 2022-04-24 DIAGNOSIS — F329 Major depressive disorder, single episode, unspecified: Secondary | ICD-10-CM | POA: Diagnosis not present

## 2022-04-24 DIAGNOSIS — Z7401 Bed confinement status: Secondary | ICD-10-CM | POA: Diagnosis not present

## 2022-04-24 DIAGNOSIS — F419 Anxiety disorder, unspecified: Secondary | ICD-10-CM | POA: Diagnosis not present

## 2022-04-24 DIAGNOSIS — D539 Nutritional anemia, unspecified: Secondary | ICD-10-CM | POA: Diagnosis not present

## 2022-04-24 DIAGNOSIS — E7800 Pure hypercholesterolemia, unspecified: Secondary | ICD-10-CM | POA: Diagnosis not present

## 2022-04-24 DIAGNOSIS — R64 Cachexia: Secondary | ICD-10-CM | POA: Diagnosis not present

## 2022-04-24 DIAGNOSIS — E871 Hypo-osmolality and hyponatremia: Secondary | ICD-10-CM | POA: Diagnosis not present

## 2022-04-24 DIAGNOSIS — Z8673 Personal history of transient ischemic attack (TIA), and cerebral infarction without residual deficits: Secondary | ICD-10-CM | POA: Diagnosis not present

## 2022-04-24 DIAGNOSIS — N1832 Chronic kidney disease, stage 3b: Secondary | ICD-10-CM | POA: Diagnosis not present

## 2022-04-24 DIAGNOSIS — I129 Hypertensive chronic kidney disease with stage 1 through stage 4 chronic kidney disease, or unspecified chronic kidney disease: Secondary | ICD-10-CM | POA: Diagnosis not present

## 2022-04-24 DIAGNOSIS — H9190 Unspecified hearing loss, unspecified ear: Secondary | ICD-10-CM | POA: Diagnosis not present

## 2022-04-28 DIAGNOSIS — E785 Hyperlipidemia, unspecified: Secondary | ICD-10-CM | POA: Diagnosis not present

## 2022-04-28 DIAGNOSIS — R262 Difficulty in walking, not elsewhere classified: Secondary | ICD-10-CM | POA: Diagnosis not present

## 2022-04-28 DIAGNOSIS — E039 Hypothyroidism, unspecified: Secondary | ICD-10-CM | POA: Diagnosis not present

## 2022-04-28 DIAGNOSIS — N183 Chronic kidney disease, stage 3 unspecified: Secondary | ICD-10-CM | POA: Diagnosis not present

## 2022-04-28 DIAGNOSIS — S22000A Wedge compression fracture of unspecified thoracic vertebra, initial encounter for closed fracture: Secondary | ICD-10-CM | POA: Diagnosis not present

## 2022-04-28 DIAGNOSIS — I10 Essential (primary) hypertension: Secondary | ICD-10-CM | POA: Diagnosis not present

## 2022-04-28 DIAGNOSIS — D649 Anemia, unspecified: Secondary | ICD-10-CM | POA: Diagnosis not present

## 2022-05-01 DIAGNOSIS — S22080G Wedge compression fracture of T11-T12 vertebra, subsequent encounter for fracture with delayed healing: Secondary | ICD-10-CM | POA: Diagnosis not present

## 2022-05-01 DIAGNOSIS — Z01812 Encounter for preprocedural laboratory examination: Secondary | ICD-10-CM | POA: Diagnosis not present

## 2022-05-01 DIAGNOSIS — Z01818 Encounter for other preprocedural examination: Secondary | ICD-10-CM | POA: Diagnosis not present

## 2022-05-03 DIAGNOSIS — D6489 Other specified anemias: Secondary | ICD-10-CM | POA: Diagnosis not present

## 2022-05-03 DIAGNOSIS — I10 Essential (primary) hypertension: Secondary | ICD-10-CM | POA: Diagnosis not present

## 2022-05-03 DIAGNOSIS — N1832 Chronic kidney disease, stage 3b: Secondary | ICD-10-CM | POA: Diagnosis not present

## 2022-05-03 DIAGNOSIS — E039 Hypothyroidism, unspecified: Secondary | ICD-10-CM | POA: Diagnosis not present

## 2022-05-04 DIAGNOSIS — E039 Hypothyroidism, unspecified: Secondary | ICD-10-CM | POA: Diagnosis not present

## 2022-05-04 DIAGNOSIS — I10 Essential (primary) hypertension: Secondary | ICD-10-CM | POA: Diagnosis not present

## 2022-05-04 DIAGNOSIS — S22000A Wedge compression fracture of unspecified thoracic vertebra, initial encounter for closed fracture: Secondary | ICD-10-CM | POA: Diagnosis not present

## 2022-05-04 DIAGNOSIS — D649 Anemia, unspecified: Secondary | ICD-10-CM | POA: Diagnosis not present

## 2022-05-04 DIAGNOSIS — N183 Chronic kidney disease, stage 3 unspecified: Secondary | ICD-10-CM | POA: Diagnosis not present

## 2022-05-08 DIAGNOSIS — F419 Anxiety disorder, unspecified: Secondary | ICD-10-CM | POA: Diagnosis not present

## 2022-05-08 DIAGNOSIS — M545 Low back pain, unspecified: Secondary | ICD-10-CM | POA: Diagnosis not present

## 2022-05-08 DIAGNOSIS — N1832 Chronic kidney disease, stage 3b: Secondary | ICD-10-CM | POA: Diagnosis not present

## 2022-05-08 DIAGNOSIS — M4854XD Collapsed vertebra, not elsewhere classified, thoracic region, subsequent encounter for fracture with routine healing: Secondary | ICD-10-CM | POA: Diagnosis not present

## 2022-05-08 DIAGNOSIS — R5383 Other fatigue: Secondary | ICD-10-CM | POA: Diagnosis not present

## 2022-05-08 DIAGNOSIS — K5909 Other constipation: Secondary | ICD-10-CM | POA: Diagnosis not present

## 2022-05-08 DIAGNOSIS — D649 Anemia, unspecified: Secondary | ICD-10-CM | POA: Diagnosis not present

## 2022-05-08 DIAGNOSIS — H9193 Unspecified hearing loss, bilateral: Secondary | ICD-10-CM | POA: Diagnosis not present

## 2022-05-08 DIAGNOSIS — H04123 Dry eye syndrome of bilateral lacrimal glands: Secondary | ICD-10-CM | POA: Diagnosis not present

## 2022-05-11 DIAGNOSIS — D649 Anemia, unspecified: Secondary | ICD-10-CM | POA: Diagnosis not present

## 2022-05-18 DIAGNOSIS — E871 Hypo-osmolality and hyponatremia: Secondary | ICD-10-CM | POA: Diagnosis not present

## 2022-05-21 DIAGNOSIS — Z01818 Encounter for other preprocedural examination: Secondary | ICD-10-CM | POA: Diagnosis not present

## 2022-05-21 DIAGNOSIS — I517 Cardiomegaly: Secondary | ICD-10-CM | POA: Diagnosis not present

## 2022-05-21 DIAGNOSIS — Z01812 Encounter for preprocedural laboratory examination: Secondary | ICD-10-CM | POA: Diagnosis not present

## 2022-05-21 DIAGNOSIS — J439 Emphysema, unspecified: Secondary | ICD-10-CM | POA: Diagnosis not present

## 2022-05-24 DIAGNOSIS — Z0189 Encounter for other specified special examinations: Secondary | ICD-10-CM | POA: Diagnosis not present

## 2022-05-24 DIAGNOSIS — D649 Anemia, unspecified: Secondary | ICD-10-CM | POA: Diagnosis not present

## 2022-05-27 DIAGNOSIS — N1832 Chronic kidney disease, stage 3b: Secondary | ICD-10-CM | POA: Diagnosis not present

## 2022-05-27 DIAGNOSIS — I509 Heart failure, unspecified: Secondary | ICD-10-CM | POA: Diagnosis not present

## 2022-05-27 DIAGNOSIS — J9811 Atelectasis: Secondary | ICD-10-CM | POA: Diagnosis not present

## 2022-05-27 DIAGNOSIS — D631 Anemia in chronic kidney disease: Secondary | ICD-10-CM | POA: Diagnosis not present

## 2022-05-27 DIAGNOSIS — J90 Pleural effusion, not elsewhere classified: Secondary | ICD-10-CM | POA: Diagnosis not present

## 2022-05-27 DIAGNOSIS — Z8673 Personal history of transient ischemic attack (TIA), and cerebral infarction without residual deficits: Secondary | ICD-10-CM | POA: Diagnosis not present

## 2022-05-27 DIAGNOSIS — Z1152 Encounter for screening for COVID-19: Secondary | ICD-10-CM | POA: Diagnosis not present

## 2022-05-27 DIAGNOSIS — I130 Hypertensive heart and chronic kidney disease with heart failure and stage 1 through stage 4 chronic kidney disease, or unspecified chronic kidney disease: Secondary | ICD-10-CM | POA: Diagnosis not present

## 2022-05-27 DIAGNOSIS — R600 Localized edema: Secondary | ICD-10-CM | POA: Diagnosis not present

## 2022-05-27 DIAGNOSIS — E7800 Pure hypercholesterolemia, unspecified: Secondary | ICD-10-CM | POA: Diagnosis not present

## 2022-05-27 DIAGNOSIS — Z743 Need for continuous supervision: Secondary | ICD-10-CM | POA: Diagnosis not present

## 2022-06-22 DIAGNOSIS — R5381 Other malaise: Secondary | ICD-10-CM | POA: Diagnosis not present

## 2022-06-22 DIAGNOSIS — R7989 Other specified abnormal findings of blood chemistry: Secondary | ICD-10-CM | POA: Diagnosis not present

## 2022-06-22 DIAGNOSIS — R7301 Impaired fasting glucose: Secondary | ICD-10-CM | POA: Diagnosis not present

## 2022-06-22 DIAGNOSIS — R531 Weakness: Secondary | ICD-10-CM | POA: Diagnosis not present

## 2022-06-22 DIAGNOSIS — E7800 Pure hypercholesterolemia, unspecified: Secondary | ICD-10-CM | POA: Diagnosis not present

## 2022-06-22 DIAGNOSIS — E871 Hypo-osmolality and hyponatremia: Secondary | ICD-10-CM | POA: Diagnosis not present

## 2022-06-22 DIAGNOSIS — R600 Localized edema: Secondary | ICD-10-CM | POA: Diagnosis not present

## 2022-06-22 DIAGNOSIS — N1832 Chronic kidney disease, stage 3b: Secondary | ICD-10-CM | POA: Diagnosis not present

## 2022-06-22 DIAGNOSIS — H6123 Impacted cerumen, bilateral: Secondary | ICD-10-CM | POA: Diagnosis not present

## 2022-06-22 DIAGNOSIS — I10 Essential (primary) hypertension: Secondary | ICD-10-CM | POA: Diagnosis not present

## 2022-07-06 DIAGNOSIS — S32059D Unspecified fracture of fifth lumbar vertebra, subsequent encounter for fracture with routine healing: Secondary | ICD-10-CM | POA: Diagnosis not present

## 2022-07-06 DIAGNOSIS — S32019D Unspecified fracture of first lumbar vertebra, subsequent encounter for fracture with routine healing: Secondary | ICD-10-CM | POA: Diagnosis not present

## 2022-07-06 DIAGNOSIS — S32029D Unspecified fracture of second lumbar vertebra, subsequent encounter for fracture with routine healing: Secondary | ICD-10-CM | POA: Diagnosis not present

## 2022-07-06 DIAGNOSIS — S22080D Wedge compression fracture of T11-T12 vertebra, subsequent encounter for fracture with routine healing: Secondary | ICD-10-CM | POA: Diagnosis not present

## 2022-07-20 DIAGNOSIS — I10 Essential (primary) hypertension: Secondary | ICD-10-CM | POA: Diagnosis not present

## 2022-07-20 DIAGNOSIS — E871 Hypo-osmolality and hyponatremia: Secondary | ICD-10-CM | POA: Diagnosis not present

## 2022-07-20 DIAGNOSIS — R600 Localized edema: Secondary | ICD-10-CM | POA: Diagnosis not present

## 2022-07-20 DIAGNOSIS — N1832 Chronic kidney disease, stage 3b: Secondary | ICD-10-CM | POA: Diagnosis not present

## 2022-07-20 DIAGNOSIS — E7800 Pure hypercholesterolemia, unspecified: Secondary | ICD-10-CM | POA: Diagnosis not present

## 2022-07-20 DIAGNOSIS — R5381 Other malaise: Secondary | ICD-10-CM | POA: Diagnosis not present

## 2022-07-20 DIAGNOSIS — R531 Weakness: Secondary | ICD-10-CM | POA: Diagnosis not present

## 2022-07-20 DIAGNOSIS — R7301 Impaired fasting glucose: Secondary | ICD-10-CM | POA: Diagnosis not present

## 2022-07-20 DIAGNOSIS — R7989 Other specified abnormal findings of blood chemistry: Secondary | ICD-10-CM | POA: Diagnosis not present

## 2022-08-14 DIAGNOSIS — R600 Localized edema: Secondary | ICD-10-CM | POA: Diagnosis not present

## 2022-08-14 DIAGNOSIS — R7989 Other specified abnormal findings of blood chemistry: Secondary | ICD-10-CM | POA: Diagnosis not present

## 2022-08-14 DIAGNOSIS — R531 Weakness: Secondary | ICD-10-CM | POA: Diagnosis not present

## 2022-08-14 DIAGNOSIS — R7301 Impaired fasting glucose: Secondary | ICD-10-CM | POA: Diagnosis not present

## 2022-08-14 DIAGNOSIS — E871 Hypo-osmolality and hyponatremia: Secondary | ICD-10-CM | POA: Diagnosis not present

## 2022-08-20 DIAGNOSIS — N1832 Chronic kidney disease, stage 3b: Secondary | ICD-10-CM | POA: Diagnosis not present

## 2022-08-20 DIAGNOSIS — R531 Weakness: Secondary | ICD-10-CM | POA: Diagnosis not present

## 2022-08-20 DIAGNOSIS — R7301 Impaired fasting glucose: Secondary | ICD-10-CM | POA: Diagnosis not present

## 2022-08-20 DIAGNOSIS — I10 Essential (primary) hypertension: Secondary | ICD-10-CM | POA: Diagnosis not present

## 2022-08-20 DIAGNOSIS — R7989 Other specified abnormal findings of blood chemistry: Secondary | ICD-10-CM | POA: Diagnosis not present

## 2022-08-20 DIAGNOSIS — R600 Localized edema: Secondary | ICD-10-CM | POA: Diagnosis not present

## 2022-08-20 DIAGNOSIS — E871 Hypo-osmolality and hyponatremia: Secondary | ICD-10-CM | POA: Diagnosis not present

## 2022-08-20 DIAGNOSIS — R5381 Other malaise: Secondary | ICD-10-CM | POA: Diagnosis not present

## 2022-08-20 DIAGNOSIS — E7800 Pure hypercholesterolemia, unspecified: Secondary | ICD-10-CM | POA: Diagnosis not present

## 2022-09-14 DIAGNOSIS — I129 Hypertensive chronic kidney disease with stage 1 through stage 4 chronic kidney disease, or unspecified chronic kidney disease: Secondary | ICD-10-CM | POA: Diagnosis not present

## 2022-09-14 DIAGNOSIS — S22080D Wedge compression fracture of T11-T12 vertebra, subsequent encounter for fracture with routine healing: Secondary | ICD-10-CM | POA: Diagnosis not present

## 2022-09-14 DIAGNOSIS — M4805 Spinal stenosis, thoracolumbar region: Secondary | ICD-10-CM | POA: Diagnosis not present

## 2022-09-14 DIAGNOSIS — N1832 Chronic kidney disease, stage 3b: Secondary | ICD-10-CM | POA: Diagnosis not present

## 2022-09-18 DIAGNOSIS — I129 Hypertensive chronic kidney disease with stage 1 through stage 4 chronic kidney disease, or unspecified chronic kidney disease: Secondary | ICD-10-CM | POA: Diagnosis not present

## 2022-09-18 DIAGNOSIS — N1832 Chronic kidney disease, stage 3b: Secondary | ICD-10-CM | POA: Diagnosis not present

## 2022-09-18 DIAGNOSIS — Z9181 History of falling: Secondary | ICD-10-CM | POA: Diagnosis not present

## 2022-10-12 DIAGNOSIS — R5381 Other malaise: Secondary | ICD-10-CM | POA: Diagnosis not present

## 2022-10-12 DIAGNOSIS — R451 Restlessness and agitation: Secondary | ICD-10-CM | POA: Diagnosis not present

## 2022-10-12 DIAGNOSIS — R413 Other amnesia: Secondary | ICD-10-CM | POA: Diagnosis not present

## 2022-10-15 ENCOUNTER — Telehealth: Payer: Self-pay | Admitting: *Deleted

## 2022-10-15 NOTE — Patient Outreach (Signed)
  Care Coordination   10/15/2022 Name: Alicia Schwartz MRN: [id number] DOB: 1931/01/22   Care Coordination Outreach Attempts:  An unsuccessful telephone outreach was attempted today to offer the patient information about available care coordination services as a benefit of their health plan.   Follow Up Plan:  Additional outreach attempts will be made to offer the patient care coordination information and services.   Encounter Outcome:  Pt. Request to Call Back   Care Coordination Interventions:  No, not indicated    Eduard Clos, MSW, [place] Worker [facility] [phone]

## 2022-12-09 DIAGNOSIS — I129 Hypertensive chronic kidney disease with stage 1 through stage 4 chronic kidney disease, or unspecified chronic kidney disease: Secondary | ICD-10-CM | POA: Diagnosis not present

## 2022-12-09 DIAGNOSIS — N1832 Chronic kidney disease, stage 3b: Secondary | ICD-10-CM | POA: Diagnosis not present

## 2022-12-21 DIAGNOSIS — I129 Hypertensive chronic kidney disease with stage 1 through stage 4 chronic kidney disease, or unspecified chronic kidney disease: Secondary | ICD-10-CM | POA: Diagnosis not present

## 2022-12-21 DIAGNOSIS — N1832 Chronic kidney disease, stage 3b: Secondary | ICD-10-CM | POA: Diagnosis not present

## 2022-12-25 ENCOUNTER — Ambulatory Visit: Payer: Self-pay

## 2022-12-25 NOTE — Patient Outreach (Signed)
  Care Coordination   Initial Visit Note   12/25/2022 Name: Camya Cerutti MRN: [id number] DOB: 1931-04-24  Monserrath Shontia Pies is a 89 y.o. year old female who sees Linus Galas, NP for primary care. I  received a voice message from patients son Rayvin Julio requesting assistance with scheduling transportation to upcomming primary care provider appointment. Contacted the patients son to determine the patient has an appointment Friday 6/7 at 9:30 am. Assisted the patient and her son with contacting [employer] to book transportation. SW also contacted the patients primary care providers office to request information be added to the patients appointment notes with return trip information.  What matters to the patients health and wellness today?  Transportation to appointment on Friday 6/7  Pick up details: Patient will be picked up from her home between 8:30- 9 and transported to [facility]  Return trip details: Patient requested to call [employer] when she is ready to return home. Patient is to call [phone] with trip ID # [id number]   SDOH assessments and interventions completed:  No     Care Coordination Interventions:  Yes, provided   Interventions Today    Flowsheet Row Most Recent Value  General Interventions   General Interventions Discussed/Reviewed Communication with, [employer] transportation to PCP appt via [employer] transportation benefit]  Communication with PCP/Specialists       Follow up plan: No further intervention required.   Encounter Outcome:  Pt. Visit Completed   Bevelyn Ngo, BSW, CDP Social Worker, Certified Dementia Practitioner [facility] Care Management  Care Coordination [phone]

## 2022-12-25 NOTE — Patient Instructions (Signed)
Visit Information  Thank you for taking time to visit with me today. Please don't hesitate to contact me if I can be of assistance to you.   Following are the goals we discussed today:  - Attend primary care providers appointment by accessing your health plan transportation benefit.   If you are experiencing a Mental Health or Behavioral Health Crisis or need someone to talk to, please go to [facility] [address], [place] [phone]) call [phone]  The patient verbalized understanding of instructions, educational materials, and care plan provided today and DECLINED offer to receive copy of patient instructions, educational materials, and care plan.   No further follow up required: Please contact your primary care providers office as needed  Bevelyn Ngo, Kenard Gower, CDP Social Worker, Certified Dementia Practitioner [facility] Care Management  Care Coordination [phone]

## 2022-12-28 DIAGNOSIS — Z9989 Dependence on other enabling machines and devices: Secondary | ICD-10-CM | POA: Diagnosis not present

## 2022-12-28 DIAGNOSIS — R6889 Other general symptoms and signs: Secondary | ICD-10-CM | POA: Diagnosis not present

## 2022-12-28 DIAGNOSIS — R451 Restlessness and agitation: Secondary | ICD-10-CM | POA: Diagnosis not present

## 2022-12-28 DIAGNOSIS — N1832 Chronic kidney disease, stage 3b: Secondary | ICD-10-CM | POA: Diagnosis not present

## 2022-12-28 DIAGNOSIS — R413 Other amnesia: Secondary | ICD-10-CM | POA: Diagnosis not present

## 2022-12-28 DIAGNOSIS — E7800 Pure hypercholesterolemia, unspecified: Secondary | ICD-10-CM | POA: Diagnosis not present

## 2022-12-28 DIAGNOSIS — R4589 Other symptoms and signs involving emotional state: Secondary | ICD-10-CM | POA: Diagnosis not present

## 2022-12-28 DIAGNOSIS — H6122 Impacted cerumen, left ear: Secondary | ICD-10-CM | POA: Diagnosis not present

## 2022-12-28 DIAGNOSIS — E039 Hypothyroidism, unspecified: Secondary | ICD-10-CM | POA: Diagnosis not present

## 2022-12-28 DIAGNOSIS — I10 Essential (primary) hypertension: Secondary | ICD-10-CM | POA: Diagnosis not present

## 2023-01-08 DIAGNOSIS — N1832 Chronic kidney disease, stage 3b: Secondary | ICD-10-CM | POA: Diagnosis not present

## 2023-01-08 DIAGNOSIS — I129 Hypertensive chronic kidney disease with stage 1 through stage 4 chronic kidney disease, or unspecified chronic kidney disease: Secondary | ICD-10-CM | POA: Diagnosis not present

## 2023-01-20 DIAGNOSIS — I129 Hypertensive chronic kidney disease with stage 1 through stage 4 chronic kidney disease, or unspecified chronic kidney disease: Secondary | ICD-10-CM | POA: Diagnosis not present

## 2023-01-20 DIAGNOSIS — N1832 Chronic kidney disease, stage 3b: Secondary | ICD-10-CM | POA: Diagnosis not present

## 2023-01-29 IMAGING — CR DG LUMBAR SPINE COMPLETE 4+V
6 series · 6 of 6 positions shown · non-contrast
Comparison: MRI from 07/06/2019

CLINICAL DATA: Low back pain

EXAM:
LUMBAR SPINE - COMPLETE 4+ VIEW

[t lumbar spine ap]
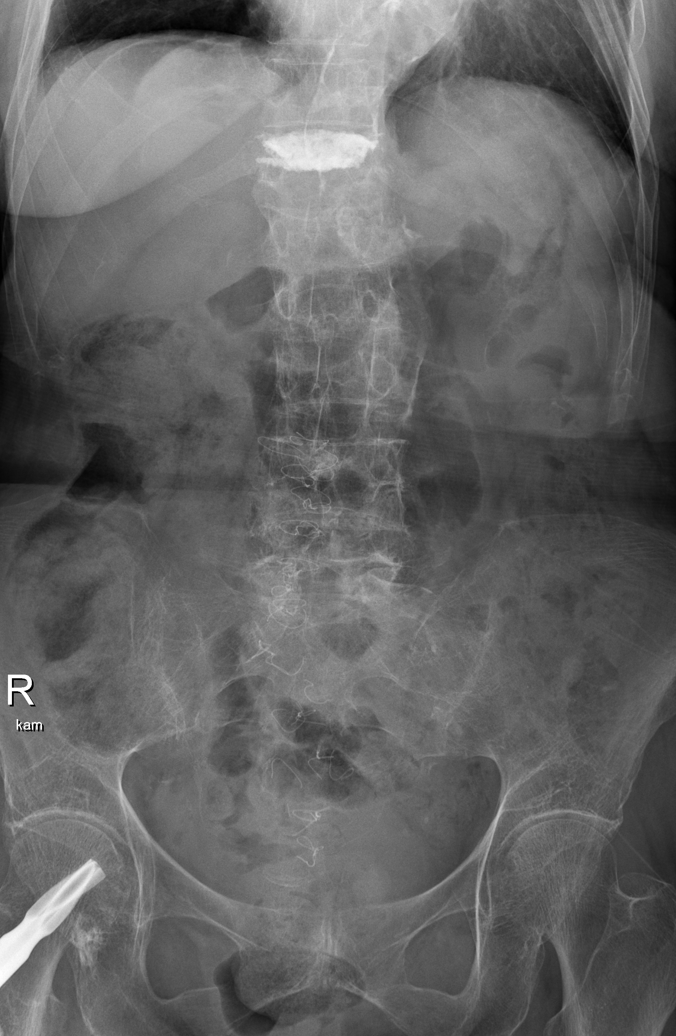

[t lumbar spine obl (1 of 2)]
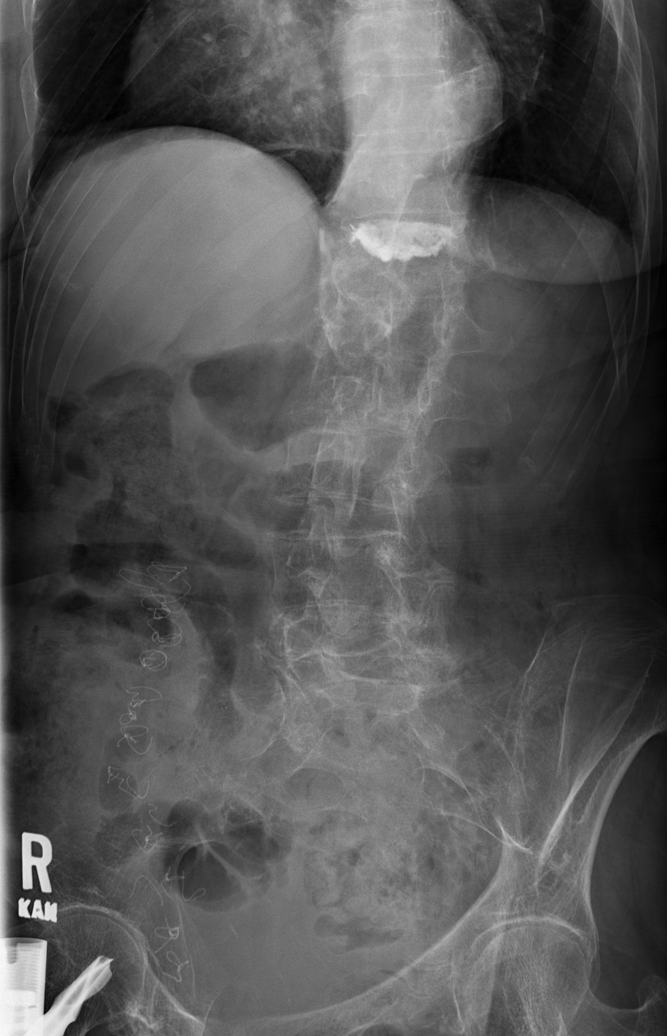

[t lumbar spine obl (2 of 2)]
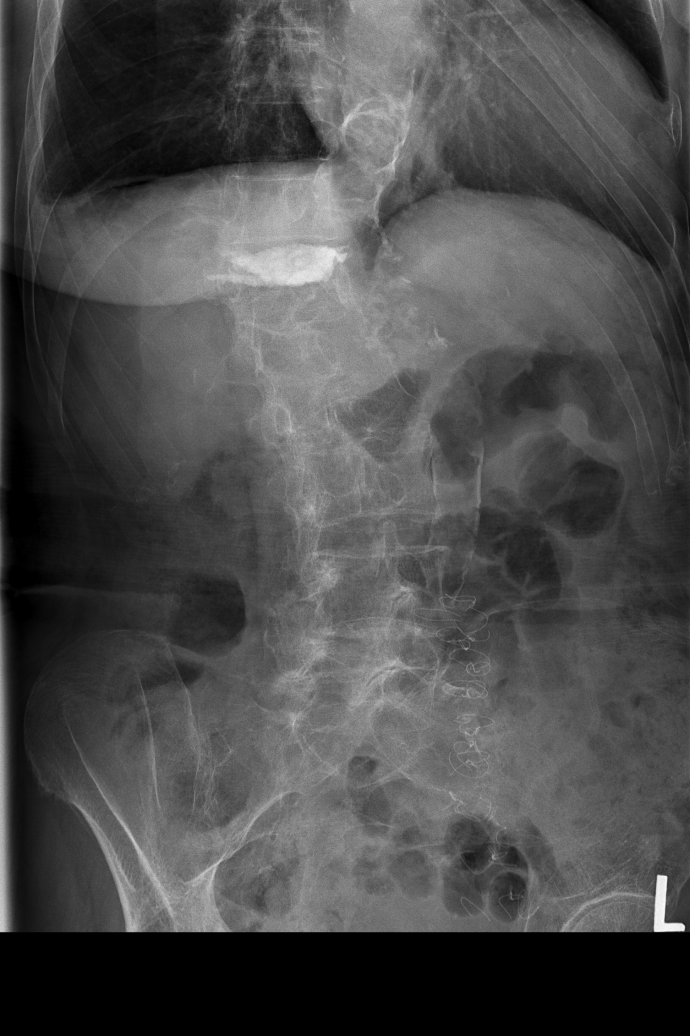

[t lumbar spine lat (1 of 2)]
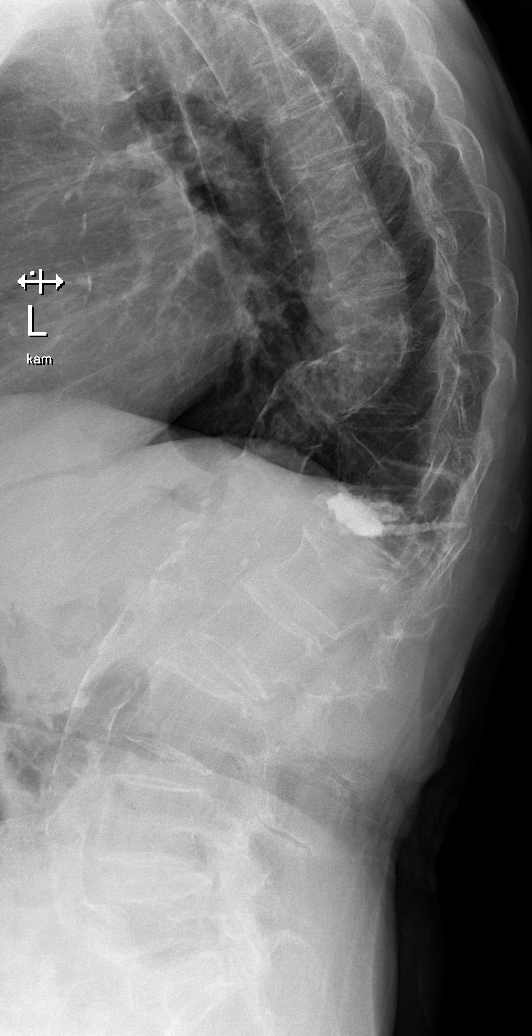

[t lumbar spine lat (2 of 2)]
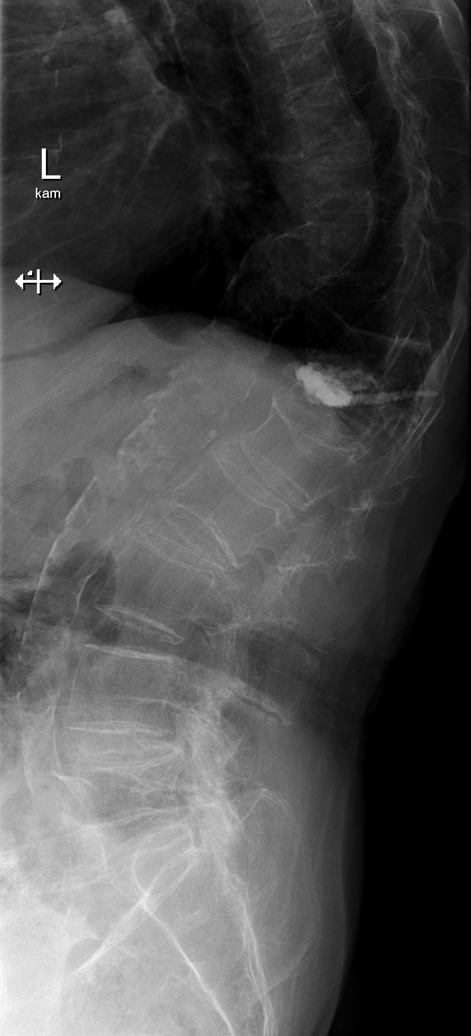

[t lumbar l-5 s-1 spot]
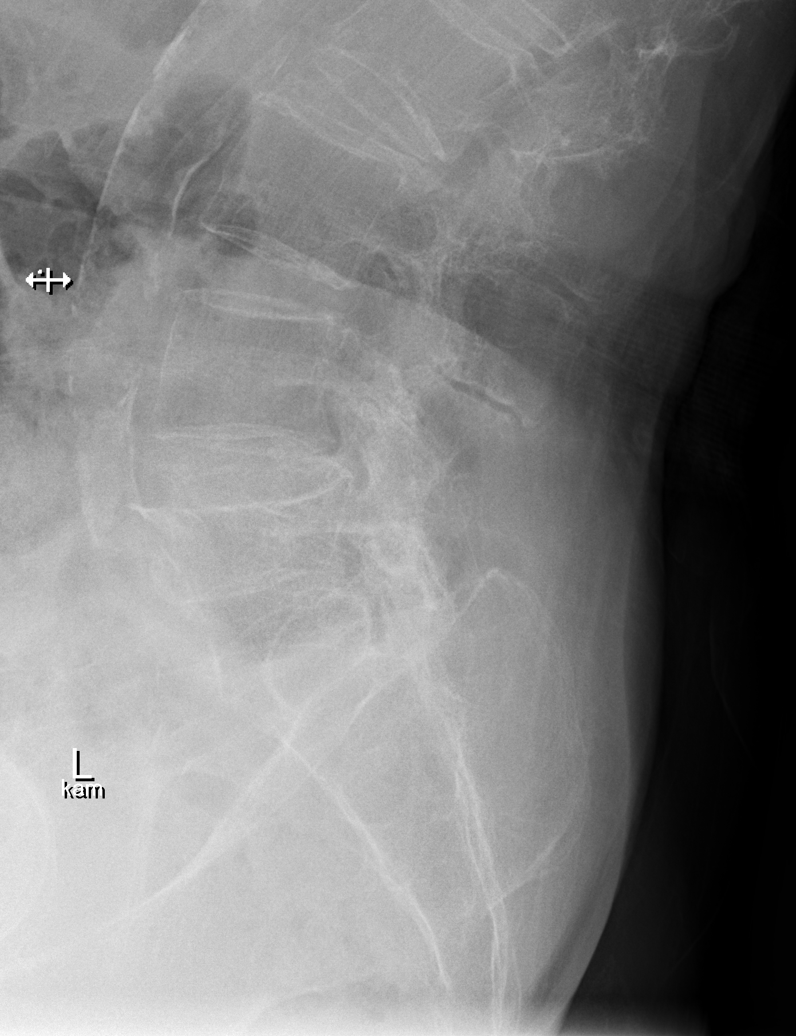

[6 of 6 positions shown; findings below may reference images not displayed]

FINDINGS: Changes of prior T12 vertebral augmentation are noted. L1
compression deformity is noted stable in appearance from the prior
exam. Mild endplate deformity at L2 is noted as well also stable
from the prior exam. Mild degenerative anterolisthesis of L4 on L5
is seen. Atherosclerotic calcifications are noted without aneurysmal
dilatation.
IMPRESSION: Changes of prior T12 augmentation.

Degenerative changes of the lumbar spine as described. No acute
abnormality noted.

## 2023-01-29 IMAGING — CR DG THORACIC SPINE 3V
3 series · 3 of 3 positions shown · non-contrast
Comparison: 06/29/2019

CLINICAL DATA: Chronic back pain slightly increased over the past
week, initial encounter

EXAM:
THORACIC SPINE - 3 VIEWS

[t thoracic spine ap]
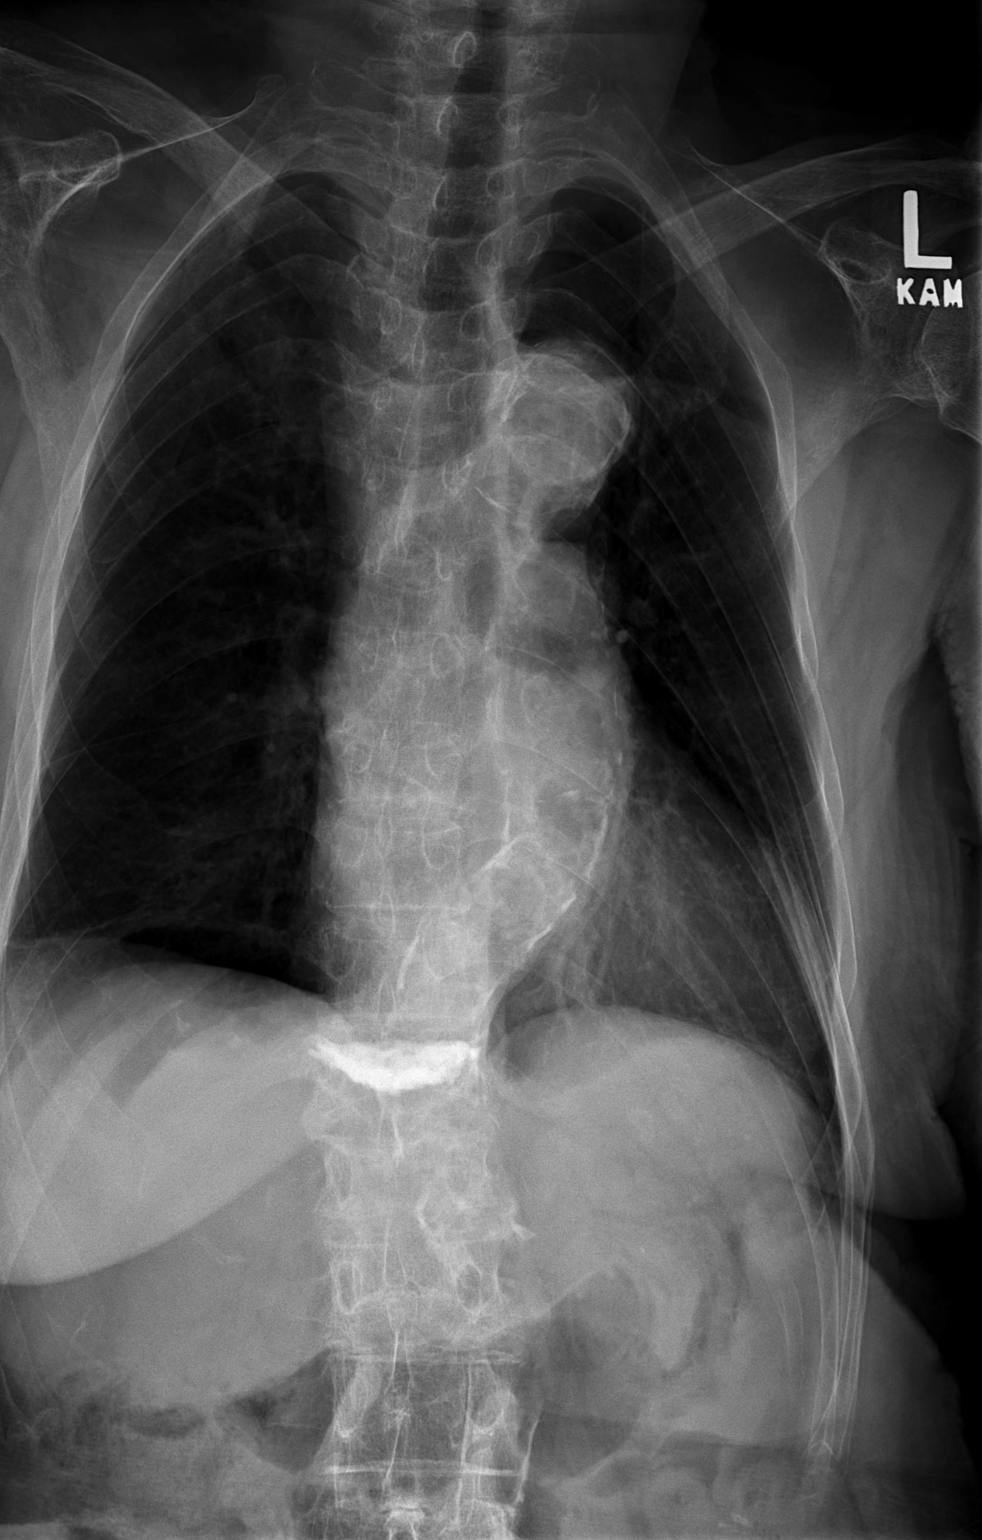

[t thoracic spine lat]
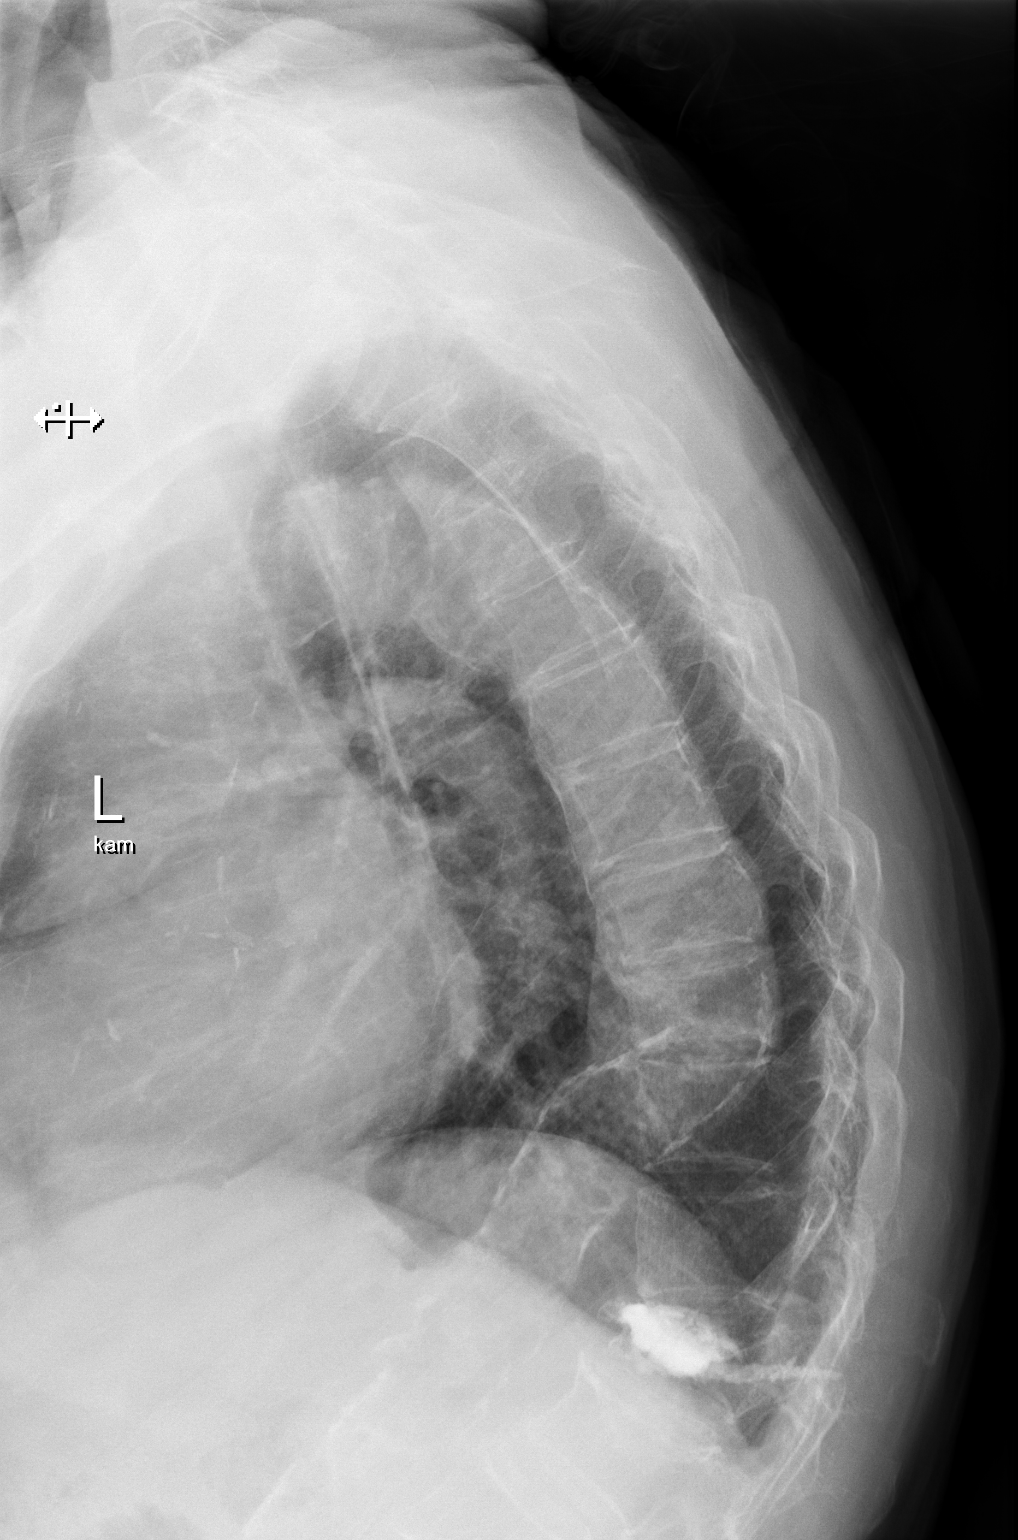

[t thoracic swimmers]
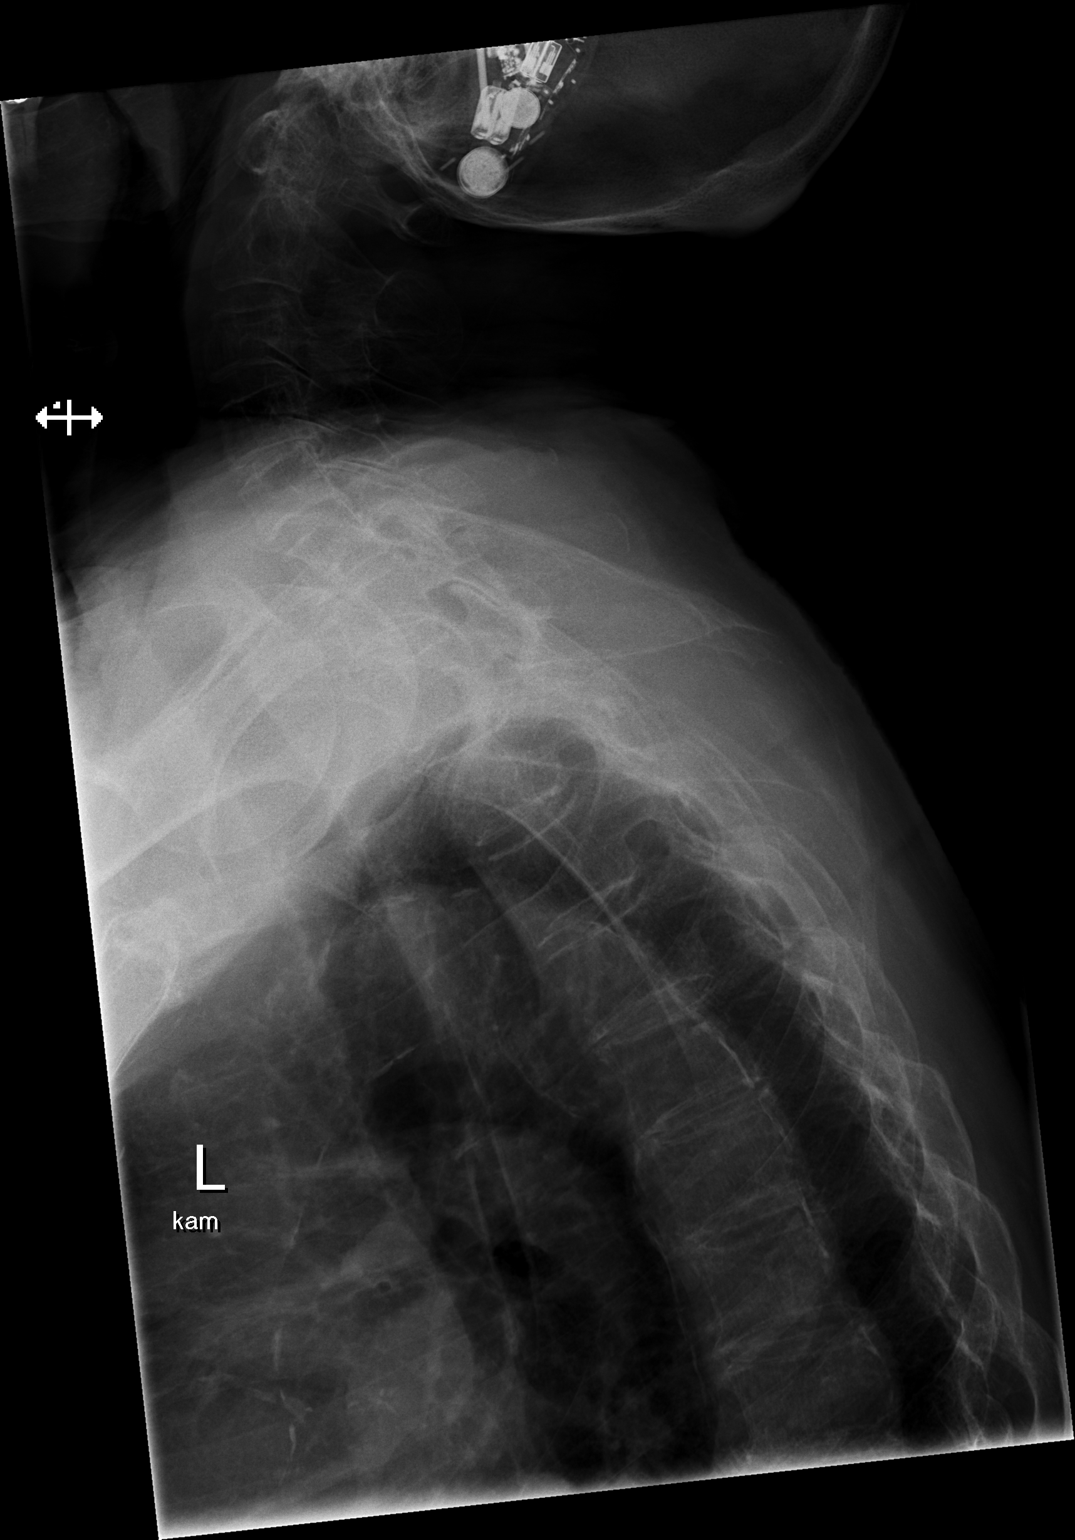

[3 of 3 positions shown; findings below may reference images not displayed]

FINDINGS: Changes of prior vertebral augmentation at T12 are noted. Mild
compression deformity at T9 is noted. This is new from the prior
plain film from 7676. Mild osteophytic changes are seen. No other
compression deformity is noted. No paraspinal mass is seen. Aortic
calcifications are noted.
IMPRESSION: Changes of vertebral augmentation at T12.

Mild T9 compression deformity is noted new from 7676. Correlate to
point tenderness.

## 2023-02-07 ENCOUNTER — Other Ambulatory Visit: Payer: Self-pay

## 2023-02-07 ENCOUNTER — Encounter (HOSPITAL_COMMUNITY): Payer: Self-pay | Admitting: Emergency Medicine

## 2023-02-07 ENCOUNTER — Emergency Department (HOSPITAL_COMMUNITY)
Admission: EM | Admit: 2023-02-07 | Discharge: 2023-02-07 | Disposition: A | Discharge status: Home / Self Care | Payer: Medicare HMO | Attending: Emergency Medicine | Admitting: Emergency Medicine

## 2023-02-07 DIAGNOSIS — R531 Weakness: Secondary | ICD-10-CM | POA: Diagnosis not present

## 2023-02-07 DIAGNOSIS — Z742 Need for assistance at home and no other household member able to render care: Secondary | ICD-10-CM

## 2023-02-07 DIAGNOSIS — Z79899 Other long term (current) drug therapy: Secondary | ICD-10-CM | POA: Diagnosis not present

## 2023-02-07 DIAGNOSIS — N399 Disorder of urinary system, unspecified: Secondary | ICD-10-CM | POA: Diagnosis not present

## 2023-02-07 DIAGNOSIS — R3915 Urgency of urination: Secondary | ICD-10-CM | POA: Diagnosis not present

## 2023-02-07 DIAGNOSIS — I129 Hypertensive chronic kidney disease with stage 1 through stage 4 chronic kidney disease, or unspecified chronic kidney disease: Secondary | ICD-10-CM | POA: Diagnosis not present

## 2023-02-07 DIAGNOSIS — I10 Essential (primary) hypertension: Secondary | ICD-10-CM | POA: Diagnosis not present

## 2023-02-07 DIAGNOSIS — Z743 Need for continuous supervision: Secondary | ICD-10-CM | POA: Diagnosis not present

## 2023-02-07 LAB — URINALYSIS, ROUTINE W REFLEX MICROSCOPIC
Bilirubin Urine: NEGATIVE
Glucose, UA: NEGATIVE mg/dL
Hgb urine dipstick: NEGATIVE
Ketones, ur: NEGATIVE mg/dL
Leukocytes,Ua: NEGATIVE
Nitrite: NEGATIVE
Protein, ur: NEGATIVE mg/dL
Specific Gravity, Urine: 1.004 — ABNORMAL LOW (ref 1.005–1.030)
pH: 7 (ref 5.0–8.0)

## 2023-02-07 LAB — COMPREHENSIVE METABOLIC PANEL
ALT: 17 U/L (ref 0–44)
AST: 21 U/L (ref 15–41)
Albumin: 3.7 g/dL (ref 3.5–5.0)
Alkaline Phosphatase: 93 U/L (ref 38–126)
Anion gap: 7 (ref 5–15)
BUN: 25 mg/dL — ABNORMAL HIGH (ref 8–23)
CO2: 26 mmol/L (ref 22–32)
Calcium: 9 mg/dL (ref 8.9–10.3)
Chloride: 100 mmol/L (ref 98–111)
Creatinine, Ser: 1.31 mg/dL — ABNORMAL HIGH (ref 0.44–1.00)
GFR, Estimated: 39 mL/min — ABNORMAL LOW (ref >60)
Glucose, Bld: 107 mg/dL — ABNORMAL HIGH (ref 70–99)
Potassium: 3.9 mmol/L (ref 3.5–5.1)
Sodium: 133 mmol/L — ABNORMAL LOW (ref 135–145)
Total Bilirubin: 0.7 mg/dL (ref 0.3–1.2)
Total Protein: 6.9 g/dL (ref 6.5–8.1)

## 2023-02-07 LAB — CBC WITH DIFFERENTIAL/PLATELET
Abs Immature Granulocytes: 0.04 10*3/uL (ref 0.00–0.07)
Basophils Absolute: 0 10*3/uL (ref 0.0–0.1)
Basophils Relative: 0 %
Eosinophils Absolute: 0.1 10*3/uL (ref 0.0–0.5)
Eosinophils Relative: 2 %
HCT: 35.4 % — ABNORMAL LOW (ref 36.0–46.0)
Hemoglobin: 11.9 g/dL — ABNORMAL LOW (ref 12.0–15.0)
Immature Granulocytes: 0 %
Lymphocytes Relative: 14 %
Lymphs Abs: 1.3 10*3/uL (ref 0.7–4.0)
MCH: 33.7 pg (ref 26.0–34.0)
MCHC: 33.6 g/dL (ref 30.0–36.0)
MCV: 100.3 fL — ABNORMAL HIGH (ref 80.0–100.0)
Monocytes Absolute: 0.6 10*3/uL (ref 0.1–1.0)
Monocytes Relative: 7 %
Neutro Abs: 7 10*3/uL (ref 1.7–7.7)
Neutrophils Relative %: 77 %
Platelets: 228 10*3/uL (ref 150–400)
RBC: 3.53 MIL/uL — ABNORMAL LOW (ref 3.87–5.11)
RDW: 13 % (ref 11.5–15.5)
WBC: 9.1 10*3/uL (ref 4.0–10.5)
nRBC: 0 % (ref 0.0–0.2)

## 2023-02-07 NOTE — ED Triage Notes (Signed)
Patient presents home for the original complaint of decreased urinary output, but main concern is a desire to set up home health. The son is also interested in nursing home placement. EMS reports patient is A&O x4.    EMS vitals: 144/98 BP 75 HR 97 % SPO2 on room air 117 CBG 97.1 temp

## 2023-02-07 NOTE — Care Management (Addendum)
Transition of Care [facility]) - Emergency Department Mini Assessment   Patient Details  Name: Alicia Schwartz MRN: [id number] Date of Birth: [birth year]  Transition of Care [facility]) CM/SW Contact:    Lavenia Atlas, RN Phone Number: 02/07/2023, 4:03 PM   Clinical Narrative: Patient presented to [facility] ED for decreased urine output. Received TOC consult for home health/ DME services. This RNCM spoke with patient at bedside who reports she is in her "right mind" and wants to go home. Patient reports she has a the following DME at home PTA: walker, wheelchair, bed pan. Patient reports she receives Meals on Wheels however has only had coffee today so her son brought her to ED. Patient gave this RN CM permission to call her son, however feels he will tell this RNCM something different.  This RN CM spoke with patient's son [name] [phone] who reports his mother told him last night that she couldn't urinate resulting in him sending her to the hospital today. This RNCM explained the difference in [facility] services and private duty as her insurance will not cover private duty, pt will have to pay oop. [name] reports patient had [facility] previously. This RNCM offered choice and family chose [facility] or any HH agency that can services her. Awaiting EDP disposition, HH orders (PT/OT, HHA, SW).   TOC will continue to follow for needs.    Transportation at discharge: [facility]     - 4:50pm Spoke with Tresa Endo with [facility] who will follow patient for HHPT/OT, HHA, SW, info added to AVS.  -4:55pm This RNCM spoke with patient's son [name] to advise patient will be discharged today and [facility] will be contacting him within 24-48 hours from discharge. Per chart review patient's son inquired to triage RN re: LTC placement. This RNCM explained the ED does not normally place for LTC care as patient will need long term care insurance. This RNCM offered [facility] referral, patient's son agreed to referral and this RN CM  encouraged patient's son to follow up with patient's PCP who can also assist with LTC placement.Referral made to [facility] w/ [facility].  Patient's son reports he will be home when patient arrives home.  [facility] has been called.   No additional TOC needs at this time.    ED Mini Assessment: What brought you to the Emergency Department? : decreased urine output  Barriers to Discharge: Continued Medical Work up  [employer] interventions: coordinating home health services     Interventions which prevented an admission or readmission: Home Health Consult or Services    Patient Designer, industrial/product        ,            CMS Medicare.gov Compare Post Acute Care list provided to:: Patient Choice offered to / list presented to : Patient  Admission diagnosis:  Placement Patient Active Problem List   Diagnosis Date Noted   Elevated TSH 02/26/2022   Normocytic anemia 02/24/2022   Hypokalemia    Physical deconditioning    AKI (acute kidney injury) (HCC) 02/23/2022   Lower extremity edema 01/26/2022   Closed T12 fracture (HCC) 07/04/2019   Hypertensive urgency 07/04/2019   Back pain 07/04/2019   Pressure injury of skin 07/04/2019   Postoperative anemia due to acute blood loss 02/14/2015   Paroxysmal supraventricular tachycardia (HCC) 02/12/2015   Fracture, subtrochanteric, right femur, closed (HCC) 02/11/2015   Fall due to stumbling 02/11/2015   Anxiety state 09/19/2006   HTN (hypertension) 09/19/2006  PCP:  Linus Galas, NP Pharmacy:   [facility] [address], [place] - [address] [place] [address] Phone: [phone] Fax: [phone]

## 2023-02-07 NOTE — Discharge Instructions (Signed)
We evaluated you for your urinary symptoms.  Your urine test today was normal and your kidney function was also normal.  Please be sure to take your normal medications and stay hydrated.  Please return to the emergency department if you have any new or worsening symptoms such as fevers or chills, chest pain, abdominal pain, headaches, confusion, nausea or vomiting, fevers, weakness, or any other new symptoms.

## 2023-02-07 NOTE — ED Provider Notes (Signed)
[place] EMERGENCY DEPARTMENT AT [facility] Provider Note  CSN: [id number] Arrival date & time: 02/07/23 1338  Chief Complaint(s) No chief complaint on file.  HPI Alicia Schwartz is a 89 y.o. female history of chronic kidney disease, hypertension presenting to the emergency department with urination issue.  Patient's son sent her to the emergency department because she had complained of not urinating as much.  He had first called the primary doctor who advised to come to the emergency department.  Patient's son would also like help with home health or nursing placement given he is having difficulty being primary caregiver for the patient over the past few months to year.  He reports that she has had a gradual decline, over the past half year has been more forgetful and weak.  Also refusing to take medications at times.  To me the patient denies any specific medical complaints.  She denies any pain anywhere including no headaches, chest pain, abdominal pain.  She denies any specific urinary symptoms currently.  No other symptoms of fevers, chills or any other problems.  Past Medical History Past Medical History:  Diagnosis Date   Fracture, subtrochanteric, right femur, closed (HCC) 02/11/2015   Hypertension    Paroxysmal supraventricular tachycardia 02/12/2015   Patient Active Problem List   Diagnosis Date Noted   Elevated TSH 02/26/2022   Normocytic anemia 02/24/2022   Hypokalemia    Physical deconditioning    AKI (acute kidney injury) (HCC) 02/23/2022   Lower extremity edema 01/26/2022   Closed T12 fracture (HCC) 07/04/2019   Hypertensive urgency 07/04/2019   Back pain 07/04/2019   Pressure injury of skin 07/04/2019   Postoperative anemia due to acute blood loss 02/14/2015   Paroxysmal supraventricular tachycardia (HCC) 02/12/2015   Fracture, subtrochanteric, right femur, closed (HCC) 02/11/2015   Fall due to stumbling 02/11/2015   Anxiety state 09/19/2006   HTN  (hypertension) 09/19/2006   Home Medication(s) Prior to Admission medications   Medication Sig Start Date End Date Taking? Authorizing Provider  acetaminophen (TYLENOL) 325 MG tablet Take 2 tablets (650 mg total) by mouth every 6 (six) hours as needed for mild pain (or Fever >/= 101). 07/12/19   Calvert Cantor, MD  ferrous sulfate 325 (65 FE) MG tablet Take 1 tablet (325 mg total) by mouth daily with breakfast. 02/27/22   Lewie Chamber, MD  folic acid (FOLVITE) 1 MG tablet Take 1 mg by mouth daily.    [provider]  losartan (COZAAR) 25 MG tablet Take 3 tablets (75 mg total) by mouth daily. 02/27/22   Lewie Chamber, MD  metoprolol succinate (TOPROL-XL) 25 MG 24 hr tablet Take 1 tablet (25 mg total) by mouth daily. 02/27/22   Lewie Chamber, MD  Multiple Vitamin (MULTIVITAMIN) tablet Take 1 tablet by mouth daily.    [provider]  sertraline (ZOLOFT) 25 MG tablet Take 25 mg by mouth daily.    [provider]  Past Surgical History Past Surgical History:  Procedure Laterality Date   ABDOMINAL HYSTERECTOMY     INTRAMEDULLARY (IM) NAIL INTERTROCHANTERIC Right 02/12/2015   Procedure: INTRAMEDULLARY (IM) NAIL INTERTROCHANTRIC;  Surgeon: Teryl Lucy, MD;  Location: [facility] OR;  Service: Orthopedics;  Laterality: Right;   IR VERTEBROPLASTY CERV/THOR BX INC UNI/BIL INC/INJECT/IMAGING  07/08/2019   Family History Family History  Problem Relation Age of Onset   Alzheimer's disease Mother    Prostate cancer Father     Social History Social History   Tobacco Use   Smoking status: Never   Smokeless tobacco: Never  Substance Use Topics   Alcohol use: No   Allergies Patient has no known allergies.  Review of Systems Review of Systems  All other systems reviewed and are negative.   Physical Exam Vital Signs  I have reviewed the triage  vital signs BP 136/71   Pulse 71   Temp (!) 97.5 F (36.4 C) (Oral)   Resp 18   SpO2 97%  Physical Exam Vitals and nursing note reviewed.  Constitutional:      General: She is not in acute distress.    Appearance: She is well-developed.  HENT:     Head: Normocephalic and atraumatic.     Mouth/Throat:     Mouth: Mucous membranes are moist.  Eyes:     Pupils: Pupils are equal, round, and reactive to light.  Cardiovascular:     Rate and Rhythm: Normal rate and regular rhythm.     Heart sounds: No murmur heard. Pulmonary:     Effort: Pulmonary effort is normal. No respiratory distress.     Breath sounds: Normal breath sounds.  Abdominal:     General: Abdomen is flat.     Palpations: Abdomen is soft.     Tenderness: There is no abdominal tenderness. There is no right CVA tenderness or left CVA tenderness.  Musculoskeletal:        General: No tenderness.     Right lower leg: No edema.     Left lower leg: No edema.  Skin:    General: Skin is warm and dry.  Neurological:     Mental Status: She is alert.     Comments: Cranial nerves II through XII intact, strength 5 out of 5 in the bilateral upper and lower extremities.  Patient oriented to place, self, not to year  Psychiatric:        Mood and Affect: Mood normal.        Behavior: Behavior normal.     ED Results and Treatments Labs (all labs ordered are listed, but only abnormal results are displayed) Labs Reviewed  COMPREHENSIVE METABOLIC PANEL - Abnormal; Notable for the following components:      Result Value   Sodium 133 (*)    Glucose, Bld 107 (*)    BUN 25 (*)    Creatinine, Ser 1.31 (*)    GFR, Estimated 39 (*)    All other components within normal limits  CBC WITH DIFFERENTIAL/PLATELET - Abnormal; Notable for the following components:   RBC 3.53 (*)    Hemoglobin 11.9 (*)    HCT 35.4 (*)    MCV 100.3 (*)    All other components within normal limits  URINALYSIS, ROUTINE W REFLEX MICROSCOPIC - Abnormal;  Notable for the following components:   APPearance HAZY (*)    Specific Gravity, Urine 1.004 (*)    All other components within normal limits  Radiology No results found.  Pertinent labs & imaging results that were available during my care of the patient were reviewed by me and considered in my medical decision making (see MDM for details).  Medications Ordered in ED Medications - No data to display                                                                                                                                   Procedures Procedures  (including critical care time)  Medical Decision Making / ED Course   MDM:  89 year old female presenting to the emergency department with abnormal urinary symptoms.  Patient well-appearing, physical exam is unremarkable.  No abdominal tenderness, CVA tenderness.  No focal neurologic deficit.  Patient oriented x 2.  Discussed with patient's son who reports that she has been more confused gradually over the past few months.  Given her age, some concern she could be developing some dementia.  Has no formal diagnosis.  Patient's urinalysis here is unremarkable.  Her labs are at baseline including kidney function.  She has been urinating normally in the emergency department.  Given son requesting help at home, discussed with social worker who will help arrange home health for physical therapy, occupational therapy, home health nursing.  Patient denies any specific complaints here in the emergency department and her vitals are reassuring.  Will discharge patient to home. All questions answered. Patient and son comfortable with plan of discharge. Return precautions discussed with patient and son and specified on the after visit summary.       Additional history obtained: -Additional history obtained from family and  ems -External records from outside source obtained and reviewed including: Chart review including previous notes, labs, imaging, consultation notes including prior PMD visits   Lab Tests: -I ordered, reviewed, and interpreted labs.   The pertinent results include:   Labs Reviewed  COMPREHENSIVE METABOLIC PANEL - Abnormal; Notable for the following components:      Result Value   Sodium 133 (*)    Glucose, Bld 107 (*)    BUN 25 (*)    Creatinine, Ser 1.31 (*)    GFR, Estimated 39 (*)    All other components within normal limits  CBC WITH DIFFERENTIAL/PLATELET - Abnormal; Notable for the following components:   RBC 3.53 (*)    Hemoglobin 11.9 (*)    HCT 35.4 (*)    MCV 100.3 (*)    All other components within normal limits  URINALYSIS, ROUTINE W REFLEX MICROSCOPIC - Abnormal; Notable for the following components:   APPearance HAZY (*)    Specific Gravity, Urine 1.004 (*)    All other components within normal limits    Notable for normal UA, cr at baseline  Medicines ordered and prescription drug management: No orders of the defined types were placed in this encounter.   -I have reviewed the patients home medicines and have made adjustments as needed  Co morbidities that complicate the patient evaluation  Past Medical History:  Diagnosis Date   Fracture, subtrochanteric, right femur, closed (HCC) 02/11/2015   Hypertension    Paroxysmal supraventricular tachycardia 02/12/2015      Dispostion: Disposition decision including need for hospitalization was considered, and patient discharged from emergency department.    Final Clinical Impression(s) / ED Diagnoses Final diagnoses:  Need for home health care     This chart was dictated using voice recognition software.  Despite best efforts to proofread,  errors can occur which can change the documentation meaning.    Lonell Grandchild, MD 02/07/23 [phone]

## 2023-02-12 DIAGNOSIS — Z8781 Personal history of (healed) traumatic fracture: Secondary | ICD-10-CM | POA: Diagnosis not present

## 2023-02-12 DIAGNOSIS — D631 Anemia in chronic kidney disease: Secondary | ICD-10-CM | POA: Diagnosis not present

## 2023-02-12 DIAGNOSIS — H9193 Unspecified hearing loss, bilateral: Secondary | ICD-10-CM | POA: Diagnosis not present

## 2023-02-12 DIAGNOSIS — N189 Chronic kidney disease, unspecified: Secondary | ICD-10-CM | POA: Diagnosis not present

## 2023-02-12 DIAGNOSIS — Z556 Problems related to health literacy: Secondary | ICD-10-CM | POA: Diagnosis not present

## 2023-02-12 DIAGNOSIS — K5900 Constipation, unspecified: Secondary | ICD-10-CM | POA: Diagnosis not present

## 2023-02-12 DIAGNOSIS — Z79891 Long term (current) use of opiate analgesic: Secondary | ICD-10-CM | POA: Diagnosis not present

## 2023-02-12 DIAGNOSIS — F411 Generalized anxiety disorder: Secondary | ICD-10-CM | POA: Diagnosis not present

## 2023-02-12 DIAGNOSIS — I129 Hypertensive chronic kidney disease with stage 1 through stage 4 chronic kidney disease, or unspecified chronic kidney disease: Secondary | ICD-10-CM | POA: Diagnosis not present

## 2023-02-13 DIAGNOSIS — I129 Hypertensive chronic kidney disease with stage 1 through stage 4 chronic kidney disease, or unspecified chronic kidney disease: Secondary | ICD-10-CM | POA: Diagnosis not present

## 2023-02-13 DIAGNOSIS — E039 Hypothyroidism, unspecified: Secondary | ICD-10-CM | POA: Diagnosis not present

## 2023-02-13 DIAGNOSIS — R451 Restlessness and agitation: Secondary | ICD-10-CM | POA: Diagnosis not present

## 2023-02-13 DIAGNOSIS — N1832 Chronic kidney disease, stage 3b: Secondary | ICD-10-CM | POA: Diagnosis not present

## 2023-02-13 DIAGNOSIS — H6122 Impacted cerumen, left ear: Secondary | ICD-10-CM | POA: Diagnosis not present

## 2023-02-13 DIAGNOSIS — F32A Depression, unspecified: Secondary | ICD-10-CM | POA: Diagnosis not present

## 2023-02-13 DIAGNOSIS — D631 Anemia in chronic kidney disease: Secondary | ICD-10-CM | POA: Diagnosis not present

## 2023-02-13 DIAGNOSIS — R413 Other amnesia: Secondary | ICD-10-CM | POA: Diagnosis not present

## 2023-02-13 DIAGNOSIS — E7800 Pure hypercholesterolemia, unspecified: Secondary | ICD-10-CM | POA: Diagnosis not present

## 2023-02-18 DIAGNOSIS — E039 Hypothyroidism, unspecified: Secondary | ICD-10-CM | POA: Diagnosis not present

## 2023-02-18 DIAGNOSIS — F32A Depression, unspecified: Secondary | ICD-10-CM | POA: Diagnosis not present

## 2023-02-18 DIAGNOSIS — N1832 Chronic kidney disease, stage 3b: Secondary | ICD-10-CM | POA: Diagnosis not present

## 2023-02-18 DIAGNOSIS — R451 Restlessness and agitation: Secondary | ICD-10-CM | POA: Diagnosis not present

## 2023-02-18 DIAGNOSIS — R413 Other amnesia: Secondary | ICD-10-CM | POA: Diagnosis not present

## 2023-02-18 DIAGNOSIS — I129 Hypertensive chronic kidney disease with stage 1 through stage 4 chronic kidney disease, or unspecified chronic kidney disease: Secondary | ICD-10-CM | POA: Diagnosis not present

## 2023-02-18 DIAGNOSIS — H6122 Impacted cerumen, left ear: Secondary | ICD-10-CM | POA: Diagnosis not present

## 2023-02-18 DIAGNOSIS — D631 Anemia in chronic kidney disease: Secondary | ICD-10-CM | POA: Diagnosis not present

## 2023-02-18 DIAGNOSIS — E7800 Pure hypercholesterolemia, unspecified: Secondary | ICD-10-CM | POA: Diagnosis not present

## 2023-02-19 DIAGNOSIS — E7800 Pure hypercholesterolemia, unspecified: Secondary | ICD-10-CM | POA: Diagnosis not present

## 2023-02-19 DIAGNOSIS — Z8781 Personal history of (healed) traumatic fracture: Secondary | ICD-10-CM | POA: Diagnosis not present

## 2023-02-19 DIAGNOSIS — N189 Chronic kidney disease, unspecified: Secondary | ICD-10-CM | POA: Diagnosis not present

## 2023-02-19 DIAGNOSIS — Z79891 Long term (current) use of opiate analgesic: Secondary | ICD-10-CM | POA: Diagnosis not present

## 2023-02-19 DIAGNOSIS — F32A Depression, unspecified: Secondary | ICD-10-CM | POA: Diagnosis not present

## 2023-02-19 DIAGNOSIS — K5900 Constipation, unspecified: Secondary | ICD-10-CM | POA: Diagnosis not present

## 2023-02-19 DIAGNOSIS — F411 Generalized anxiety disorder: Secondary | ICD-10-CM | POA: Diagnosis not present

## 2023-02-19 DIAGNOSIS — I129 Hypertensive chronic kidney disease with stage 1 through stage 4 chronic kidney disease, or unspecified chronic kidney disease: Secondary | ICD-10-CM | POA: Diagnosis not present

## 2023-02-19 DIAGNOSIS — R413 Other amnesia: Secondary | ICD-10-CM | POA: Diagnosis not present

## 2023-02-19 DIAGNOSIS — D631 Anemia in chronic kidney disease: Secondary | ICD-10-CM | POA: Diagnosis not present

## 2023-02-19 DIAGNOSIS — H6122 Impacted cerumen, left ear: Secondary | ICD-10-CM | POA: Diagnosis not present

## 2023-02-19 DIAGNOSIS — N1832 Chronic kidney disease, stage 3b: Secondary | ICD-10-CM | POA: Diagnosis not present

## 2023-02-19 DIAGNOSIS — E039 Hypothyroidism, unspecified: Secondary | ICD-10-CM | POA: Diagnosis not present

## 2023-02-19 DIAGNOSIS — Z556 Problems related to health literacy: Secondary | ICD-10-CM | POA: Diagnosis not present

## 2023-02-19 DIAGNOSIS — R451 Restlessness and agitation: Secondary | ICD-10-CM | POA: Diagnosis not present

## 2023-02-19 DIAGNOSIS — H9193 Unspecified hearing loss, bilateral: Secondary | ICD-10-CM | POA: Diagnosis not present

## 2023-02-20 DIAGNOSIS — K5900 Constipation, unspecified: Secondary | ICD-10-CM | POA: Diagnosis not present

## 2023-02-20 DIAGNOSIS — Z8781 Personal history of (healed) traumatic fracture: Secondary | ICD-10-CM | POA: Diagnosis not present

## 2023-02-20 DIAGNOSIS — Z79891 Long term (current) use of opiate analgesic: Secondary | ICD-10-CM | POA: Diagnosis not present

## 2023-02-20 DIAGNOSIS — H9193 Unspecified hearing loss, bilateral: Secondary | ICD-10-CM | POA: Diagnosis not present

## 2023-02-20 DIAGNOSIS — N189 Chronic kidney disease, unspecified: Secondary | ICD-10-CM | POA: Diagnosis not present

## 2023-02-20 DIAGNOSIS — D631 Anemia in chronic kidney disease: Secondary | ICD-10-CM | POA: Diagnosis not present

## 2023-02-20 DIAGNOSIS — Z556 Problems related to health literacy: Secondary | ICD-10-CM | POA: Diagnosis not present

## 2023-02-20 DIAGNOSIS — F411 Generalized anxiety disorder: Secondary | ICD-10-CM | POA: Diagnosis not present

## 2023-02-20 DIAGNOSIS — I129 Hypertensive chronic kidney disease with stage 1 through stage 4 chronic kidney disease, or unspecified chronic kidney disease: Secondary | ICD-10-CM | POA: Diagnosis not present

## 2023-02-22 DIAGNOSIS — Z8781 Personal history of (healed) traumatic fracture: Secondary | ICD-10-CM | POA: Diagnosis not present

## 2023-02-22 DIAGNOSIS — K5900 Constipation, unspecified: Secondary | ICD-10-CM | POA: Diagnosis not present

## 2023-02-22 DIAGNOSIS — D631 Anemia in chronic kidney disease: Secondary | ICD-10-CM | POA: Diagnosis not present

## 2023-02-22 DIAGNOSIS — Z79891 Long term (current) use of opiate analgesic: Secondary | ICD-10-CM | POA: Diagnosis not present

## 2023-02-22 DIAGNOSIS — I129 Hypertensive chronic kidney disease with stage 1 through stage 4 chronic kidney disease, or unspecified chronic kidney disease: Secondary | ICD-10-CM | POA: Diagnosis not present

## 2023-02-22 DIAGNOSIS — Z556 Problems related to health literacy: Secondary | ICD-10-CM | POA: Diagnosis not present

## 2023-02-22 DIAGNOSIS — N189 Chronic kidney disease, unspecified: Secondary | ICD-10-CM | POA: Diagnosis not present

## 2023-02-22 DIAGNOSIS — F411 Generalized anxiety disorder: Secondary | ICD-10-CM | POA: Diagnosis not present

## 2023-02-22 DIAGNOSIS — H9193 Unspecified hearing loss, bilateral: Secondary | ICD-10-CM | POA: Diagnosis not present

## 2023-02-25 DIAGNOSIS — Z8781 Personal history of (healed) traumatic fracture: Secondary | ICD-10-CM | POA: Diagnosis not present

## 2023-02-25 DIAGNOSIS — N1832 Chronic kidney disease, stage 3b: Secondary | ICD-10-CM | POA: Diagnosis not present

## 2023-02-25 DIAGNOSIS — Z79891 Long term (current) use of opiate analgesic: Secondary | ICD-10-CM | POA: Diagnosis not present

## 2023-02-25 DIAGNOSIS — N189 Chronic kidney disease, unspecified: Secondary | ICD-10-CM | POA: Diagnosis not present

## 2023-02-25 DIAGNOSIS — F411 Generalized anxiety disorder: Secondary | ICD-10-CM | POA: Diagnosis not present

## 2023-02-25 DIAGNOSIS — H9193 Unspecified hearing loss, bilateral: Secondary | ICD-10-CM | POA: Diagnosis not present

## 2023-02-25 DIAGNOSIS — K5900 Constipation, unspecified: Secondary | ICD-10-CM | POA: Diagnosis not present

## 2023-02-25 DIAGNOSIS — I129 Hypertensive chronic kidney disease with stage 1 through stage 4 chronic kidney disease, or unspecified chronic kidney disease: Secondary | ICD-10-CM | POA: Diagnosis not present

## 2023-02-25 DIAGNOSIS — D631 Anemia in chronic kidney disease: Secondary | ICD-10-CM | POA: Diagnosis not present

## 2023-02-25 DIAGNOSIS — E7800 Pure hypercholesterolemia, unspecified: Secondary | ICD-10-CM | POA: Diagnosis not present

## 2023-02-25 DIAGNOSIS — Z556 Problems related to health literacy: Secondary | ICD-10-CM | POA: Diagnosis not present

## 2023-02-26 DIAGNOSIS — H9193 Unspecified hearing loss, bilateral: Secondary | ICD-10-CM | POA: Diagnosis not present

## 2023-02-26 DIAGNOSIS — N189 Chronic kidney disease, unspecified: Secondary | ICD-10-CM | POA: Diagnosis not present

## 2023-02-26 DIAGNOSIS — Z8781 Personal history of (healed) traumatic fracture: Secondary | ICD-10-CM | POA: Diagnosis not present

## 2023-02-26 DIAGNOSIS — K5900 Constipation, unspecified: Secondary | ICD-10-CM | POA: Diagnosis not present

## 2023-02-26 DIAGNOSIS — Z556 Problems related to health literacy: Secondary | ICD-10-CM | POA: Diagnosis not present

## 2023-02-26 DIAGNOSIS — D631 Anemia in chronic kidney disease: Secondary | ICD-10-CM | POA: Diagnosis not present

## 2023-02-26 DIAGNOSIS — Z79891 Long term (current) use of opiate analgesic: Secondary | ICD-10-CM | POA: Diagnosis not present

## 2023-02-26 DIAGNOSIS — I129 Hypertensive chronic kidney disease with stage 1 through stage 4 chronic kidney disease, or unspecified chronic kidney disease: Secondary | ICD-10-CM | POA: Diagnosis not present

## 2023-02-26 DIAGNOSIS — F411 Generalized anxiety disorder: Secondary | ICD-10-CM | POA: Diagnosis not present

## 2023-02-27 DIAGNOSIS — F411 Generalized anxiety disorder: Secondary | ICD-10-CM | POA: Diagnosis not present

## 2023-02-27 DIAGNOSIS — I129 Hypertensive chronic kidney disease with stage 1 through stage 4 chronic kidney disease, or unspecified chronic kidney disease: Secondary | ICD-10-CM | POA: Diagnosis not present

## 2023-02-27 DIAGNOSIS — Z79891 Long term (current) use of opiate analgesic: Secondary | ICD-10-CM | POA: Diagnosis not present

## 2023-02-27 DIAGNOSIS — H9193 Unspecified hearing loss, bilateral: Secondary | ICD-10-CM | POA: Diagnosis not present

## 2023-02-27 DIAGNOSIS — N189 Chronic kidney disease, unspecified: Secondary | ICD-10-CM | POA: Diagnosis not present

## 2023-02-27 DIAGNOSIS — D631 Anemia in chronic kidney disease: Secondary | ICD-10-CM | POA: Diagnosis not present

## 2023-02-27 DIAGNOSIS — K5900 Constipation, unspecified: Secondary | ICD-10-CM | POA: Diagnosis not present

## 2023-02-27 DIAGNOSIS — Z8781 Personal history of (healed) traumatic fracture: Secondary | ICD-10-CM | POA: Diagnosis not present

## 2023-02-27 DIAGNOSIS — Z556 Problems related to health literacy: Secondary | ICD-10-CM | POA: Diagnosis not present

## 2023-03-01 DIAGNOSIS — I129 Hypertensive chronic kidney disease with stage 1 through stage 4 chronic kidney disease, or unspecified chronic kidney disease: Secondary | ICD-10-CM | POA: Diagnosis not present

## 2023-03-01 DIAGNOSIS — Z8781 Personal history of (healed) traumatic fracture: Secondary | ICD-10-CM | POA: Diagnosis not present

## 2023-03-01 DIAGNOSIS — N189 Chronic kidney disease, unspecified: Secondary | ICD-10-CM | POA: Diagnosis not present

## 2023-03-01 DIAGNOSIS — D631 Anemia in chronic kidney disease: Secondary | ICD-10-CM | POA: Diagnosis not present

## 2023-03-01 DIAGNOSIS — Z79891 Long term (current) use of opiate analgesic: Secondary | ICD-10-CM | POA: Diagnosis not present

## 2023-03-01 DIAGNOSIS — K5900 Constipation, unspecified: Secondary | ICD-10-CM | POA: Diagnosis not present

## 2023-03-01 DIAGNOSIS — H9193 Unspecified hearing loss, bilateral: Secondary | ICD-10-CM | POA: Diagnosis not present

## 2023-03-01 DIAGNOSIS — Z556 Problems related to health literacy: Secondary | ICD-10-CM | POA: Diagnosis not present

## 2023-03-01 DIAGNOSIS — F411 Generalized anxiety disorder: Secondary | ICD-10-CM | POA: Diagnosis not present

## 2023-03-02 DIAGNOSIS — I129 Hypertensive chronic kidney disease with stage 1 through stage 4 chronic kidney disease, or unspecified chronic kidney disease: Secondary | ICD-10-CM | POA: Diagnosis not present

## 2023-03-02 DIAGNOSIS — Z79891 Long term (current) use of opiate analgesic: Secondary | ICD-10-CM | POA: Diagnosis not present

## 2023-03-02 DIAGNOSIS — H9193 Unspecified hearing loss, bilateral: Secondary | ICD-10-CM | POA: Diagnosis not present

## 2023-03-02 DIAGNOSIS — F411 Generalized anxiety disorder: Secondary | ICD-10-CM | POA: Diagnosis not present

## 2023-03-02 DIAGNOSIS — N189 Chronic kidney disease, unspecified: Secondary | ICD-10-CM | POA: Diagnosis not present

## 2023-03-02 DIAGNOSIS — Z8781 Personal history of (healed) traumatic fracture: Secondary | ICD-10-CM | POA: Diagnosis not present

## 2023-03-02 DIAGNOSIS — D631 Anemia in chronic kidney disease: Secondary | ICD-10-CM | POA: Diagnosis not present

## 2023-03-02 DIAGNOSIS — K5900 Constipation, unspecified: Secondary | ICD-10-CM | POA: Diagnosis not present

## 2023-03-02 DIAGNOSIS — Z556 Problems related to health literacy: Secondary | ICD-10-CM | POA: Diagnosis not present

## 2023-03-05 DIAGNOSIS — I10 Essential (primary) hypertension: Secondary | ICD-10-CM | POA: Diagnosis not present

## 2023-03-05 DIAGNOSIS — R6889 Other general symptoms and signs: Secondary | ICD-10-CM | POA: Diagnosis not present

## 2023-03-05 DIAGNOSIS — H9193 Unspecified hearing loss, bilateral: Secondary | ICD-10-CM | POA: Diagnosis not present

## 2023-03-05 DIAGNOSIS — R413 Other amnesia: Secondary | ICD-10-CM | POA: Diagnosis not present

## 2023-03-05 DIAGNOSIS — J3489 Other specified disorders of nose and nasal sinuses: Secondary | ICD-10-CM | POA: Diagnosis not present

## 2023-03-06 DIAGNOSIS — D631 Anemia in chronic kidney disease: Secondary | ICD-10-CM | POA: Diagnosis not present

## 2023-03-06 DIAGNOSIS — I129 Hypertensive chronic kidney disease with stage 1 through stage 4 chronic kidney disease, or unspecified chronic kidney disease: Secondary | ICD-10-CM | POA: Diagnosis not present

## 2023-03-06 DIAGNOSIS — Z8781 Personal history of (healed) traumatic fracture: Secondary | ICD-10-CM | POA: Diagnosis not present

## 2023-03-06 DIAGNOSIS — Z79891 Long term (current) use of opiate analgesic: Secondary | ICD-10-CM | POA: Diagnosis not present

## 2023-03-06 DIAGNOSIS — H9193 Unspecified hearing loss, bilateral: Secondary | ICD-10-CM | POA: Diagnosis not present

## 2023-03-06 DIAGNOSIS — F411 Generalized anxiety disorder: Secondary | ICD-10-CM | POA: Diagnosis not present

## 2023-03-06 DIAGNOSIS — K5900 Constipation, unspecified: Secondary | ICD-10-CM | POA: Diagnosis not present

## 2023-03-06 DIAGNOSIS — Z556 Problems related to health literacy: Secondary | ICD-10-CM | POA: Diagnosis not present

## 2023-03-06 DIAGNOSIS — N189 Chronic kidney disease, unspecified: Secondary | ICD-10-CM | POA: Diagnosis not present

## 2023-03-14 DIAGNOSIS — N189 Chronic kidney disease, unspecified: Secondary | ICD-10-CM | POA: Diagnosis not present

## 2023-03-14 DIAGNOSIS — F411 Generalized anxiety disorder: Secondary | ICD-10-CM | POA: Diagnosis not present

## 2023-03-14 DIAGNOSIS — Z556 Problems related to health literacy: Secondary | ICD-10-CM | POA: Diagnosis not present

## 2023-03-14 DIAGNOSIS — H9193 Unspecified hearing loss, bilateral: Secondary | ICD-10-CM | POA: Diagnosis not present

## 2023-03-14 DIAGNOSIS — K5900 Constipation, unspecified: Secondary | ICD-10-CM | POA: Diagnosis not present

## 2023-03-14 DIAGNOSIS — Z8781 Personal history of (healed) traumatic fracture: Secondary | ICD-10-CM | POA: Diagnosis not present

## 2023-03-14 DIAGNOSIS — D631 Anemia in chronic kidney disease: Secondary | ICD-10-CM | POA: Diagnosis not present

## 2023-03-14 DIAGNOSIS — Z79891 Long term (current) use of opiate analgesic: Secondary | ICD-10-CM | POA: Diagnosis not present

## 2023-03-14 DIAGNOSIS — I129 Hypertensive chronic kidney disease with stage 1 through stage 4 chronic kidney disease, or unspecified chronic kidney disease: Secondary | ICD-10-CM | POA: Diagnosis not present

## 2023-03-15 DIAGNOSIS — I129 Hypertensive chronic kidney disease with stage 1 through stage 4 chronic kidney disease, or unspecified chronic kidney disease: Secondary | ICD-10-CM | POA: Diagnosis not present

## 2023-03-15 DIAGNOSIS — Z556 Problems related to health literacy: Secondary | ICD-10-CM | POA: Diagnosis not present

## 2023-03-15 DIAGNOSIS — N189 Chronic kidney disease, unspecified: Secondary | ICD-10-CM | POA: Diagnosis not present

## 2023-03-15 DIAGNOSIS — D631 Anemia in chronic kidney disease: Secondary | ICD-10-CM | POA: Diagnosis not present

## 2023-03-15 DIAGNOSIS — K5900 Constipation, unspecified: Secondary | ICD-10-CM | POA: Diagnosis not present

## 2023-03-15 DIAGNOSIS — H9193 Unspecified hearing loss, bilateral: Secondary | ICD-10-CM | POA: Diagnosis not present

## 2023-03-15 DIAGNOSIS — F411 Generalized anxiety disorder: Secondary | ICD-10-CM | POA: Diagnosis not present

## 2023-03-15 DIAGNOSIS — Z79891 Long term (current) use of opiate analgesic: Secondary | ICD-10-CM | POA: Diagnosis not present

## 2023-03-15 DIAGNOSIS — Z8781 Personal history of (healed) traumatic fracture: Secondary | ICD-10-CM | POA: Diagnosis not present

## 2023-03-18 DIAGNOSIS — I129 Hypertensive chronic kidney disease with stage 1 through stage 4 chronic kidney disease, or unspecified chronic kidney disease: Secondary | ICD-10-CM | POA: Diagnosis not present

## 2023-03-18 DIAGNOSIS — Z556 Problems related to health literacy: Secondary | ICD-10-CM | POA: Diagnosis not present

## 2023-03-18 DIAGNOSIS — F411 Generalized anxiety disorder: Secondary | ICD-10-CM | POA: Diagnosis not present

## 2023-03-18 DIAGNOSIS — D631 Anemia in chronic kidney disease: Secondary | ICD-10-CM | POA: Diagnosis not present

## 2023-03-18 DIAGNOSIS — Z79891 Long term (current) use of opiate analgesic: Secondary | ICD-10-CM | POA: Diagnosis not present

## 2023-03-18 DIAGNOSIS — K5900 Constipation, unspecified: Secondary | ICD-10-CM | POA: Diagnosis not present

## 2023-03-18 DIAGNOSIS — N189 Chronic kidney disease, unspecified: Secondary | ICD-10-CM | POA: Diagnosis not present

## 2023-03-18 DIAGNOSIS — H9193 Unspecified hearing loss, bilateral: Secondary | ICD-10-CM | POA: Diagnosis not present

## 2023-03-18 DIAGNOSIS — Z8781 Personal history of (healed) traumatic fracture: Secondary | ICD-10-CM | POA: Diagnosis not present

## 2023-03-19 DIAGNOSIS — N189 Chronic kidney disease, unspecified: Secondary | ICD-10-CM | POA: Diagnosis not present

## 2023-03-19 DIAGNOSIS — K5900 Constipation, unspecified: Secondary | ICD-10-CM | POA: Diagnosis not present

## 2023-03-19 DIAGNOSIS — I129 Hypertensive chronic kidney disease with stage 1 through stage 4 chronic kidney disease, or unspecified chronic kidney disease: Secondary | ICD-10-CM | POA: Diagnosis not present

## 2023-03-19 DIAGNOSIS — F411 Generalized anxiety disorder: Secondary | ICD-10-CM | POA: Diagnosis not present

## 2023-03-19 DIAGNOSIS — Z556 Problems related to health literacy: Secondary | ICD-10-CM | POA: Diagnosis not present

## 2023-03-19 DIAGNOSIS — Z8781 Personal history of (healed) traumatic fracture: Secondary | ICD-10-CM | POA: Diagnosis not present

## 2023-03-19 DIAGNOSIS — H9193 Unspecified hearing loss, bilateral: Secondary | ICD-10-CM | POA: Diagnosis not present

## 2023-03-19 DIAGNOSIS — D631 Anemia in chronic kidney disease: Secondary | ICD-10-CM | POA: Diagnosis not present

## 2023-03-19 DIAGNOSIS — Z79891 Long term (current) use of opiate analgesic: Secondary | ICD-10-CM | POA: Diagnosis not present

## 2023-03-20 DIAGNOSIS — Z8781 Personal history of (healed) traumatic fracture: Secondary | ICD-10-CM | POA: Diagnosis not present

## 2023-03-20 DIAGNOSIS — N189 Chronic kidney disease, unspecified: Secondary | ICD-10-CM | POA: Diagnosis not present

## 2023-03-20 DIAGNOSIS — K5900 Constipation, unspecified: Secondary | ICD-10-CM | POA: Diagnosis not present

## 2023-03-20 DIAGNOSIS — Z556 Problems related to health literacy: Secondary | ICD-10-CM | POA: Diagnosis not present

## 2023-03-20 DIAGNOSIS — H9193 Unspecified hearing loss, bilateral: Secondary | ICD-10-CM | POA: Diagnosis not present

## 2023-03-20 DIAGNOSIS — D631 Anemia in chronic kidney disease: Secondary | ICD-10-CM | POA: Diagnosis not present

## 2023-03-20 DIAGNOSIS — I129 Hypertensive chronic kidney disease with stage 1 through stage 4 chronic kidney disease, or unspecified chronic kidney disease: Secondary | ICD-10-CM | POA: Diagnosis not present

## 2023-03-20 DIAGNOSIS — Z79891 Long term (current) use of opiate analgesic: Secondary | ICD-10-CM | POA: Diagnosis not present

## 2023-03-20 DIAGNOSIS — F411 Generalized anxiety disorder: Secondary | ICD-10-CM | POA: Diagnosis not present

## 2023-03-26 DIAGNOSIS — Z556 Problems related to health literacy: Secondary | ICD-10-CM | POA: Diagnosis not present

## 2023-03-26 DIAGNOSIS — K5900 Constipation, unspecified: Secondary | ICD-10-CM | POA: Diagnosis not present

## 2023-03-26 DIAGNOSIS — Z79891 Long term (current) use of opiate analgesic: Secondary | ICD-10-CM | POA: Diagnosis not present

## 2023-03-26 DIAGNOSIS — Z8781 Personal history of (healed) traumatic fracture: Secondary | ICD-10-CM | POA: Diagnosis not present

## 2023-03-26 DIAGNOSIS — H9193 Unspecified hearing loss, bilateral: Secondary | ICD-10-CM | POA: Diagnosis not present

## 2023-03-26 DIAGNOSIS — N189 Chronic kidney disease, unspecified: Secondary | ICD-10-CM | POA: Diagnosis not present

## 2023-03-26 DIAGNOSIS — D631 Anemia in chronic kidney disease: Secondary | ICD-10-CM | POA: Diagnosis not present

## 2023-03-26 DIAGNOSIS — F411 Generalized anxiety disorder: Secondary | ICD-10-CM | POA: Diagnosis not present

## 2023-03-26 DIAGNOSIS — I129 Hypertensive chronic kidney disease with stage 1 through stage 4 chronic kidney disease, or unspecified chronic kidney disease: Secondary | ICD-10-CM | POA: Diagnosis not present

## 2023-03-27 DIAGNOSIS — H9193 Unspecified hearing loss, bilateral: Secondary | ICD-10-CM | POA: Diagnosis not present

## 2023-03-27 DIAGNOSIS — Z556 Problems related to health literacy: Secondary | ICD-10-CM | POA: Diagnosis not present

## 2023-03-27 DIAGNOSIS — N189 Chronic kidney disease, unspecified: Secondary | ICD-10-CM | POA: Diagnosis not present

## 2023-03-27 DIAGNOSIS — Z79891 Long term (current) use of opiate analgesic: Secondary | ICD-10-CM | POA: Diagnosis not present

## 2023-03-27 DIAGNOSIS — I129 Hypertensive chronic kidney disease with stage 1 through stage 4 chronic kidney disease, or unspecified chronic kidney disease: Secondary | ICD-10-CM | POA: Diagnosis not present

## 2023-03-27 DIAGNOSIS — K5900 Constipation, unspecified: Secondary | ICD-10-CM | POA: Diagnosis not present

## 2023-03-27 DIAGNOSIS — D631 Anemia in chronic kidney disease: Secondary | ICD-10-CM | POA: Diagnosis not present

## 2023-03-27 DIAGNOSIS — F411 Generalized anxiety disorder: Secondary | ICD-10-CM | POA: Diagnosis not present

## 2023-03-27 DIAGNOSIS — Z8781 Personal history of (healed) traumatic fracture: Secondary | ICD-10-CM | POA: Diagnosis not present

## 2023-03-28 DIAGNOSIS — N189 Chronic kidney disease, unspecified: Secondary | ICD-10-CM | POA: Diagnosis not present

## 2023-03-28 DIAGNOSIS — D631 Anemia in chronic kidney disease: Secondary | ICD-10-CM | POA: Diagnosis not present

## 2023-03-28 DIAGNOSIS — F411 Generalized anxiety disorder: Secondary | ICD-10-CM | POA: Diagnosis not present

## 2023-03-28 DIAGNOSIS — I129 Hypertensive chronic kidney disease with stage 1 through stage 4 chronic kidney disease, or unspecified chronic kidney disease: Secondary | ICD-10-CM | POA: Diagnosis not present

## 2023-03-29 DIAGNOSIS — K5900 Constipation, unspecified: Secondary | ICD-10-CM | POA: Diagnosis not present

## 2023-03-29 DIAGNOSIS — Z79891 Long term (current) use of opiate analgesic: Secondary | ICD-10-CM | POA: Diagnosis not present

## 2023-03-29 DIAGNOSIS — N189 Chronic kidney disease, unspecified: Secondary | ICD-10-CM | POA: Diagnosis not present

## 2023-03-29 DIAGNOSIS — Z556 Problems related to health literacy: Secondary | ICD-10-CM | POA: Diagnosis not present

## 2023-03-29 DIAGNOSIS — Z8781 Personal history of (healed) traumatic fracture: Secondary | ICD-10-CM | POA: Diagnosis not present

## 2023-03-29 DIAGNOSIS — D631 Anemia in chronic kidney disease: Secondary | ICD-10-CM | POA: Diagnosis not present

## 2023-03-29 DIAGNOSIS — F411 Generalized anxiety disorder: Secondary | ICD-10-CM | POA: Diagnosis not present

## 2023-03-29 DIAGNOSIS — I129 Hypertensive chronic kidney disease with stage 1 through stage 4 chronic kidney disease, or unspecified chronic kidney disease: Secondary | ICD-10-CM | POA: Diagnosis not present

## 2023-03-29 DIAGNOSIS — H9193 Unspecified hearing loss, bilateral: Secondary | ICD-10-CM | POA: Diagnosis not present

## 2023-04-01 DIAGNOSIS — H9193 Unspecified hearing loss, bilateral: Secondary | ICD-10-CM | POA: Diagnosis not present

## 2023-04-01 DIAGNOSIS — Z79891 Long term (current) use of opiate analgesic: Secondary | ICD-10-CM | POA: Diagnosis not present

## 2023-04-01 DIAGNOSIS — Z8781 Personal history of (healed) traumatic fracture: Secondary | ICD-10-CM | POA: Diagnosis not present

## 2023-04-01 DIAGNOSIS — F411 Generalized anxiety disorder: Secondary | ICD-10-CM | POA: Diagnosis not present

## 2023-04-01 DIAGNOSIS — I129 Hypertensive chronic kidney disease with stage 1 through stage 4 chronic kidney disease, or unspecified chronic kidney disease: Secondary | ICD-10-CM | POA: Diagnosis not present

## 2023-04-01 DIAGNOSIS — N189 Chronic kidney disease, unspecified: Secondary | ICD-10-CM | POA: Diagnosis not present

## 2023-04-01 DIAGNOSIS — K5900 Constipation, unspecified: Secondary | ICD-10-CM | POA: Diagnosis not present

## 2023-04-01 DIAGNOSIS — D631 Anemia in chronic kidney disease: Secondary | ICD-10-CM | POA: Diagnosis not present

## 2023-04-01 DIAGNOSIS — Z556 Problems related to health literacy: Secondary | ICD-10-CM | POA: Diagnosis not present

## 2023-05-16 DIAGNOSIS — I129 Hypertensive chronic kidney disease with stage 1 through stage 4 chronic kidney disease, or unspecified chronic kidney disease: Secondary | ICD-10-CM | POA: Diagnosis not present

## 2023-05-16 DIAGNOSIS — F419 Anxiety disorder, unspecified: Secondary | ICD-10-CM | POA: Diagnosis not present

## 2023-05-16 DIAGNOSIS — N1832 Chronic kidney disease, stage 3b: Secondary | ICD-10-CM | POA: Diagnosis not present

## 2023-05-16 DIAGNOSIS — Z9181 History of falling: Secondary | ICD-10-CM | POA: Diagnosis not present

## 2023-05-16 DIAGNOSIS — R5381 Other malaise: Secondary | ICD-10-CM | POA: Diagnosis not present

## 2023-05-17 ENCOUNTER — Other Ambulatory Visit: Payer: Self-pay

## 2023-05-17 ENCOUNTER — Encounter (HOSPITAL_COMMUNITY): Payer: Self-pay

## 2023-05-17 ENCOUNTER — Emergency Department (HOSPITAL_COMMUNITY): Payer: Medicare HMO

## 2023-05-17 ENCOUNTER — Inpatient Hospital Stay (HOSPITAL_COMMUNITY)
Admission: EM | Admit: 2023-05-17 | Discharge: 2023-05-27 | DRG: 871 | Disposition: A | Discharge status: Skilled Nursing Facility | Payer: Medicare HMO | Attending: Internal Medicine | Admitting: Internal Medicine

## 2023-05-17 DIAGNOSIS — A419 Sepsis, unspecified organism: Secondary | ICD-10-CM | POA: Diagnosis not present

## 2023-05-17 DIAGNOSIS — I471 Supraventricular tachycardia, unspecified: Secondary | ICD-10-CM | POA: Diagnosis present

## 2023-05-17 DIAGNOSIS — J13 Pneumonia due to Streptococcus pneumoniae: Secondary | ICD-10-CM | POA: Diagnosis present

## 2023-05-17 DIAGNOSIS — R278 Other lack of coordination: Secondary | ICD-10-CM | POA: Diagnosis not present

## 2023-05-17 DIAGNOSIS — E46 Unspecified protein-calorie malnutrition: Secondary | ICD-10-CM | POA: Diagnosis not present

## 2023-05-17 DIAGNOSIS — I480 Paroxysmal atrial fibrillation: Secondary | ICD-10-CM | POA: Diagnosis present

## 2023-05-17 DIAGNOSIS — I129 Hypertensive chronic kidney disease with stage 1 through stage 4 chronic kidney disease, or unspecified chronic kidney disease: Secondary | ICD-10-CM | POA: Diagnosis present

## 2023-05-17 DIAGNOSIS — J188 Other pneumonia, unspecified organism: Secondary | ICD-10-CM | POA: Diagnosis not present

## 2023-05-17 DIAGNOSIS — H9190 Unspecified hearing loss, unspecified ear: Secondary | ICD-10-CM | POA: Diagnosis present

## 2023-05-17 DIAGNOSIS — R262 Difficulty in walking, not elsewhere classified: Secondary | ICD-10-CM | POA: Diagnosis present

## 2023-05-17 DIAGNOSIS — A409 Streptococcal sepsis, unspecified: Secondary | ICD-10-CM | POA: Diagnosis not present

## 2023-05-17 DIAGNOSIS — D631 Anemia in chronic kidney disease: Secondary | ICD-10-CM | POA: Diagnosis present

## 2023-05-17 DIAGNOSIS — R059 Cough, unspecified: Secondary | ICD-10-CM | POA: Diagnosis not present

## 2023-05-17 DIAGNOSIS — G8929 Other chronic pain: Secondary | ICD-10-CM | POA: Diagnosis present

## 2023-05-17 DIAGNOSIS — R5381 Other malaise: Secondary | ICD-10-CM | POA: Diagnosis present

## 2023-05-17 DIAGNOSIS — M545 Low back pain, unspecified: Secondary | ICD-10-CM | POA: Diagnosis present

## 2023-05-17 DIAGNOSIS — E876 Hypokalemia: Secondary | ICD-10-CM | POA: Diagnosis present

## 2023-05-17 DIAGNOSIS — J90 Pleural effusion, not elsewhere classified: Secondary | ICD-10-CM | POA: Diagnosis not present

## 2023-05-17 DIAGNOSIS — Z90710 Acquired absence of both cervix and uterus: Secondary | ICD-10-CM

## 2023-05-17 DIAGNOSIS — I700 Atherosclerosis of aorta: Secondary | ICD-10-CM | POA: Diagnosis not present

## 2023-05-17 DIAGNOSIS — J189 Pneumonia, unspecified organism: Principal | ICD-10-CM | POA: Insufficient documentation

## 2023-05-17 DIAGNOSIS — Z48813 Encounter for surgical aftercare following surgery on the respiratory system: Secondary | ICD-10-CM | POA: Diagnosis not present

## 2023-05-17 DIAGNOSIS — Z1152 Encounter for screening for COVID-19: Secondary | ICD-10-CM

## 2023-05-17 DIAGNOSIS — Z66 Do not resuscitate: Secondary | ICD-10-CM | POA: Diagnosis present

## 2023-05-17 DIAGNOSIS — I4891 Unspecified atrial fibrillation: Secondary | ICD-10-CM

## 2023-05-17 DIAGNOSIS — J9819 Other pulmonary collapse: Secondary | ICD-10-CM | POA: Diagnosis present

## 2023-05-17 DIAGNOSIS — R531 Weakness: Secondary | ICD-10-CM | POA: Diagnosis present

## 2023-05-17 DIAGNOSIS — Z79899 Other long term (current) drug therapy: Secondary | ICD-10-CM | POA: Diagnosis not present

## 2023-05-17 DIAGNOSIS — D509 Iron deficiency anemia, unspecified: Secondary | ICD-10-CM | POA: Diagnosis not present

## 2023-05-17 DIAGNOSIS — J9601 Acute respiratory failure with hypoxia: Secondary | ICD-10-CM | POA: Diagnosis present

## 2023-05-17 DIAGNOSIS — J918 Pleural effusion in other conditions classified elsewhere: Secondary | ICD-10-CM | POA: Diagnosis present

## 2023-05-17 DIAGNOSIS — R6520 Severe sepsis without septic shock: Secondary | ICD-10-CM | POA: Diagnosis present

## 2023-05-17 DIAGNOSIS — N179 Acute kidney failure, unspecified: Secondary | ICD-10-CM | POA: Diagnosis present

## 2023-05-17 DIAGNOSIS — R7881 Bacteremia: Secondary | ICD-10-CM | POA: Diagnosis not present

## 2023-05-17 DIAGNOSIS — R918 Other nonspecific abnormal finding of lung field: Secondary | ICD-10-CM | POA: Diagnosis not present

## 2023-05-17 DIAGNOSIS — R0902 Hypoxemia: Secondary | ICD-10-CM | POA: Diagnosis not present

## 2023-05-17 DIAGNOSIS — I10 Essential (primary) hypertension: Secondary | ICD-10-CM | POA: Diagnosis not present

## 2023-05-17 DIAGNOSIS — R457 State of emotional shock and stress, unspecified: Secondary | ICD-10-CM | POA: Diagnosis not present

## 2023-05-17 DIAGNOSIS — A403 Sepsis due to Streptococcus pneumoniae: Secondary | ICD-10-CM | POA: Diagnosis not present

## 2023-05-17 DIAGNOSIS — Z7189 Other specified counseling: Secondary | ICD-10-CM | POA: Diagnosis not present

## 2023-05-17 DIAGNOSIS — R0989 Other specified symptoms and signs involving the circulatory and respiratory systems: Secondary | ICD-10-CM | POA: Diagnosis not present

## 2023-05-17 DIAGNOSIS — I771 Stricture of artery: Secondary | ICD-10-CM | POA: Diagnosis not present

## 2023-05-17 DIAGNOSIS — N1832 Chronic kidney disease, stage 3b: Secondary | ICD-10-CM | POA: Diagnosis present

## 2023-05-17 DIAGNOSIS — I77819 Aortic ectasia, unspecified site: Secondary | ICD-10-CM | POA: Diagnosis not present

## 2023-05-17 DIAGNOSIS — R54 Age-related physical debility: Secondary | ICD-10-CM | POA: Diagnosis present

## 2023-05-17 DIAGNOSIS — Z7401 Bed confinement status: Secondary | ICD-10-CM | POA: Diagnosis not present

## 2023-05-17 DIAGNOSIS — D72829 Elevated white blood cell count, unspecified: Secondary | ICD-10-CM | POA: Diagnosis not present

## 2023-05-17 DIAGNOSIS — M6281 Muscle weakness (generalized): Secondary | ICD-10-CM | POA: Diagnosis not present

## 2023-05-17 DIAGNOSIS — Z515 Encounter for palliative care: Secondary | ICD-10-CM | POA: Diagnosis not present

## 2023-05-17 DIAGNOSIS — B953 Streptococcus pneumoniae as the cause of diseases classified elsewhere: Secondary | ICD-10-CM | POA: Diagnosis not present

## 2023-05-17 DIAGNOSIS — M549 Dorsalgia, unspecified: Secondary | ICD-10-CM | POA: Diagnosis not present

## 2023-05-17 LAB — URINALYSIS, W/ REFLEX TO CULTURE (INFECTION SUSPECTED)
Bacteria, UA: NONE SEEN
Bilirubin Urine: NEGATIVE
Glucose, UA: NEGATIVE mg/dL
Hgb urine dipstick: NEGATIVE
Ketones, ur: NEGATIVE mg/dL
Leukocytes,Ua: NEGATIVE
Nitrite: NEGATIVE
Protein, ur: NEGATIVE mg/dL
Specific Gravity, Urine: 1.014 (ref 1.005–1.030)
pH: 5 (ref 5.0–8.0)

## 2023-05-17 LAB — COMPREHENSIVE METABOLIC PANEL
ALT: 16 U/L (ref 0–44)
AST: 19 U/L (ref 15–41)
Albumin: 2.8 g/dL — ABNORMAL LOW (ref 3.5–5.0)
Alkaline Phosphatase: 127 U/L — ABNORMAL HIGH (ref 38–126)
Anion gap: 12 (ref 5–15)
BUN: 37 mg/dL — ABNORMAL HIGH (ref 8–23)
CO2: 25 mmol/L (ref 22–32)
Calcium: 8.9 mg/dL (ref 8.9–10.3)
Chloride: 96 mmol/L — ABNORMAL LOW (ref 98–111)
Creatinine, Ser: 1.17 mg/dL — ABNORMAL HIGH (ref 0.44–1.00)
GFR, Estimated: 44 mL/min — ABNORMAL LOW (ref >60)
Glucose, Bld: 125 mg/dL — ABNORMAL HIGH (ref 70–99)
Potassium: 3 mmol/L — ABNORMAL LOW (ref 3.5–5.1)
Sodium: 133 mmol/L — ABNORMAL LOW (ref 135–145)
Total Bilirubin: 1.4 mg/dL — ABNORMAL HIGH (ref 0.3–1.2)
Total Protein: 6.5 g/dL (ref 6.5–8.1)

## 2023-05-17 LAB — RESP PANEL BY RT-PCR (RSV, FLU A&B, COVID)  RVPGX2
Influenza A by PCR: NEGATIVE
Influenza B by PCR: NEGATIVE
Resp Syncytial Virus by PCR: NEGATIVE
SARS Coronavirus 2 by RT PCR: NEGATIVE

## 2023-05-17 LAB — MAGNESIUM: Magnesium: 2.1 mg/dL (ref 1.7–2.4)

## 2023-05-17 LAB — CBC WITH DIFFERENTIAL/PLATELET
Abs Immature Granulocytes: 0.44 10*3/uL — ABNORMAL HIGH (ref 0.00–0.07)
Basophils Absolute: 0.1 10*3/uL (ref 0.0–0.1)
Basophils Relative: 0 %
Eosinophils Absolute: 0 10*3/uL (ref 0.0–0.5)
Eosinophils Relative: 0 %
HCT: 30.1 % — ABNORMAL LOW (ref 36.0–46.0)
Hemoglobin: 10.8 g/dL — ABNORMAL LOW (ref 12.0–15.0)
Immature Granulocytes: 2 %
Lymphocytes Relative: 2 %
Lymphs Abs: 0.6 10*3/uL — ABNORMAL LOW (ref 0.7–4.0)
MCH: 35.2 pg — ABNORMAL HIGH (ref 26.0–34.0)
MCHC: 35.9 g/dL (ref 30.0–36.0)
MCV: 98 fL (ref 80.0–100.0)
Monocytes Absolute: 1.4 10*3/uL — ABNORMAL HIGH (ref 0.1–1.0)
Monocytes Relative: 5 %
Neutro Abs: 24.5 10*3/uL — ABNORMAL HIGH (ref 1.7–7.7)
Neutrophils Relative %: 91 %
Platelets: 279 10*3/uL (ref 150–400)
RBC: 3.07 MIL/uL — ABNORMAL LOW (ref 3.87–5.11)
RDW: 12.8 % (ref 11.5–15.5)
WBC: 27.1 10*3/uL — ABNORMAL HIGH (ref 4.0–10.5)
nRBC: 0 % (ref 0.0–0.2)

## 2023-05-17 LAB — TSH: TSH: 1.85 u[IU]/mL (ref 0.350–4.500)

## 2023-05-17 LAB — I-STAT CG4 LACTIC ACID, ED: Lactic Acid, Venous: 0.9 mmol/L (ref 0.5–1.9)

## 2023-05-17 LAB — TROPONIN I (HIGH SENSITIVITY): Troponin I (High Sensitivity): 11 ng/L (ref <18)

## 2023-05-17 LAB — BRAIN NATRIURETIC PEPTIDE: B Natriuretic Peptide: 259.8 pg/mL — ABNORMAL HIGH (ref 0.0–100.0)

## 2023-05-17 MED ORDER — SALINE SPRAY 0.65 % NA SOLN: 1.0000 | NASAL | Status: DC | PRN

## 2023-05-17 MED ORDER — AZITHROMYCIN 250 MG PO TABS
500.0000 mg | ORAL_TABLET | Freq: Once | ORAL | Status: AC
Start: 2023-05-17 — End: 2023-05-17
  Administered 2023-05-17: 500 mg via ORAL
  Filled 2023-05-17: qty 2

## 2023-05-17 MED ORDER — POTASSIUM CHLORIDE 20 MEQ PO PACK
40.0000 meq | PACK | Freq: Once | ORAL | Status: AC
  Administered 2023-05-17: 40 meq via ORAL
  Filled 2023-05-17: qty 2

## 2023-05-17 MED ORDER — ENOXAPARIN SODIUM 30 MG/0.3ML IJ SOSY
30.0000 mg | PREFILLED_SYRINGE | INTRAMUSCULAR | Status: DC
  Administered 2023-05-17: 30 mg via SUBCUTANEOUS
  Filled 2023-05-17: qty 0.3

## 2023-05-17 MED ORDER — SODIUM CHLORIDE 0.9 % IV SOLN
500.0000 mg | INTRAVENOUS | Status: DC
  Filled 2023-05-17: qty 5

## 2023-05-17 MED ORDER — ALBUTEROL SULFATE (2.5 MG/3ML) 0.083% IN NEBU
2.5000 mg | INHALATION_SOLUTION | RESPIRATORY_TRACT | Status: DC | PRN
  Administered 2023-05-19: 2.5 mg via RESPIRATORY_TRACT
  Filled 2023-05-17: qty 3

## 2023-05-17 MED ORDER — POTASSIUM CHLORIDE CRYS ER 20 MEQ PO TBCR
40.0000 meq | EXTENDED_RELEASE_TABLET | Freq: Once | ORAL | Status: DC
Start: 2023-05-17 — End: 2023-05-17

## 2023-05-17 MED ORDER — SODIUM CHLORIDE 0.9 % IV SOLN: 1.0000 g | INTRAVENOUS | Status: DC

## 2023-05-17 MED ORDER — CARMEX CLASSIC LIP BALM EX OINT: 1.0000 | TOPICAL_OINTMENT | CUTANEOUS | Status: DC | PRN

## 2023-05-17 MED ORDER — IBUPROFEN 200 MG PO TABS
600.0000 mg | ORAL_TABLET | Freq: Once | ORAL | Status: AC
  Administered 2023-05-17: 600 mg via ORAL
  Filled 2023-05-17: qty 3

## 2023-05-17 MED ORDER — LIDOCAINE-EPINEPHRINE (PF) 2 %-1:200000 IJ SOLN
INTRAMUSCULAR | Status: AC
  Administered 2023-05-17: 20 mL
  Filled 2023-05-17: qty 20

## 2023-05-17 MED ORDER — SODIUM CHLORIDE 0.9 % IV SOLN
1.0000 g | Freq: Once | INTRAVENOUS | Status: DC
  Filled 2023-05-17: qty 10

## 2023-05-17 MED ORDER — LIDOCAINE HCL (PF) 1 % IJ SOLN
INTRAMUSCULAR | Status: AC
  Administered 2023-05-17: 2.1 mL
  Filled 2023-05-17: qty 30

## 2023-05-17 MED ORDER — ACETAMINOPHEN 325 MG PO TABS
650.0000 mg | ORAL_TABLET | Freq: Four times a day (QID) | ORAL | Status: DC | PRN
  Administered 2023-05-18 – 2023-05-27 (×13): 650 mg via ORAL
  Filled 2023-05-17 (×14): qty 2

## 2023-05-17 MED ORDER — GUAIFENESIN-DM 100-10 MG/5ML PO SYRP
5.0000 mL | ORAL_SOLUTION | ORAL | Status: DC | PRN
  Administered 2023-05-19 – 2023-05-27 (×12): 5 mL via ORAL
  Filled 2023-05-17 (×12): qty 10

## 2023-05-17 MED ORDER — SODIUM CHLORIDE 0.9% FLUSH: 10.0000 mL | INTRAVENOUS | Status: DC | PRN

## 2023-05-17 MED ORDER — ACETAMINOPHEN 650 MG RE SUPP
650.0000 mg | Freq: Four times a day (QID) | RECTAL | Status: DC | PRN
  Administered 2023-05-25: 650 mg via RECTAL

## 2023-05-17 MED ORDER — SENNOSIDES-DOCUSATE SODIUM 8.6-50 MG PO TABS
1.0000 | ORAL_TABLET | Freq: Every evening | ORAL | Status: DC | PRN
Start: 2023-05-17 — End: 2023-05-27

## 2023-05-17 MED ORDER — ENOXAPARIN SODIUM 40 MG/0.4ML IJ SOSY: 40.0000 mg | PREFILLED_SYRINGE | INTRAMUSCULAR | Status: DC

## 2023-05-17 MED ORDER — CEFTRIAXONE SODIUM 1 G IJ SOLR
1.0000 g | Freq: Once | INTRAMUSCULAR | Status: AC
  Administered 2023-05-17: 1 g via INTRAMUSCULAR
  Filled 2023-05-17: qty 10

## 2023-05-17 MED ORDER — SODIUM CHLORIDE 0.9% FLUSH
10.0000 mL | Freq: Two times a day (BID) | INTRAVENOUS | Status: DC
  Administered 2023-05-19 – 2023-05-27 (×6): 10 mL

## 2023-05-17 MED ORDER — CHLORHEXIDINE GLUCONATE CLOTH 2 % EX PADS
6.0000 | MEDICATED_PAD | Freq: Every day | CUTANEOUS | Status: DC
  Administered 2023-05-17 – 2023-05-27 (×11): 6 via TOPICAL

## 2023-05-17 NOTE — ED Notes (Signed)
ED TO INPATIENT HANDOFF REPORT  ED Nurse Name and Phone #: Cat   S Name/Age/Gender Alicia Schwartz 89 y.o. female Room/Bed: WA03/WA03  Code Status   Code Status: Do not attempt resuscitation (DNR) PRE-ARREST INTERVENTIONS DESIRED  Home/SNF/Other Home Oriented to : None Is this baseline? Yes   Triage Complete: Triage complete  Chief Complaint Sepsis due to pneumonia (HCC) [J18.9, A41.9]  Triage Note The pt was bib EMS. The pts son called and reported back pain.The back pain is a chronic condition. Apparently, a week ago she had became ill and was in bed and has been bedbound ever since. The pt is ambulatory at baseline but weak today and unable to get up. The pt has not been tested for Covid. The social worker came out yesterday to get he placed in facility or order [facility]. Pt refused to go to facility because she does not want them to take all of her money.  VS B/P 130/70, O2 Sats 88%RA, placed on O2@@L /[place] 89 to 90%, increased to 3L/[place] sats 93-94%, CBG 157, T 97.2. No history of Afib. Heart was bouncing around. Does have a PMHx HTN, CHF, CKD.   Allergies No Known Allergies  Level of Care/Admitting Diagnosis ED Disposition     ED Disposition  Admit   Condition  --   Comment  Hospital Area: [facility] [[id number]]  Level of Care: Progressive [102]  Admit to Progressive based on following criteria: MULTISYSTEM THREATS such as stable sepsis, metabolic/electrolyte imbalance with or without encephalopathy that is responding to early treatment.  May admit patient to [facility] or [facility] if equivalent level of care is available:: Yes  Covid Evaluation: Asymptomatic - no recent exposure (last 10 days) testing not required  Diagnosis: Sepsis due to pneumonia [facility]) [[id number]]  Admitting Physician: Maryln Gottron [[id number]]  Attending Physician: Kirby Crigler, Parks Neptune [[id number]]  Certification:: I certify this patient will need inpatient services for at least 2  midnights  Expected Medical Readiness: 05/20/2023          B Medical/Surgery History Past Medical History:  Diagnosis Date   Fracture, subtrochanteric, right femur, closed (HCC) 02/11/2015   Hypertension    Paroxysmal supraventricular tachycardia (HCC) 02/12/2015   Past Surgical History:  Procedure Laterality Date   ABDOMINAL HYSTERECTOMY     INTRAMEDULLARY (IM) NAIL INTERTROCHANTERIC Right 02/12/2015   Procedure: INTRAMEDULLARY (IM) NAIL INTERTROCHANTRIC;  Surgeon: Teryl Lucy, MD;  Location: [facility] OR;  Service: Orthopedics;  Laterality: Right;   IR VERTEBROPLASTY CERV/THOR BX INC UNI/BIL INC/INJECT/IMAGING  07/08/2019     A IV Location/Drains/Wounds Patient Lines/Drains/Airways Status     Active Line/Drains/Airways     Name Placement date Placement time Site Days   Peripheral IV 05/17/23 20 G Anterior;Left External jugular 05/17/23  1250  External jugular  less than 1   Pressure Injury 07/04/19 Sacrum Medial;Posterior Stage I -  Intact skin with non-blanchable redness of a localized area usually over a bony prominence. 07/04/19  0400  -- 1413            Intake/Output Last 24 hours No intake or output data in the 24 hours ending 05/17/23 1658  Labs/Imaging Results for orders placed or performed during the hospital encounter of 05/17/23 (from the past 48 hour(s))  Resp panel by RT-PCR (RSV, Flu A&B, Covid) Anterior Nasal Swab     Status: None   Collection Time: 05/17/23 10:40 AM   Specimen: Anterior Nasal Swab  Result Value Ref Range   SARS  Coronavirus 2 by RT PCR NEGATIVE NEGATIVE    Comment: (NOTE) SARS-CoV-2 target nucleic acids are NOT DETECTED.  The SARS-CoV-2 RNA is generally detectable in upper respiratory specimens during the acute phase of infection. The lowest concentration of SARS-CoV-2 viral copies this assay can detect is 138 copies/mL. A negative result does not preclude SARS-Cov-2 infection and should not be used as the sole basis for treatment  or other patient management decisions. A negative result may occur with  improper specimen collection/handling, submission of specimen other than nasopharyngeal swab, presence of viral mutation(s) within the areas targeted by this assay, and inadequate number of viral copies(<138 copies/mL). A negative result must be combined with clinical observations, patient history, and epidemiological information. The expected result is Negative.  Fact Sheet for Patients:  BloggerCourse.com  Fact Sheet for Healthcare Providers:  SeriousBroker.it  This test is no t yet approved or cleared by the [place] FDA and  has been authorized for detection and/or diagnosis of SARS-CoV-2 by FDA under an Emergency Use Authorization (EUA). This EUA will remain  in effect (meaning this test can be used) for the duration of the COVID-19 declaration under Section 564(b)(1) of the Act, 21 U.S.C.section 360bbb-3(b)(1), unless the authorization is terminated  or revoked sooner.       Influenza A by PCR NEGATIVE NEGATIVE   Influenza B by PCR NEGATIVE NEGATIVE    Comment: (NOTE) The Xpert Xpress SARS-CoV-2/FLU/RSV plus assay is intended as an aid in the diagnosis of influenza from Nasopharyngeal swab specimens and should not be used as a sole basis for treatment. Nasal washings and aspirates are unacceptable for Xpert Xpress SARS-CoV-2/FLU/RSV testing.  Fact Sheet for Patients: BloggerCourse.com  Fact Sheet for Healthcare Providers: SeriousBroker.it  This test is not yet approved or cleared by the [place] FDA and has been authorized for detection and/or diagnosis of SARS-CoV-2 by FDA under an Emergency Use Authorization (EUA). This EUA will remain in effect (meaning this test can be used) for the duration of the COVID-19 declaration under Section 564(b)(1) of the Act, 21 U.S.C. section  360bbb-3(b)(1), unless the authorization is terminated or revoked.     Resp Syncytial Virus by PCR NEGATIVE NEGATIVE    Comment: (NOTE) Fact Sheet for Patients: BloggerCourse.com  Fact Sheet for Healthcare Providers: SeriousBroker.it  This test is not yet approved or cleared by the [place] FDA and has been authorized for detection and/or diagnosis of SARS-CoV-2 by FDA under an Emergency Use Authorization (EUA). This EUA will remain in effect (meaning this test can be used) for the duration of the COVID-19 declaration under Section 564(b)(1) of the Act, 21 U.S.C. section 360bbb-3(b)(1), unless the authorization is terminated or revoked.  Performed at [facility], [address], [place], [place] [address]   Comprehensive metabolic panel     Status: Abnormal   Collection Time: 05/17/23  1:03 PM  Result Value Ref Range   Sodium 133 (L) 135 - 145 mmol/L   Potassium 3.0 (L) 3.5 - 5.1 mmol/L   Chloride 96 (L) 98 - 111 mmol/L   CO2 25 22 - 32 mmol/L   Glucose, Bld 125 (H) 70 - 99 mg/dL    Comment: Glucose reference range applies only to samples taken after fasting for at least 8 hours.   BUN 37 (H) 8 - 23 mg/dL   Creatinine, Ser 3.66 (H) 0.44 - 1.00 mg/dL   Calcium 8.9 8.9 - 44.0 mg/dL   Total Protein 6.5 6.5 - 8.1 g/dL  Albumin 2.8 (L) 3.5 - 5.0 g/dL   AST 19 15 - 41 U/L   ALT 16 0 - 44 U/L   Alkaline Phosphatase 127 (H) 38 - 126 U/L   Total Bilirubin 1.4 (H) 0.3 - 1.2 mg/dL   GFR, Estimated 44 (L) >60 mL/min    Comment: (NOTE) Calculated using the CKD-EPI Creatinine Equation (2021)    Anion gap 12 5 - 15    Comment: Performed at [facility], [address], [place], [place] [address]  CBC with Differential     Status: Abnormal   Collection Time: 05/17/23  1:03 PM  Result Value Ref Range   WBC 27.1 (H) 4.0 - 10.5 K/uL   RBC 3.07 (L) 3.87 - 5.11 MIL/uL   Hemoglobin 10.8 (L)  12.0 - 15.0 g/dL   HCT 13.0 (L) 86.5 - 78.4 %   MCV 98.0 80.0 - 100.0 fL   MCH 35.2 (H) 26.0 - 34.0 pg   MCHC 35.9 30.0 - 36.0 g/dL   RDW 69.6 29.5 - 28.4 %   Platelets 279 150 - 400 K/uL   nRBC 0.0 0.0 - 0.2 %   Neutrophils Relative % 91 %   Neutro Abs 24.5 (H) 1.7 - 7.7 K/uL   Lymphocytes Relative 2 %   Lymphs Abs 0.6 (L) 0.7 - 4.0 K/uL   Monocytes Relative 5 %   Monocytes Absolute 1.4 (H) 0.1 - 1.0 K/uL   Eosinophils Relative 0 %   Eosinophils Absolute 0.0 0.0 - 0.5 K/uL   Basophils Relative 0 %   Basophils Absolute 0.1 0.0 - 0.1 K/uL   Immature Granulocytes 2 %   Abs Immature Granulocytes 0.44 (H) 0.00 - 0.07 K/uL    Comment: Performed at [facility], [address] [place], [place] [address]  Troponin I (High Sensitivity)     Status: None   Collection Time: 05/17/23  1:03 PM  Result Value Ref Range   Troponin I (High Sensitivity) 11 <18 ng/L    Comment: (NOTE) Elevated high sensitivity troponin I (hsTnI) values and significant  changes across serial measurements may suggest ACS but many other  chronic and acute conditions are known to elevate hsTnI results.  Refer to the "Links" section for chest pain algorithms and additional  guidance. Performed at [facility], [address] [place], [place] [address]   Magnesium     Status: None   Collection Time: 05/17/23  1:03 PM  Result Value Ref Range   Magnesium 2.1 1.7 - 2.4 mg/dL    Comment: Performed at [facility], [address], [place], [place] [address]  I-Stat Lactic Acid, ED     Status: None   Collection Time: 05/17/23  1:04 PM  Result Value Ref Range   Lactic Acid, Venous 0.9 0.5 - 1.9 mmol/L  Brain natriuretic peptide     Status: Abnormal   Collection Time: 05/17/23  1:05 PM  Result Value Ref Range   B Natriuretic Peptide 259.8 (H) 0.0 - 100.0 pg/mL    Comment: Performed at [facility], [address], [place], [place] [address]   Urinalysis, w/ Reflex to Culture (Infection Suspected) -Urine, Catheterized     Status: None   Collection Time: 05/17/23  1:24 PM  Result Value Ref Range   Specimen Source URINE, CATHETERIZED    Color, Urine YELLOW YELLOW   APPearance CLEAR CLEAR   Specific Gravity, Urine 1.014 1.005 - 1.030   pH 5.0  5.0 - 8.0   Glucose, UA NEGATIVE NEGATIVE mg/dL   Hgb urine dipstick NEGATIVE NEGATIVE   Bilirubin Urine NEGATIVE NEGATIVE   Ketones, ur NEGATIVE NEGATIVE mg/dL   Protein, ur NEGATIVE NEGATIVE mg/dL   Nitrite NEGATIVE NEGATIVE   Leukocytes,Ua NEGATIVE NEGATIVE   RBC / HPF 0-5 0 - 5 RBC/hpf   WBC, UA 0-5 0 - 5 WBC/hpf    Comment:        Reflex urine culture not performed if WBC <=10, OR if Squamous epithelial cells >5. If Squamous epithelial cells >5 suggest recollection.    Bacteria, UA NONE SEEN NONE SEEN   Squamous Epithelial / HPF 0-5 0 - 5 /HPF    Comment: Performed at [facility], [address] [place], [place] [address]   [place] EKG SITE RITE  Result Date: 05/17/2023 If Site Rite image not attached, placement could not be confirmed due to current cardiac rhythm.  DG Chest Port 1 View  Result Date: 05/17/2023 CLINICAL DATA:  Sepsis EXAM: PORTABLE CHEST 1 VIEW COMPARISON:  X-ray 02/23/2022 FINDINGS: Enlarged cardiopericardial silhouette with calcified tortuous and ectatic aorta. Vascular congestion. Small right effusion. There is rounded opacity in the right lower thorax which could be fluid along the fissure but has a differential. No separate pneumothorax or consolidation. Overlapping cardiac leads. IMPRESSION: Enlarged heart with dilated calcified aorta.  Vascular congestion. Right-sided pleural effusion. Separate rounded opacity in the right lung base could be fluid tracking along fissure but underlying lesion is not excluded. Recommend short follow-up or CT Electronically Signed   By: Karen Kays M.D.   On: 05/17/2023 11:33    Pending Labs Unresulted  Labs (From admission, onward)     Start     Ordered   05/18/23 0500  Basic metabolic panel  Tomorrow morning,   R        05/17/23 1412   05/18/23 0500  CBC  Tomorrow morning,   R        05/17/23 1412   05/17/23 1429  TSH  Once,   R        05/17/23 1428   05/17/23 1021  Blood Culture (routine x 2)  (Undifferentiated presentation (screening labs and basic nursing orders))  BLOOD CULTURE X 2,   STAT      05/17/23 1022            Vitals/Pain Today's Vitals   05/17/23 1100 05/17/23 1400 05/17/23 1500 05/17/23 1519  BP: (!) 138/93 134/76 137/74   Pulse: (!) 119 (!) 119 (!) 112 100  Resp: (!) 23 (!) 22 19 19   Temp:    98.3 F (36.8 C)  TempSrc:    Oral  SpO2: 97% 96% 96% 97%  Weight:      Height:      PainSc:        Isolation Precautions No active isolations  Medications Medications  cefTRIAXone (ROCEPHIN) 1 g in sodium chloride 0.9 % 100 mL IVPB (1 g Intravenous Not Given 05/17/23 1353)  sodium chloride flush (NS) 0.9 % injection 10-40 mL (has no administration in time range)  sodium chloride flush (NS) 0.9 % injection 10-40 mL (has no administration in time range)  enoxaparin (LOVENOX) injection 40 mg (has no administration in time range)  acetaminophen (TYLENOL) tablet 650 mg (has no administration in time range)    Or  acetaminophen (TYLENOL) suppository 650 mg (has no administration in time range)  senna-docusate (Senokot-S) tablet 1 tablet (has no administration in time range)  albuterol (PROVENTIL) (2.5 MG/3ML) 0.083% nebulizer solution 2.5 mg (has no administration in time range)  azithromycin (ZITHROMAX) 500 mg in sodium chloride 0.9 % 250 mL IVPB (has no administration in time range)  cefTRIAXone (ROCEPHIN) 1 g in sodium chloride 0.9 % 100 mL IVPB (has no administration in time range)  lidocaine-EPINEPHrine (XYLOCAINE W/EPI) 2 %-1:200000 (PF) injection (20 mLs  Given by Other 05/17/23 1327)  azithromycin (ZITHROMAX) tablet 500 mg (500 mg Oral Given 05/17/23 1327)   cefTRIAXone (ROCEPHIN) injection 1 g (1 g Intramuscular Given 05/17/23 1450)  ibuprofen (ADVIL) tablet 600 mg (600 mg Oral Given 05/17/23 1451)  potassium chloride (KLOR-CON) packet 40 mEq (40 mEq Oral Given 05/17/23 1451)  lidocaine (PF) (XYLOCAINE) 1 % injection (2.1 mLs  Given 05/17/23 1455)    Mobility non-ambulatory     Focused Assessments Pulmonary Assessment Handoff:  Lung sounds:          R Recommendations: See Admitting Provider Note  Report given to:   Additional Notes: Awaiting PICC placement

## 2023-05-17 NOTE — ED Provider Notes (Signed)
[place] EMERGENCY DEPARTMENT AT [facility] Provider Note   CSN: [id number] Arrival date & time: 05/17/23  0941     History  Chief Complaint  Patient presents with   Weakness   Cough    Alicia Schwartz is a 89 y.o. female.   Weakness Associated symptoms: cough   Cough  Patient has a history of paroxysmal SVT, hypertensive urgency, T12 fracture, chronic back pain, AKI, hypokalemia, hypertension.  Patient lives at home.  She has some mobility issues but has refused skilled nursing facility placement and has home health assistance.  Patient however normally is able to get around in her house.  However in the last few days she has been getting weaker.  She started having cough congestion about a week ago.  She has been getting weaker and more ill.  Patient has been bedbound for the last several days.  [place] they did note patient had an oxygen requirement.  Her heart rate was also irregular    Home Medications Prior to Admission medications   Medication Sig Start Date End Date Taking? Authorizing Provider  acetaminophen (TYLENOL) 325 MG tablet Take 2 tablets (650 mg total) by mouth every 6 (six) hours as needed for mild pain (or Fever >/= 101). 07/12/19   Calvert Cantor, MD  ferrous sulfate 325 (65 FE) MG tablet Take 1 tablet (325 mg total) by mouth daily with breakfast. 02/27/22   Lewie Chamber, MD  folic acid (FOLVITE) 1 MG tablet Take 1 mg by mouth daily.    [provider]  losartan (COZAAR) 25 MG tablet Take 3 tablets (75 mg total) by mouth daily. 02/27/22   Lewie Chamber, MD  metoprolol succinate (TOPROL-XL) 25 MG 24 hr tablet Take 1 tablet (25 mg total) by mouth daily. 02/27/22   Lewie Chamber, MD  Multiple Vitamin (MULTIVITAMIN) tablet Take 1 tablet by mouth daily.    [provider]  sertraline (ZOLOFT) 25 MG tablet Take 25 mg by mouth daily.    [provider]      Allergies    Patient has no known allergies.    Review of  Systems   Review of Systems  Respiratory:  Positive for cough.   Neurological:  Positive for weakness.    Physical Exam Updated Vital Signs BP 134/76   Pulse (!) 119   Temp 98.5 F (36.9 C) (Oral)   Resp (!) 22   Ht 1.499 m (4\' 11" )   Wt 46.9 kg   SpO2 96%   BMI 20.88 kg/m  Physical Exam Vitals and nursing note reviewed.  Constitutional:      Appearance: She is well-developed. She is not diaphoretic.     Comments: Elderly, frail  HENT:     Head: Normocephalic and atraumatic.     Right Ear: External ear normal.     Left Ear: External ear normal.     Mouth/Throat:     Mouth: Mucous membranes are dry.  Eyes:     General: No scleral icterus.       Right eye: No discharge.        Left eye: No discharge.     Conjunctiva/sclera: Conjunctivae normal.  Neck:     Trachea: No tracheal deviation.  Cardiovascular:     Rate and Rhythm: Normal rate and regular rhythm.  Pulmonary:     Effort: Pulmonary effort is normal. No respiratory distress.     Breath sounds: No stridor. Rhonchi and rales present. No wheezing.  Abdominal:  General: Bowel sounds are normal. There is no distension.     Palpations: Abdomen is soft.     Tenderness: There is no abdominal tenderness. There is no guarding or rebound.  Musculoskeletal:        General: No tenderness or deformity.     Cervical back: Neck supple.     Right lower leg: No edema.     Left lower leg: No edema.     Comments: Patient able to sit up with some assistance no focal spinal tenderness  Skin:    General: Skin is warm and dry.     Findings: No rash.  Neurological:     General: No focal deficit present.     Mental Status: She is alert.     Cranial Nerves: No cranial nerve deficit, dysarthria or facial asymmetry.     Sensory: No sensory deficit.     Motor: Weakness present. No abnormal muscle tone or seizure activity.     Coordination: Coordination normal.     Comments: Generalized weakness but patient is able to move both  hands, is able to lift both legs off the bed  Psychiatric:        Mood and Affect: Mood normal.     ED Results / Procedures / Treatments   Labs (all labs ordered are listed, but only abnormal results are displayed) Labs Reviewed  COMPREHENSIVE METABOLIC PANEL - Abnormal; Notable for the following components:      Result Value   Sodium 133 (*)    Potassium 3.0 (*)    Chloride 96 (*)    Glucose, Bld 125 (*)    BUN 37 (*)    Creatinine, Ser 1.17 (*)    Albumin 2.8 (*)    Alkaline Phosphatase 127 (*)    Total Bilirubin 1.4 (*)    GFR, Estimated 44 (*)    All other components within normal limits  CBC WITH DIFFERENTIAL/PLATELET - Abnormal; Notable for the following components:   WBC 27.1 (*)    RBC 3.07 (*)    Hemoglobin 10.8 (*)    HCT 30.1 (*)    MCH 35.2 (*)    Neutro Abs 24.5 (*)    Lymphs Abs 0.6 (*)    Monocytes Absolute 1.4 (*)    Abs Immature Granulocytes 0.44 (*)    All other components within normal limits  BRAIN NATRIURETIC PEPTIDE - Abnormal; Notable for the following components:   B Natriuretic Peptide 259.8 (*)    All other components within normal limits  RESP PANEL BY RT-PCR (RSV, FLU A&B, COVID)  RVPGX2  CULTURE, BLOOD (ROUTINE X 2)  CULTURE, BLOOD (ROUTINE X 2)  URINALYSIS, W/ REFLEX TO CULTURE (INFECTION SUSPECTED)  I-STAT CG4 LACTIC ACID, ED  I-STAT CG4 LACTIC ACID, ED  TROPONIN I (HIGH SENSITIVITY)    EKG EKG Interpretation Date/Time:  Friday May 17 2023 10:01:54 EDT Ventricular Rate:  126 PR Interval:    QRS Duration:  87 QT Interval:  306 QTC Calculation: 443 R Axis:   51  Text Interpretation: Atrial fibrillation Low voltage, extremity leads Repolarization abnormality, prob rate related atrial fibrillation is new since last tracing Confirmed by Linwood Dibbles [id number]) on 05/17/2023 11:23:35 AM  Radiology [place] EKG SITE RITE  Result Date: 05/17/2023 If Site Rite image not attached, placement could not be confirmed due to current cardiac  rhythm.  DG Chest Port 1 View  Result Date: 05/17/2023 CLINICAL DATA:  Sepsis EXAM: PORTABLE CHEST 1 VIEW COMPARISON:  X-ray 02/23/2022 FINDINGS:  Enlarged cardiopericardial silhouette with calcified tortuous and ectatic aorta. Vascular congestion. Small right effusion. There is rounded opacity in the right lower thorax which could be fluid along the fissure but has a differential. No separate pneumothorax or consolidation. Overlapping cardiac leads. IMPRESSION: Enlarged heart with dilated calcified aorta.  Vascular congestion. Right-sided pleural effusion. Separate rounded opacity in the right lung base could be fluid tracking along fissure but underlying lesion is not excluded. Recommend short follow-up or CT Electronically Signed   By: Karen Kays M.D.   On: 05/17/2023 11:33    Procedures Ultrasound ED Peripheral IV (Provider)  Date/Time: 05/17/2023 12:55 PM  Performed by: Linwood Dibbles, MD Authorized by: Linwood Dibbles, MD   Procedure details:    Indications: multiple failed IV attempts     Skin Prep: chlorhexidine gluconate     Location: left IJ.   Angiocath:  18 G   Bedside Ultrasound Guided: Yes     Images: not archived     Patient tolerated procedure without complications: Yes     Dressing applied: Yes   Comments:     Temporary IV placement.  Pt will need additional IV placement later  .Critical Care  Performed by: Linwood Dibbles, MD Authorized by: Linwood Dibbles, MD   Critical care provider statement:    Critical care time (minutes):  30   Critical care was time spent personally by me on the following activities:  Development of treatment plan with patient or surrogate, discussions with consultants, evaluation of patient's response to treatment, examination of patient, ordering and review of laboratory studies, ordering and review of radiographic studies, ordering and performing treatments and interventions, pulse oximetry, re-evaluation of patient's condition and review of old charts      Medications Ordered in ED Medications  cefTRIAXone (ROCEPHIN) 1 g in sodium chloride 0.9 % 100 mL IVPB (1 g Intravenous Not Given 05/17/23 1353)  sodium chloride flush (NS) 0.9 % injection 10-40 mL (has no administration in time range)  sodium chloride flush (NS) 0.9 % injection 10-40 mL (has no administration in time range)  cefTRIAXone (ROCEPHIN) injection 1 g (has no administration in time range)  ibuprofen (ADVIL) tablet 600 mg (has no administration in time range)  potassium chloride SA (KLOR-CON M) CR tablet 40 mEq (has no administration in time range)  enoxaparin (LOVENOX) injection 40 mg (has no administration in time range)  acetaminophen (TYLENOL) tablet 650 mg (has no administration in time range)    Or  acetaminophen (TYLENOL) suppository 650 mg (has no administration in time range)  senna-docusate (Senokot-S) tablet 1 tablet (has no administration in time range)  albuterol (PROVENTIL) (2.5 MG/3ML) 0.083% nebulizer solution 2.5 mg (has no administration in time range)  azithromycin (ZITHROMAX) 500 mg in sodium chloride 0.9 % 250 mL IVPB (has no administration in time range)  cefTRIAXone (ROCEPHIN) 1 g in sodium chloride 0.9 % 100 mL IVPB (has no administration in time range)  lidocaine-EPINEPHrine (XYLOCAINE W/EPI) 2 %-1:200000 (PF) injection (20 mLs  Given by Other 05/17/23 1327)  azithromycin (ZITHROMAX) tablet 500 mg (500 mg Oral Given 05/17/23 1327)    ED Course/ Medical Decision Making/ A&P Clinical Course as of 05/17/23 1420  Fri May 17, 2023  1231 COVID flu influenza negative [JK]  1231 Chest x-ray shows pleural effusion, rounded opacity on the right lung base [JK]  1344 IV no longer functioning.  Attempted to draw back, without success.   [JK]    Clinical Course User Index [JK] Linwood Dibbles, MD  Medical Decision Making Problems Addressed: Atrial fibrillation, unspecified type [facility]): undiagnosed new problem with uncertain  prognosis Community acquired pneumonia of right lung, unspecified part of lung: acute illness or injury that poses a threat to life or bodily functions Pleural effusion: acute illness or injury that poses a threat to life or bodily functions  Amount and/or Complexity of Data Reviewed Labs: ordered. Radiology: ordered. ECG/medicine tests: ordered.  Risk OTC drugs. Prescription drug management. Decision regarding hospitalization.   Patient presented to ED for evaluation of increasing weakness.  Patient had recent URI signs symptoms.  In the ED patient noted to have rhonchi.  Symptoms suggestive of respiratory infection.  Patient noted to be in atrial fibrillation.  No prior history of same.  No severe tachycardia noted.  Labs notable for significant leukocytosis.  Chest x-ray shows pleural effusion and questionably mass although this could be a pneumonia.  IV was placed by me but unfortunately it thrombosed and after drawing blood we were not able to administer additional medications.  I have requested a midline placement to continue her treatment as there were multiple attempts to place peripheral IV.  Will plan on admission to the hospital for further treatment and evaluation.  Case discussed with Dr. Erenest Blank        Final Clinical Impression(s) / ED Diagnoses Final diagnoses:  Community acquired pneumonia of right lung, unspecified part of lung  Pleural effusion  Atrial fibrillation, unspecified type [facility])    Rx / DC Orders ED Discharge Orders     None         Linwood Dibbles, MD 05/17/23 1420

## 2023-05-17 NOTE — H&P (Signed)
History and Physical  Alicia Bonder ZHY:[id number] DOB: 09-24-1930 DOA: 05/17/2023  PCP: Linus Galas, NP   Chief Complaint: Weakness, hypoxia  HPI: Alicia Schwartz is a 89 y.o. female with medical history significant for hypertension, paroxysmal SVT being admitted to the hospital with acute hypoxic respiratory failure and weakness likely due to community-acquired pneumonia.  History of provided by my conversation with the ER provider, patient is unable to provide much history.  Apparently, she lives on her own with the assistance of some social work, she has been refusing to be moved to a facility.  Seems that she typically is able to perform some of her ADLs on her own, and ambulate.  However in the last week, she has been having acute on chronic low back pain, and very weak.  She tells me that she has been unable to walk.  She denies any fevers, chills, cough, shortness of breath.  However when EMS was called to her home by social work, they noted that she was saturating 88% on room air.  Otherwise her vital signs were unremarkable, she was placed on 3 L nasal cannula and has been saturating well.  Here in the emergency department was found to be in atrial fibrillation.  Heart rate about 109, has been afebrile and saturating 97% on 3 L.  Lab work reveals WBC 27, hemoglobin 11, renal function stable at 1.17 potassium 3.0, chest x-ray as noted below with evidence of small right-sided pleural effusion and possible consolidation versus mass.  Urinalysis is unremarkable.  Blood cultures were obtained.  She received empiric IV antibiotics to cover for community-acquired pneumonia, and hospitalist consultation was requested.  Review of Systems: Please see HPI for pertinent positives and negatives. A complete 10 system review of systems are otherwise negative.  Past Medical History:  Diagnosis Date   Fracture, subtrochanteric, right femur, closed (HCC) 02/11/2015   Hypertension    Paroxysmal  supraventricular tachycardia (HCC) 02/12/2015   Past Surgical History:  Procedure Laterality Date   ABDOMINAL HYSTERECTOMY     INTRAMEDULLARY (IM) NAIL INTERTROCHANTERIC Right 02/12/2015   Procedure: INTRAMEDULLARY (IM) NAIL INTERTROCHANTRIC;  Surgeon: Teryl Lucy, MD;  Location: [facility] OR;  Service: Orthopedics;  Laterality: Right;   IR VERTEBROPLASTY CERV/THOR BX INC UNI/BIL INC/INJECT/IMAGING  07/08/2019    Social History:  reports that she has never smoked. She has never used smokeless tobacco. She reports that she does not drink alcohol. No history on file for drug use.   No Known Allergies  Family History  Problem Relation Age of Onset   Alzheimer's disease Mother    Prostate cancer Father      Prior to Admission medications   Medication Sig Start Date End Date Taking? Authorizing Provider  acetaminophen (TYLENOL) 325 MG tablet Take 2 tablets (650 mg total) by mouth every 6 (six) hours as needed for mild pain (or Fever >/= 101). 07/12/19   Calvert Cantor, MD  ferrous sulfate 325 (65 FE) MG tablet Take 1 tablet (325 mg total) by mouth daily with breakfast. 02/27/22   Lewie Chamber, MD  folic acid (FOLVITE) 1 MG tablet Take 1 mg by mouth daily.    [provider]  losartan (COZAAR) 25 MG tablet Take 3 tablets (75 mg total) by mouth daily. 02/27/22   Lewie Chamber, MD  metoprolol succinate (TOPROL-XL) 25 MG 24 hr tablet Take 1 tablet (25 mg total) by mouth daily. 02/27/22   Lewie Chamber, MD  Multiple Vitamin (MULTIVITAMIN) tablet Take 1 tablet by mouth  daily.    [provider]  sertraline (ZOLOFT) 25 MG tablet Take 25 mg by mouth daily.    [provider]    Physical Exam: BP 134/76   Pulse (!) 119   Temp 98.5 F (36.9 C) (Oral)   Resp (!) 22   Ht 4\' 11"  (1.499 m)   Wt 46.9 kg   SpO2 96%   BMI 20.88 kg/m   General:  Alert, oriented to self only, calm, in no acute distress, resting comfortably on 3 L nasal cannula oxygen.  She appears her stated age,  slightly emaciated. Cardiovascular: Tachycardic and irregular, no murmurs or rubs, no peripheral edema  Respiratory: She has diffuse rhonchi and some crackles at the right lung base.  No wheezing, tachypnea, cough, or respiratory distress. Abdomen: soft, nontender, nondistended Skin: dry, no rashes  Musculoskeletal: no joint effusions, normal range of motion  Psychiatric: appropriate affect, normal speech  Neurologic: extraocular muscles intact, clear speech, moving all extremities with intact sensorium         Labs on Admission:  Basic Metabolic Panel: Recent Labs  Lab 05/17/23 1303  NA 133*  K 3.0*  CL 96*  CO2 25  GLUCOSE 125*  BUN 37*  CREATININE 1.17*  CALCIUM 8.9   Liver Function Tests: Recent Labs  Lab 05/17/23 1303  AST 19  ALT 16  ALKPHOS 127*  BILITOT 1.4*  PROT 6.5  ALBUMIN 2.8*   No results for input(s): "LIPASE", "AMYLASE" in the last 168 hours. No results for input(s): "AMMONIA" in the last 168 hours. CBC: Recent Labs  Lab 05/17/23 1303  WBC 27.1*  NEUTROABS 24.5*  HGB 10.8*  HCT 30.1*  MCV 98.0  PLT 279   Cardiac Enzymes: No results for input(s): "CKTOTAL", "CKMB", "CKMBINDEX", "TROPONINI" in the last 168 hours.  BNP (last 3 results) Recent Labs    05/17/23 1305  BNP 259.8*    ProBNP (last 3 results) No results for input(s): "PROBNP" in the last 8760 hours.  CBG: No results for input(s): "GLUCAP" in the last 168 hours.  Radiological Exams on Admission: [place] EKG SITE RITE  Result Date: 05/17/2023 If Site Rite image not attached, placement could not be confirmed due to current cardiac rhythm.  DG Chest Port 1 View  Result Date: 05/17/2023 CLINICAL DATA:  Sepsis EXAM: PORTABLE CHEST 1 VIEW COMPARISON:  X-ray 02/23/2022 FINDINGS: Enlarged cardiopericardial silhouette with calcified tortuous and ectatic aorta. Vascular congestion. Small right effusion. There is rounded opacity in the right lower thorax which could be fluid along the  fissure but has a differential. No separate pneumothorax or consolidation. Overlapping cardiac leads. IMPRESSION: Enlarged heart with dilated calcified aorta.  Vascular congestion. Right-sided pleural effusion. Separate rounded opacity in the right lung base could be fluid tracking along fissure but underlying lesion is not excluded. Recommend short follow-up or CT Electronically Signed   By: Karen Kays M.D.   On: 05/17/2023 11:33    Assessment/Plan Apple Joby Seiffert is a 89 y.o. female with medical history significant for hypertension, paroxysmal SVT being admitted to the hospital with acute hypoxic respiratory failure and weakness likely due to community-acquired pneumonia and also found to be in new onset atrial fibrillation.  Sepsis due to pneumonia-meeting criteria with tachycardia, leukocytosis, and community-acquired pneumonia as a source.  No evidence of endorgan dysfunction. -Inpatient admission to progressive -Cardiac telemetry -Follow-up blood cultures -Note negative respiratory panel -Empiric IV azithromycin and IV Rocephin -Incentive spirometry and flutter valve  Community-acquired pneumonia-treating as above, could  consider chest CT to further elucidate rounded opacity in the right lung base, if patient fails to improve as expected  Acute hypoxic respiratory failure-continue supplemental oxygen, treat sepsis as above  CKD stage IIIb-renal function appears to be at baseline  Leukocytosis-due to suspected community-acquired pneumonia, as above  Atrial fibrillation-she has a prior history of SVT but no documented history of atrial fibrillation.  She does have prior history of possible sick euthyroid syndrome, and atrial fibrillation may also have been precipitated by her sepsis as above. -Check TSH -Maintain electrolytes K greater than 4, Mg greater than 2 -Monitor on telemetry -Would likely be a poor candidate for anticoagulation given her advanced age and weakness, discussed  with her son over the phone and he agrees with holding anticoagulation  Hypokalemia-replete potassium orally, recheck in the morning; check magnesium level  Chronic anemia-presumably due to her CKD, appears to be at baseline  Ambulatory dysfunction and weakness-likely acutely worsened due to her community-acquired pneumonia -PT/OT -SLP evaluation  DVT prophylaxis: Lovenox     Code Status: Do not attempt resuscitation (DNR) PRE-ARREST INTERVENTIONS DESIRED, confirmed with her son and POA over the phone at the time of admission  Consults called: None  Admission status: The appropriate patient status for this patient is INPATIENT. Inpatient status is judged to be reasonable and necessary in order to provide the required intensity of service to ensure the patient's safety. The patient's presenting symptoms, physical exam findings, and initial radiographic and laboratory data in the context of their chronic comorbidities is felt to place them at high risk for further clinical deterioration. Furthermore, it is not anticipated that the patient will be medically stable for discharge from the hospital within 2 midnights of admission.    I certify that at the point of admission it is my clinical judgment that the patient will require inpatient hospital care spanning beyond 2 midnights from the point of admission due to high intensity of service, high risk for further deterioration and high frequency of surveillance required  Time spent: 59 minutes  Tawsha Terrero Sharlette Dense MD [facility] Pager [phone]  If 7PM-7AM, please contact night-coverage [email or web address] Password [email or web address]  05/17/2023, 2:12 PM

## 2023-05-17 NOTE — ED Notes (Signed)
2x [place] IV's attempted, and unsuccessful.

## 2023-05-17 NOTE — ED Triage Notes (Signed)
The pt was bib EMS. The pts son called and reported back pain.The back pain is a chronic condition. Apparently, a week ago she had became ill and was in bed and has been bedbound ever since. The pt is ambulatory at baseline but weak today and unable to get up. The pt has not been tested for Covid. The social worker came out yesterday to get he placed in facility or order [facility]. Pt refused to go to facility because she does not want them to take all of her money.  VS B/P 130/70, O2 Sats 88%RA, placed on O2@@L /[place] 89 to 90%, increased to 3L/ sats 93-94%, CBG 157, T 97.2. No history of Afib. Heart was bouncing around. Does have a PMHx HTN, CHF, CKD.

## 2023-05-17 NOTE — Progress Notes (Signed)
Peripherally Inserted Central Catheter Placement  The IV Nurse has discussed with the patient and/or persons authorized to consent for the patient, the purpose of this procedure and the potential benefits and risks involved with this procedure.  The benefits include less needle sticks, lab draws from the catheter, and the patient may be discharged home with the catheter. Risks include, but not limited to, infection, bleeding, blood clot (thrombus formation), and puncture of an artery; nerve damage and irregular heartbeat and possibility to perform a PICC exchange if needed/ordered by physician.  Alternatives to this procedure were also discussed.  Bard Power PICC patient education guide, fact sheet on infection prevention and patient information card has been provided to patient /or left at bedside.    PICC Placement Documentation  PICC Double Lumen 05/17/23 Right Brachial 37 cm 0 cm (Active)  Indication for Insertion or Continuance of Line Poor Vasculature-patient has had multiple peripheral attempts or PIVs lasting less than 24 hours 05/17/23 2133  Exposed Catheter (cm) 0 cm 05/17/23 2133  Site Assessment Clean, Dry, Intact 05/17/23 2133  Lumen #1 Status Blood return noted;Flushed;Saline locked 05/17/23 2133  Lumen #2 Status Blood return noted;Flushed;Saline locked 05/17/23 2133  Dressing Type Transparent;Securing device 05/17/23 2133  Dressing Status Clean, Dry, Intact;Antimicrobial disc in place 05/17/23 2133  Line Care Connections checked and tightened 05/17/23 2133  Line Adjustment (NICU/IV Team Only) No 05/17/23 2133  Dressing Intervention New dressing 05/17/23 2133  Dressing Change Due 05/24/23 05/17/23 2133       Christeen Douglas 05/17/2023, 9:54 PM

## 2023-05-17 NOTE — ED Notes (Signed)
[facility] at bedside:  Cathlean [place] Office: [phone] Mobile: [phone] Fax: [phone]  Email: [email or web address]

## 2023-05-18 DIAGNOSIS — J189 Pneumonia, unspecified organism: Secondary | ICD-10-CM | POA: Diagnosis not present

## 2023-05-18 DIAGNOSIS — R7881 Bacteremia: Secondary | ICD-10-CM

## 2023-05-18 DIAGNOSIS — B953 Streptococcus pneumoniae as the cause of diseases classified elsewhere: Secondary | ICD-10-CM

## 2023-05-18 DIAGNOSIS — A419 Sepsis, unspecified organism: Secondary | ICD-10-CM | POA: Diagnosis not present

## 2023-05-18 LAB — BLOOD CULTURE ID PANEL (REFLEXED) - BCID2

## 2023-05-18 LAB — BASIC METABOLIC PANEL
Anion gap: 13 (ref 5–15)
BUN: 40 mg/dL — ABNORMAL HIGH (ref 8–23)
CO2: 24 mmol/L (ref 22–32)
Calcium: 8.8 mg/dL — ABNORMAL LOW (ref 8.9–10.3)
Chloride: 100 mmol/L (ref 98–111)
Creatinine, Ser: 1.5 mg/dL — ABNORMAL HIGH (ref 0.44–1.00)
GFR, Estimated: 33 mL/min — ABNORMAL LOW (ref >60)
Glucose, Bld: 107 mg/dL — ABNORMAL HIGH (ref 70–99)
Potassium: 3.3 mmol/L — ABNORMAL LOW (ref 3.5–5.1)
Sodium: 137 mmol/L (ref 135–145)

## 2023-05-18 LAB — CBC
HCT: 28.1 % — ABNORMAL LOW (ref 36.0–46.0)
Hemoglobin: 9.9 g/dL — ABNORMAL LOW (ref 12.0–15.0)
MCH: 35 pg — ABNORMAL HIGH (ref 26.0–34.0)
MCHC: 35.2 g/dL (ref 30.0–36.0)
MCV: 99.3 fL (ref 80.0–100.0)
Platelets: 272 10*3/uL (ref 150–400)
RBC: 2.83 MIL/uL — ABNORMAL LOW (ref 3.87–5.11)
RDW: 13 % (ref 11.5–15.5)
WBC: 24.3 10*3/uL — ABNORMAL HIGH (ref 4.0–10.5)
nRBC: 0 % (ref 0.0–0.2)

## 2023-05-18 MED ORDER — DEXTROSE-SODIUM CHLORIDE 5-0.9 % IV SOLN
INTRAVENOUS | Status: AC
Start: 2023-05-18 — End: 2023-05-19

## 2023-05-18 MED ORDER — POTASSIUM CHLORIDE CRYS ER 20 MEQ PO TBCR
60.0000 meq | EXTENDED_RELEASE_TABLET | Freq: Once | ORAL | Status: AC
  Administered 2023-05-18: 60 meq via ORAL
  Filled 2023-05-18: qty 3

## 2023-05-18 MED ORDER — LORAZEPAM 0.5 MG PO TABS
0.5000 mg | ORAL_TABLET | Freq: Every day | ORAL | Status: DC
  Administered 2023-05-18 – 2023-05-26 (×9): 0.5 mg via ORAL
  Filled 2023-05-18 (×9): qty 1

## 2023-05-18 MED ORDER — METOPROLOL SUCCINATE ER 50 MG PO TB24
25.0000 mg | ORAL_TABLET | Freq: Every day | ORAL | Status: DC
  Administered 2023-05-18 – 2023-05-27 (×10): 25 mg via ORAL
  Filled 2023-05-18 (×11): qty 1

## 2023-05-18 MED ORDER — HEPARIN SODIUM (PORCINE) 5000 UNIT/ML IJ SOLN
5000.0000 [IU] | Freq: Three times a day (TID) | INTRAMUSCULAR | Status: DC
  Administered 2023-05-18 – 2023-05-27 (×27): 5000 [IU] via SUBCUTANEOUS
  Filled 2023-05-18 (×26): qty 1

## 2023-05-18 MED ORDER — ADULT MULTIVITAMIN W/MINERALS CH
1.0000 | ORAL_TABLET | Freq: Every day | ORAL | Status: DC
  Administered 2023-05-18 – 2023-05-27 (×10): 1 via ORAL
  Filled 2023-05-18 (×10): qty 1

## 2023-05-18 MED ORDER — SERTRALINE HCL 25 MG PO TABS
25.0000 mg | ORAL_TABLET | Freq: Every day | ORAL | Status: DC
  Administered 2023-05-18 – 2023-05-27 (×10): 25 mg via ORAL
  Filled 2023-05-18 (×10): qty 1

## 2023-05-18 MED ORDER — SODIUM CHLORIDE 0.9 % IV SOLN
2.0000 g | INTRAVENOUS | Status: DC
  Administered 2023-05-18 – 2023-05-20 (×3): 2 g via INTRAVENOUS
  Filled 2023-05-18 (×3): qty 20

## 2023-05-18 MED ORDER — FERROUS SULFATE 325 (65 FE) MG PO TABS
325.0000 mg | ORAL_TABLET | Freq: Every day | ORAL | Status: DC
  Administered 2023-05-19 – 2023-05-27 (×9): 325 mg via ORAL
  Filled 2023-05-18 (×10): qty 1

## 2023-05-18 NOTE — Plan of Care (Signed)
  Problem: Clinical Measurements: Goal: Diagnostic test results will improve Outcome: Progressing Goal: Respiratory complications will improve Outcome: Progressing   Problem: Activity: Goal: Risk for activity intolerance will decrease Outcome: Progressing   Problem: Pain Management: Goal: General experience of comfort will improve Outcome: Progressing   Problem: Safety: Goal: Ability to remain free from injury will improve Outcome: Progressing   Problem: Nutrition: Goal: Adequate nutrition will be maintained Outcome: Not Progressing

## 2023-05-18 NOTE — Progress Notes (Signed)
PROGRESS NOTE  Alicia Schwartz ZOX:[id number] DOB: [birth year]   PCP: Linus Galas, NP  Patient is from: Home.  Lives alone.  DOA: 05/17/2023 LOS: 1  Chief complaints Chief Complaint  Patient presents with   Weakness   Cough     Brief Narrative / Interim history: 89 year old F with PMH of paroxysmal SVT, HTN and diminished hearing brought to ED after a social worker called EMS due to generalized weakness, acute on chronic low back pain and ambulatory dysfunction.  Reportedly saturating at 88% when EMS arrived.  Other vitals were okay.  She has leukocytosis to 27 with left shift.  CXR with small right-sided pleural effusion and possible consolidation versus mass.  Blood cultures obtained.  Patient was started on IV ceftriaxone and azithromycin, and admitted for severe sepsis due to community-acquired pneumonia.   The next day, blood culture with Streptococcus pneumonia in 1 out of 2 bottles.  Azithromycin discontinued.  Subjective: Seen and examined earlier this morning.  No major events overnight of this morning.  Patient has significantly diminished hearing and difficult to communicate.  Continues to endorse productive cough.  She denies chest pain.  Objective: Vitals:   05/17/23 2003 05/18/23 0037 05/18/23 0453 05/18/23 0812  BP: 131/75 (!) 133/59 (!) 152/80 135/68  Pulse:  79 97 (!) 104  Resp: 18 15 10 18   Temp: 98.2 F (36.8 C) (!) 97.5 F (36.4 C)  97.8 F (36.6 C)  TempSrc: Oral Oral    SpO2: 98% 96% 97% 95%  Weight: 47.3 kg     Height:        Examination:  GENERAL: Appears frail.  No apparent distress. HEENT: MMM.  Vision grossly intact.  Severely impaired hearing. NECK: Supple.  No apparent JVD.  RESP:  No IWOB.  Rhonchi bilaterally.  Productive cough during exam. CVS: Irregular rhythm.  Normal rate.  Heart sounds normal.  ABD/GI/GU: BS+. Abd soft, NTND.  MSK/EXT:  Moves extremities. No apparent deformity. No edema.  SKIN: no apparent skin lesion or  wound NEURO: Awake, alert and oriented appropriately.  No apparent focal neuro deficit. PSYCH: Calm. Normal affect.   Procedures:  None  Microbiology summarized: COVID-19, influenza and RSV PCR nonreactive Blood culture with Streptococcus pneumonia in 1 out of 2 bottles (both aerobic and anaerobic)  Assessment and plan: Severe sepsis due to pneumonia and bacteremia: meeting criteria with tachycardia, leukocytosis, respiratory failure, bacteremia and community-acquired pneumonia as a source.  Desaturated to 88% when EMS arrived and started on supplemental oxygen. -I agree with increasing IV ceftriaxone to 2 g daily. -Discontinue IV ceftriaxone -Pulmonary toilet with incentive telemetry, flutter valve -ID consult for Streptococcus bacteremia -Mucolytic's -SLP eval   Community-acquired pneumonia: CXR with small right pleural effusion and rounded opacity in RLL concerning for consolidation versus mass. -Treatment as above. -CT without contrast for better characterization of RLL mass  Streptococcus pneumonia bacteremia: Blood culture with Streptococcus pneumonia in 1 out of 2 bottles (both aerobic and anaerobic). -Antibiotic as above. -ID consult -Echocardiogram for bacteremia and A-fib   Acute hypoxic respiratory failure -Continue supplemental oxygen, treat sepsis as above -Wean oxygen as able   CKD-3B: Baseline Cr ranges from 1.2-1.3.  Not on nephrotoxic meds. Recent Labs    02/07/23 1541 05/17/23 1303 05/18/23 0158  BUN 25* 37* 40*  CREATININE 1.31* 1.17* 1.50*  -D5 NS at 75 cc an hour for 24 hours  New onset atrial fibrillation: Likely precipitated by sepsis.  TSH within normal. -Resume home Toprol-XL -Not on anticoagulation  due to fall risk.  Risk and benefit discussed by previous provider with patient's son -Optimize electrolytes. -Echocardiogram as above  Leukocytosis/bandemia-due to suspected community-acquired pneumonia, as above -Antibiotics as above -Continue  trending   Hypokalemia -Monitor replenish as appropriate   Anemia of renal disease: Relatively stable. Recent Labs    02/07/23 1541 05/17/23 1303 05/18/23 0158  HGB 11.9* 10.8* 9.9*  -Continue home ferrous sulfate   Ambulatory dysfunction and weakness: Patient lives alone.  Reportedly a Child psychotherapist is concerned about patient being at home alone -PT/OT eval -TOC consult   Body mass index is 21.06 kg/m.   DVT prophylaxis:  heparin injection 5,000 Units Start: 05/18/23 2200 SCDs Start: 05/17/23 1411  Code Status: DNR/DNI Family Communication: None at bedside Level of care: Progressive Status is: Inpatient Remains inpatient appropriate because: Severe sepsis, pneumonia, Streptococcus bacteremia   Final disposition: TBD Consultants:  Infectious disease  55 minutes with more than 50% spent in reviewing records, counseling patient/family and coordinating care.   Sch Meds:  Scheduled Meds:  Chlorhexidine Gluconate Cloth  6 each Topical Daily   heparin injection (subcutaneous)  5,000 Units Subcutaneous Q8H   sodium chloride flush  10-40 mL Intracatheter Q12H   Continuous Infusions:  cefTRIAXone (ROCEPHIN)  IV 2 g (05/18/23 1242)   dextrose 5 % and 0.9 % NaCl 75 mL/hr at 05/18/23 1239   PRN Meds:.acetaminophen **OR** acetaminophen, albuterol, guaiFENesin-dextromethorphan, lip balm, senna-docusate, sodium chloride, sodium chloride flush  Antimicrobials: Anti-infectives (From admission, onward)    Start     Dose/Rate Route Frequency Ordered Stop   05/18/23 1500  cefTRIAXone (ROCEPHIN) 1 g in sodium chloride 0.9 % 100 mL IVPB  Status:  Discontinued        1 g 200 mL/hr over 30 Minutes Intravenous Every 24 hours 05/17/23 1412 05/18/23 0542   05/18/23 1200  azithromycin (ZITHROMAX) 500 mg in sodium chloride 0.9 % 250 mL IVPB  Status:  Discontinued        500 mg 250 mL/hr over 60 Minutes Intravenous Every 24 hours 05/17/23 1412 05/18/23 1101   05/18/23 1000   cefTRIAXone (ROCEPHIN) 2 g in sodium chloride 0.9 % 100 mL IVPB        2 g 200 mL/hr over 30 Minutes Intravenous Every 24 hours 05/18/23 0544     05/17/23 1400  cefTRIAXone (ROCEPHIN) injection 1 g        1 g Intramuscular  Once 05/17/23 1346 05/17/23 1450   05/17/23 1300  cefTRIAXone (ROCEPHIN) 1 g in sodium chloride 0.9 % 100 mL IVPB  Status:  Discontinued        1 g 200 mL/hr over 30 Minutes Intravenous  Once 05/17/23 1257 05/17/23 2011   05/17/23 1300  azithromycin (ZITHROMAX) tablet 500 mg        500 mg Oral  Once 05/17/23 1257 05/17/23 1327        I have personally reviewed the following labs and images: CBC: Recent Labs  Lab 05/17/23 1303 05/18/23 0158  WBC 27.1* 24.3*  NEUTROABS 24.5*  --   HGB 10.8* 9.9*  HCT 30.1* 28.1*  MCV 98.0 99.3  PLT 279 272   BMP &GFR Recent Labs  Lab 05/17/23 1303 05/18/23 0158  NA 133* 137  K 3.0* 3.3*  CL 96* 100  CO2 25 24  GLUCOSE 125* 107*  BUN 37* 40*  CREATININE 1.17* 1.50*  CALCIUM 8.9 8.8*  MG 2.1  --    Estimated Creatinine Clearance: 17 mL/min (A) (by C-G formula  based on SCr of 1.5 mg/dL (H)). Liver & Pancreas: Recent Labs  Lab 05/17/23 1303  AST 19  ALT 16  ALKPHOS 127*  BILITOT 1.4*  PROT 6.5  ALBUMIN 2.8*   No results for input(s): "LIPASE", "AMYLASE" in the last 168 hours. No results for input(s): "AMMONIA" in the last 168 hours. Diabetic: No results for input(s): "HGBA1C" in the last 72 hours. No results for input(s): "GLUCAP" in the last 168 hours. Cardiac Enzymes: No results for input(s): "CKTOTAL", "CKMB", "CKMBINDEX", "TROPONINI" in the last 168 hours. No results for input(s): "PROBNP" in the last 8760 hours. Coagulation Profile: No results for input(s): "INR", "PROTIME" in the last 168 hours. Thyroid Function Tests: Recent Labs    05/17/23 2049  TSH 1.850   Lipid Profile: No results for input(s): "CHOL", "HDL", "LDLCALC", "TRIG", "CHOLHDL", "LDLDIRECT" in the last 72 hours. Anemia  Panel: No results for input(s): "VITAMINB12", "FOLATE", "FERRITIN", "TIBC", "IRON", "RETICCTPCT" in the last 72 hours. Urine analysis:    Component Value Date/Time   COLORURINE YELLOW 05/17/2023 1324   APPEARANCEUR CLEAR 05/17/2023 1324   LABSPEC 1.014 05/17/2023 1324   PHURINE 5.0 05/17/2023 1324   GLUCOSEU NEGATIVE 05/17/2023 1324   HGBUR NEGATIVE 05/17/2023 1324   BILIRUBINUR NEGATIVE 05/17/2023 1324   KETONESUR NEGATIVE 05/17/2023 1324   PROTEINUR NEGATIVE 05/17/2023 1324   NITRITE NEGATIVE 05/17/2023 1324   LEUKOCYTESUR NEGATIVE 05/17/2023 1324   Sepsis Labs: Invalid input(s): "PROCALCITONIN", "LACTICIDVEN"  Microbiology: Recent Results (from the past 240 hour(s))  Resp panel by RT-PCR (RSV, Flu A&B, Covid) Anterior Nasal Swab     Status: None   Collection Time: 05/17/23 10:40 AM   Specimen: Anterior Nasal Swab  Result Value Ref Range Status   SARS Coronavirus 2 by RT PCR NEGATIVE NEGATIVE Final    Comment: (NOTE) SARS-CoV-2 target nucleic acids are NOT DETECTED.  The SARS-CoV-2 RNA is generally detectable in upper respiratory specimens during the acute phase of infection. The lowest concentration of SARS-CoV-2 viral copies this assay can detect is 138 copies/mL. A negative result does not preclude SARS-Cov-2 infection and should not be used as the sole basis for treatment or other patient management decisions. A negative result may occur with  improper specimen collection/handling, submission of specimen other than nasopharyngeal swab, presence of viral mutation(s) within the areas targeted by this assay, and inadequate number of viral copies(<138 copies/mL). A negative result must be combined with clinical observations, patient history, and epidemiological information. The expected result is Negative.  Fact Sheet for Patients:  BloggerCourse.com  Fact Sheet for Healthcare Providers:  SeriousBroker.it  This test  is no t yet approved or cleared by the [place] FDA and  has been authorized for detection and/or diagnosis of SARS-CoV-2 by FDA under an Emergency Use Authorization (EUA). This EUA will remain  in effect (meaning this test can be used) for the duration of the COVID-19 declaration under Section 564(b)(1) of the Act, 21 U.S.C.section 360bbb-3(b)(1), unless the authorization is terminated  or revoked sooner.       Influenza A by PCR NEGATIVE NEGATIVE Final   Influenza B by PCR NEGATIVE NEGATIVE Final    Comment: (NOTE) The Xpert Xpress SARS-CoV-2/FLU/RSV plus assay is intended as an aid in the diagnosis of influenza from Nasopharyngeal swab specimens and should not be used as a sole basis for treatment. Nasal washings and aspirates are unacceptable for Xpert Xpress SARS-CoV-2/FLU/RSV testing.  Fact Sheet for Patients: BloggerCourse.com  Fact Sheet for Healthcare Providers: SeriousBroker.it  This test is  not yet approved or cleared by the [place] and has been authorized for detection and/or diagnosis of SARS-CoV-2 by FDA under an Emergency Use Authorization (EUA). This EUA will remain in effect (meaning this test can be used) for the duration of the COVID-19 declaration under Section 564(b)(1) of the Act, 21 U.S.C. section 360bbb-3(b)(1), unless the authorization is terminated or revoked.     Resp Syncytial Virus by PCR NEGATIVE NEGATIVE Final    Comment: (NOTE) Fact Sheet for Patients: BloggerCourse.com  Fact Sheet for Healthcare Providers: SeriousBroker.it  This test is not yet approved or cleared by the [place] FDA and has been authorized for detection and/or diagnosis of SARS-CoV-2 by FDA under an Emergency Use Authorization (EUA). This EUA will remain in effect (meaning this test can be used) for the duration of the COVID-19 declaration under Section  564(b)(1) of the Act, 21 U.S.C. section 360bbb-3(b)(1), unless the authorization is terminated or revoked.  Performed at [facility], [address] [place], [place] [address]   Blood Culture (routine x 2)     Status: None (Preliminary result)   Collection Time: 05/17/23 12:57 PM   Specimen: Neck; Blood  Result Value Ref Range Status   Specimen Description   Final    NECK LEFT Performed at [facility], [address], [place], [place] [address]    Special Requests   Final    BOTTLES DRAWN AEROBIC AND ANAEROBIC Blood Culture adequate volume Performed at [facility], [address] [place], [place] [address]    Culture  Setup Time   Final    GRAM POSITIVE COCCI IN BOTH AEROBIC AND ANAEROBIC BOTTLES Organism ID to follow CRITICAL RESULT CALLED TO, READ BACK BY AND VERIFIED WITH: L POINDEXTER,PHARMD@[id number]  05/18/23 MK Performed at [facility], [address] [place], [place] [address]    Culture GRAM POSITIVE COCCI  Final   Report Status PENDING  Incomplete  Blood Culture ID Panel (Reflexed)     Status: Abnormal   Collection Time: 05/17/23 12:57 PM  Result Value Ref Range Status   Enterococcus faecalis NOT DETECTED NOT DETECTED Final   Enterococcus Faecium NOT DETECTED NOT DETECTED Final   Listeria monocytogenes NOT DETECTED NOT DETECTED Final   Staphylococcus species NOT DETECTED NOT DETECTED Final   Staphylococcus aureus (BCID) NOT DETECTED NOT DETECTED Final   Staphylococcus epidermidis NOT DETECTED NOT DETECTED Final   Staphylococcus lugdunensis NOT DETECTED NOT DETECTED Final   Streptococcus species DETECTED (A) NOT DETECTED Final    Comment: CRITICAL RESULT CALLED TO, READ BACK BY AND VERIFIED WITH: LPOINDEXTER,PHARMD@0520  05/18/23 MK    Streptococcus agalactiae NOT DETECTED NOT DETECTED Final   Streptococcus pneumoniae DETECTED (A) NOT DETECTED Final    Comment: CRITICAL RESULT CALLED TO, READ BACK BY  AND VERIFIED WITH: L POINDEXTER,PHARMD@0520  05/18/23 MK    Streptococcus pyogenes NOT DETECTED NOT DETECTED Final   A.calcoaceticus-baumannii NOT DETECTED NOT DETECTED Final   Bacteroides fragilis NOT DETECTED NOT DETECTED Final   Enterobacterales NOT DETECTED NOT DETECTED Final   Enterobacter cloacae complex NOT DETECTED NOT DETECTED Final   Escherichia coli NOT DETECTED NOT DETECTED Final   Klebsiella aerogenes NOT DETECTED NOT DETECTED Final   Klebsiella oxytoca NOT DETECTED NOT DETECTED Final   Klebsiella pneumoniae NOT DETECTED NOT DETECTED Final   Proteus species NOT DETECTED NOT DETECTED Final   Salmonella species NOT DETECTED NOT DETECTED Final   Serratia marcescens NOT DETECTED NOT DETECTED Final   Haemophilus  influenzae NOT DETECTED NOT DETECTED Final   Neisseria meningitidis NOT DETECTED NOT DETECTED Final   Pseudomonas aeruginosa NOT DETECTED NOT DETECTED Final   Stenotrophomonas maltophilia NOT DETECTED NOT DETECTED Final   Candida albicans NOT DETECTED NOT DETECTED Final   Candida auris NOT DETECTED NOT DETECTED Final   Candida glabrata NOT DETECTED NOT DETECTED Final   Candida krusei NOT DETECTED NOT DETECTED Final   Candida parapsilosis NOT DETECTED NOT DETECTED Final   Candida tropicalis NOT DETECTED NOT DETECTED Final   Cryptococcus neoformans/gattii NOT DETECTED NOT DETECTED Final    Comment: Performed at [facility], [address], [place], [place] [address]  Blood Culture (routine x 2)     Status: None (Preliminary result)   Collection Time: 05/17/23  8:49 PM   Specimen: BLOOD  Result Value Ref Range Status   Specimen Description   Final    BLOOD LEFT ANTECUBITAL Performed at [facility], [address] [place], [place] [address]    Special Requests   Final    BOTTLES DRAWN AEROBIC AND ANAEROBIC Blood Culture adequate volume Performed at [facility], [address] [place], [place] [address]    Culture   Final    NO  GROWTH < 12 HOURS Performed at [facility], [address], [place], [place] [address]    Report Status PENDING  Incomplete    Radiology Studies: [place] EKG SITE RITE  Result Date: 05/17/2023 If Site Rite image not attached, placement could not be confirmed due to current cardiac rhythm.     Dreya Buhrman T. Rishawn Walck [facility]  If 7PM-7AM, please contact night-coverage [email or web address] 05/18/2023, 1:21 PM

## 2023-05-18 NOTE — Evaluation (Signed)
Physical Therapy Evaluation Patient Details Name: Alicia Schwartz MRN: [id number] DOB: Apr 20, 1931 Today's Date: 05/18/2023  History of Present Illness  Alicia Schwartz is a 89 y.o. female with medical history significant for hypertension, paroxysmal SVT being admitted to the hospital with acute hypoxic respiratory failure and weakness likely due to community-acquired pneumonia  Clinical Impression  Pt admitted with above diagnosis. PTA  pt reports that she lives with her son, however per admission notes she lives on her own with the assistance of some social work, she has been refusing to be moved to a facility (EMS was called to her home by social work). Pt reports that she was Ind/Mod Ind with RW for amb.  Patient will benefit from continued follow up therapy, <3 hours/day at d/c   Pt currently with functional limitations due to the deficits listed below (see PT Problem List). Pt will benefit from acute skilled PT to increase their independence and safety with mobility to allow discharge.           If plan is discharge home, recommend the following: A little help with walking and/or transfers;Help with stairs or ramp for entrance;A little help with bathing/dressing/bathroom;Assist for transportation;Assistance with cooking/housework;Direct supervision/assist for medications management   Can travel by private vehicle        Equipment Recommendations None recommended by PT  Recommendations for Other Services       Functional Status Assessment Patient has had a recent decline in their functional status and demonstrates the ability to make significant improvements in function in a reasonable and predictable amount of time.     Precautions / Restrictions Precautions Precautions: Fall Precaution Comments: O2 Restrictions Weight Bearing Restrictions: No      Mobility  Bed Mobility   Bed Mobility: Sit to Supine       Sit to supine: Min assist   General bed mobility  comments: required increased time and effort to complete; pt attempting to crawl  knees first into bed but unable, tacile cues to turn and sit EOB to  return to supine    Transfers Overall transfer level: Needs assistance Equipment used: Rolling walker (2 wheels) Transfers: Sit to/from Stand Sit to Stand: Min assist   Step pivot transfers: Min assist       General transfer comment: cues for hand placement and safety    Ambulation/Gait                  Stairs            Wheelchair Mobility     Tilt Bed    Modified Rankin (Stroke Patients Only)       Balance     Sitting balance-Leahy Scale: Fair     Standing balance support: During functional activity, Single extremity supported Standing balance-Leahy Scale: Fair Standing balance comment: reliant on HHA                             Pertinent Vitals/Pain Pain Assessment Pain Assessment: No/denies pain    Home Living Family/patient expects to be discharged to:: Private residence Living Arrangements: Alone Available Help at Discharge: Family;Available PRN/intermittently Type of Home: Mobile home Home Access: Stairs to enter Entrance Stairs-Rails: Right;Left Entrance Stairs-Number of Steps: 3   Home Layout: One level Home Equipment: Agricultural consultant (2 wheels) Additional Comments: pt repors that she lives with her son, however per admission notes, she lives on her own with the assistance of some  social work, she has been refusing to be moved to a facility. EMS was called to her home by social work,    Prior Function Prior Level of Function : Independent/Modified Independent;Patient poor historian/Family not available             Mobility Comments: pt reports that she uses a RW (then pushes it away when PT placed in front of her) ADLs Comments: pt reports that she bathes at the sink, is able to dress and toilet herself     Extremity/Trunk Assessment   Upper Extremity  Assessment Upper Extremity Assessment: Defer to OT evaluation;Generalized weakness    Lower Extremity Assessment Lower Extremity Assessment: Generalized weakness    Cervical / Trunk Assessment Cervical / Trunk Assessment: Kyphotic  Communication   Communication Communication: Other (comment);No apparent difficulties (very HOH) Cueing Techniques: Verbal cues;Gestural cues (d/t HOH)  Cognition Arousal: Alert Behavior During Therapy: WFL for tasks assessed/performed Overall Cognitive Status: No family/caregiver present to determine baseline cognitive functioning Area of Impairment: Memory, Safety/judgement, Awareness                         Safety/Judgement: Decreased awareness of safety, Decreased awareness of deficits     General Comments: pt very HOH and may not be answering questions accurately due to this        General Comments      Exercises     Assessment/Plan    PT Assessment Patient needs continued PT services  PT Problem List Decreased strength;Decreased activity tolerance;Decreased mobility;Decreased knowledge of precautions;Pain       PT Treatment Interventions DME instruction;Gait training;Functional mobility training;Therapeutic activities;Therapeutic exercise;Patient/family education;Stair training;Balance training    PT Goals (Current goals can be found in the Care Plan section)  Acute Rehab PT Goals PT Goal Formulation: With patient Time For Goal Achievement: 05/10/23 Potential to Achieve Goals: Fair    Frequency Min 1X/week     Co-evaluation               AM-PAC PT "6 Clicks" Mobility  Outcome Measure Help needed turning from your back to your side while in a flat bed without using bedrails?: A Little Help needed moving from lying on your back to sitting on the side of a flat bed without using bedrails?: A Little Help needed moving to and from a bed to a chair (including a wheelchair)?: A Little Help needed standing up from a  chair using your arms (e.g., wheelchair or bedside chair)?: A Little Help needed to walk in hospital room?: A Little Help needed climbing 3-5 steps with a railing? : A Little 6 Click Score: 18    End of Session Equipment Utilized During Treatment: Gait belt Activity Tolerance: Patient tolerated treatment well Patient left: with call bell/phone within reach;in bed;with bed alarm set;with nursing/sitter in room   PT Visit Diagnosis: Other abnormalities of gait and mobility (R26.89);Difficulty in walking, not elsewhere classified (R26.2)    Time: 2130-8657 PT Time Calculation (min) (ACUTE ONLY): 22 min   Charges:   PT Evaluation $PT Eval Low Complexity: 1 Low   PT General Charges $$ ACUTE PT VISIT: 1 Visit         Alaria Oconnor, PT  Acute Rehab Dept ([facility]) [phone]  05/18/2023   [facility] 05/18/2023, 1:19 PM

## 2023-05-18 NOTE — Evaluation (Signed)
Occupational Therapy Evaluation Patient Details Name: Alicia Schwartz MRN: [id number] DOB: May 13, 1931 Today's Date: 05/18/2023   History of Present Illness Alicia Schwartz is a 89 y.o. female with medical history significant for hypertension, paroxysmal SVT being admitted to the hospital with acute hypoxic respiratory failure and weakness likely due to community-acquired pneumonia   Clinical Impression   Pt presents with decline in function and safety with ADLs and ADL mobility with impaired strength, balance and endurance. PTA  pt reports that she lives with her son, however per admission notes she lives on her own with the assistance of some social work, she has been refusing to be moved to a facility(EMS was called to her home by social work). Pt reports that she was Ind with ADLs, bathes at sink and used a RW. Pt currently requires min A to sit EOB, mod A sit - stand, min SPT, min A with UB ADLs, max A with LB and toileting. Pt would benefit from acute OT services to maximize level of function and safety      If plan is discharge home, recommend the following: A lot of help with bathing/dressing/bathroom;A little help with walking and/or transfers;Assistance with cooking/housework;Direct supervision/assist for medications management;Assist for transportation    Functional Status Assessment  Patient has had a recent decline in their functional status and demonstrates the ability to make significant improvements in function in a reasonable and predictable amount of time.  Equipment Recommendations  Other (comment) (TBD at next venue of care)    Recommendations for Other Services       Precautions / Restrictions Precautions Precautions: Fall Precaution Comments: O2 Restrictions Weight Bearing Restrictions: No      Mobility Bed Mobility Overal bed mobility: Needs Assistance Bed Mobility: Supine to Sit     Supine to sit: Min assist, HOB elevated, Used rails     General  bed mobility comments: required increased time and effort to complete    Transfers Overall transfer level: Needs assistance Equipment used: Rolling walker (2 wheels) Transfers: Sit to/from Stand, Bed to chair/wheelchair/BSC Sit to Stand: Mod assist Stand pivot transfers: Min assist                Balance Overall balance assessment: Needs assistance Sitting-balance support: Bilateral upper extremity supported Sitting balance-Leahy Scale: Fair     Standing balance support: Bilateral upper extremity supported, During functional activity Standing balance-Leahy Scale: Poor                             ADL either performed or assessed with clinical judgement   ADL Overall ADL's : Needs assistance/impaired Eating/Feeding: Set up;Independent;Sitting   Grooming: Wash/dry hands;Wash/dry face;Brushing hair;Contact guard assist;Sitting   Upper Body Bathing: Minimal assistance   Lower Body Bathing: Maximal assistance   Upper Body Dressing : Minimal assistance   Lower Body Dressing: Maximal assistance   Toilet Transfer: Minimal assistance;Stand-pivot;BSC/3in1;Cueing for safety;Cueing for sequencing   Toileting- Clothing Manipulation and Hygiene: Maximal assistance;Sit to/from stand       Functional mobility during ADLs: Moderate assistance;Minimal assistance;Cueing for safety;Cueing for sequencing;Rolling walker (2 wheels)       Vision Ability to See in Adequate Light: 0 Adequate Patient Visual Report: No change from baseline       Perception         Praxis         Pertinent Vitals/Pain Pain Assessment Pain Assessment: No/denies pain     Extremity/Trunk Assessment  Upper Extremity Assessment Upper Extremity Assessment: Generalized weakness   Lower Extremity Assessment Lower Extremity Assessment: Defer to PT evaluation   Cervical / Trunk Assessment Cervical / Trunk Assessment: Kyphotic   Communication Communication Communication: Other  (comment);No apparent difficulties (Pt very HOH) Cueing Techniques: Verbal cues;Gestural cues;Tactile cues;Visual cues;Other (comments) (pt very HOH)   Cognition Arousal: Alert Behavior During Therapy: WFL for tasks assessed/performed Overall Cognitive Status: No family/caregiver present to determine baseline cognitive functioning Area of Impairment: Memory, Safety/judgement, Awareness                         Safety/Judgement: Decreased awareness of safety, Decreased awareness of deficits     General Comments: pt very HOH and may not be answering questions accurately due to this     General Comments       Exercises     Shoulder Instructions      Home Living Family/patient expects to be discharged to:: Private residence Living Arrangements: Alone Available Help at Discharge: Family;Available PRN/intermittently Type of Home: Mobile home Home Access: Stairs to enter Entrance Stairs-Number of Steps: 3 Entrance Stairs-Rails: Right;Left Home Layout: One level     Bathroom Shower/Tub: Producer, television/film/video: Standard     Home Equipment: Agricultural consultant (2 wheels)   Additional Comments: pt repors that she lives with her son, however per admission notes, she lives on her own with the assistance of some social work, she has been refusing to be moved to a facility. EMS was called to her home by social work,      Prior Functioning/Environment Prior Level of Function : Independent/Modified Independent;Patient poor historian/Family not available             Mobility Comments: pt reports that she uses a RW ADLs Comments: pt reports that she bathes at the sink, is able to dress and toilet herself        OT Problem List: Decreased strength;Impaired balance (sitting and/or standing);Decreased safety awareness;Decreased coordination;Decreased activity tolerance;Decreased knowledge of use of DME or AE      OT Treatment/Interventions: Self-care/ADL  training;DME and/or AE instruction;Therapeutic activities;Therapeutic exercise;Patient/family education    OT Goals(Current goals can be found in the care plan section) Acute Rehab OT Goals Patient Stated Goal: get stronger OT Goal Formulation: With patient Time For Goal Achievement: 06/01/23 Potential to Achieve Goals: Good ADL Goals Pt Will Perform Grooming: with supervision;with set-up;sitting Pt Will Perform Upper Body Bathing: with contact guard assist;with supervision;sitting Pt Will Perform Lower Body Bathing: with mod assist Pt Will Perform Upper Body Dressing: with contact guard assist;with supervision;sitting Pt Will Transfer to Toilet: with contact guard assist;ambulating;stand pivot transfer;bedside commode Pt Will Perform Toileting - Clothing Manipulation and hygiene: with mod assist;sit to/from stand  OT Frequency: Min 1X/week    Co-evaluation              AM-PAC OT "6 Clicks" Daily Activity     Outcome Measure Help from another person eating meals?: None Help from another person taking care of personal grooming?: A Little Help from another person toileting, which includes using toliet, bedpan, or urinal?: A Lot Help from another person bathing (including washing, rinsing, drying)?: A Lot Help from another person to put on and taking off regular upper body clothing?: A Little Help from another person to put on and taking off regular lower body clothing?: A Lot 6 Click Score: 16   End of Session Equipment Utilized During Treatment: Gait belt;Rolling walker (  2 wheels);Other (comment) [facility]) Nurse Communication: Mobility status  Activity Tolerance: Patient limited by fatigue Patient left: in chair;with call bell/phone within reach;with chair alarm set  OT Visit Diagnosis: Unsteadiness on feet (R26.81);Other abnormalities of gait and mobility (R26.89);History of falling (Z91.81);Muscle weakness (generalized) (M62.81)                Time: 7425-9563 OT Time  Calculation (min): 25 min Charges:  OT General Charges $OT Visit: 1 Visit OT Evaluation $OT Eval Moderate Complexity: 1 Mod OT Treatments $Therapeutic Activity: 8-22 mins    Galen Manila 05/18/2023, 11:25 AM

## 2023-05-18 NOTE — Progress Notes (Signed)
PHARMACY - PHYSICIAN COMMUNICATION CRITICAL VALUE ALERT - BLOOD CULTURE IDENTIFICATION (BCID)  Alicia Schwartz is an 89 y.o. female who presented to [facility] on 05/17/2023 with pneumonia.  Assessment:  BCID + Strep pneumoniae in one complete blood culture set (2nd set still pending)     Name of physician (or Provider) Contacted: A. Virgel Manifold, FNP  Current antibiotics: Ceftriaxone 1g IV q24h and Azithromycin for pneumonia  Changes to prescribed antibiotics recommended:  Increase Ceftriaxone to 2gm IV q24h  Results for orders placed or performed during the hospital encounter of 05/17/23  Blood Culture ID Panel (Reflexed) (Collected: 05/17/2023 12:57 PM)  Result Value Ref Range   Enterococcus faecalis NOT DETECTED NOT DETECTED   Enterococcus Faecium NOT DETECTED NOT DETECTED   Listeria monocytogenes NOT DETECTED NOT DETECTED   Staphylococcus species NOT DETECTED NOT DETECTED   Staphylococcus aureus (BCID) NOT DETECTED NOT DETECTED   Staphylococcus epidermidis NOT DETECTED NOT DETECTED   Staphylococcus lugdunensis NOT DETECTED NOT DETECTED   Streptococcus species DETECTED (A) NOT DETECTED   Streptococcus agalactiae NOT DETECTED NOT DETECTED   Streptococcus pneumoniae DETECTED (A) NOT DETECTED   Streptococcus pyogenes NOT DETECTED NOT DETECTED   A.calcoaceticus-baumannii NOT DETECTED NOT DETECTED   Bacteroides fragilis NOT DETECTED NOT DETECTED   Enterobacterales NOT DETECTED NOT DETECTED   Enterobacter cloacae complex NOT DETECTED NOT DETECTED   Escherichia coli NOT DETECTED NOT DETECTED   Klebsiella aerogenes NOT DETECTED NOT DETECTED   Klebsiella oxytoca NOT DETECTED NOT DETECTED   Klebsiella pneumoniae NOT DETECTED NOT DETECTED   Proteus species NOT DETECTED NOT DETECTED   Salmonella species NOT DETECTED NOT DETECTED   Serratia marcescens NOT DETECTED NOT DETECTED   Haemophilus influenzae NOT DETECTED NOT DETECTED   Neisseria meningitidis NOT DETECTED NOT DETECTED    Pseudomonas aeruginosa NOT DETECTED NOT DETECTED   Stenotrophomonas maltophilia NOT DETECTED NOT DETECTED   Candida albicans NOT DETECTED NOT DETECTED   Candida auris NOT DETECTED NOT DETECTED   Candida glabrata NOT DETECTED NOT DETECTED   Candida krusei NOT DETECTED NOT DETECTED   Candida parapsilosis NOT DETECTED NOT DETECTED   Candida tropicalis NOT DETECTED NOT DETECTED   Cryptococcus neoformans/gattii NOT DETECTED NOT DETECTED    Maryellen Pile, PharmD 05/18/2023  5:44 AM

## 2023-05-18 NOTE — Consult Note (Signed)
[facility]       Reason for Consult:pneumonia    Referring Physician: Dr. Alanda Slim  Principal Problem:   Sepsis due to pneumonia (HCC)    Chlorhexidine Gluconate Cloth  6 each Topical Daily   [START ON 05/19/2023] ferrous sulfate  325 mg Oral Q breakfast   heparin injection (subcutaneous)  5,000 Units Subcutaneous Q8H   LORazepam  0.5 mg Oral QHS   metoprolol succinate  25 mg Oral Daily   multivitamin with minerals  1 tablet Oral Daily   sertraline  25 mg Oral Daily   sodium chloride flush  10-40 mL Intracatheter Q12H    Recommendations: Continue ceftriaxone Agree with CT to evaluate pleural effusion   Assessment: She has hypoxia, cough, sob most c/w pulmonary infection.     HPI: Clyda Ludy is a 89 y.o. female with a history of paroxysmal SVT and lives alone came in with shortness of breath and hypoxia.  CXR with pleural effusion.  Started on empiric antibiotics and now with Strep pneumoniae in one blood culture set out of two.  She is hard of hearing so history limited.  She is currently on oxygen.  No headache, no neck pain.       Review of Systems:  Respiratory: positive for cough or sputum All other systems reviewed and are negative    Past Medical History:  Diagnosis Date   Fracture, subtrochanteric, right femur, closed (HCC) 02/11/2015   Hypertension    Paroxysmal supraventricular tachycardia (HCC) 02/12/2015    Social History   Tobacco Use   Smoking status: Never   Smokeless tobacco: Never  Substance Use Topics   Alcohol use: No    Family History  Problem Relation Age of Onset   Alzheimer's disease Mother    Prostate cancer Father     No Known Allergies  Physical Exam: Constitutional: in no apparent distress  Vitals:   05/18/23 0812 05/18/23 1329  BP: 135/68 130/71  Pulse: (!) 104 90  Resp: 18 16  Temp: 97.8 F (36.6 C) 98 F (36.7 C)  SpO2: 95% 97%   EYES: anicteric Respiratory: nomral respiratory  effort on nasal cannula Musculoskeletal: no edema  Lab Results  Component Value Date   WBC 24.3 (H) 05/18/2023   HGB 9.9 (L) 05/18/2023   HCT 28.1 (L) 05/18/2023   MCV 99.3 05/18/2023   PLT 272 05/18/2023    Lab Results  Component Value Date   CREATININE 1.50 (H) 05/18/2023   BUN 40 (H) 05/18/2023   NA 137 05/18/2023   K 3.3 (L) 05/18/2023   CL 100 05/18/2023   CO2 24 05/18/2023    Lab Results  Component Value Date   ALT 16 05/17/2023   AST 19 05/17/2023   ALKPHOS 127 (H) 05/17/2023     Microbiology: Recent Results (from the past 240 hour(s))  Resp panel by RT-PCR (RSV, Flu A&B, Covid) Anterior Nasal Swab     Status: None   Collection Time: 05/17/23 10:40 AM   Specimen: Anterior Nasal Swab  Result Value Ref Range Status   SARS Coronavirus 2 by RT PCR NEGATIVE NEGATIVE Final    Comment: (NOTE) SARS-CoV-2 target nucleic acids are NOT DETECTED.  The SARS-CoV-2 RNA is generally detectable in upper respiratory specimens during the acute phase of infection. The lowest concentration of SARS-CoV-2 viral copies this assay can detect is 138 copies/mL. A negative result does not preclude SARS-Cov-2 infection and should not be used as the sole basis  for treatment or other patient management decisions. A negative result may occur with  improper specimen collection/handling, submission of specimen other than nasopharyngeal swab, presence of viral mutation(s) within the areas targeted by this assay, and inadequate number of viral copies(<138 copies/mL). A negative result must be combined with clinical observations, patient history, and epidemiological information. The expected result is Negative.  Fact Sheet for Patients:  BloggerCourse.com  Fact Sheet for Healthcare Providers:  SeriousBroker.it  This test is no t yet approved or cleared by the [place] FDA and  has been authorized for detection and/or diagnosis of  SARS-CoV-2 by FDA under an Emergency Use Authorization (EUA). This EUA will remain  in effect (meaning this test can be used) for the duration of the COVID-19 declaration under Section 564(b)(1) of the Act, 21 U.S.C.section 360bbb-3(b)(1), unless the authorization is terminated  or revoked sooner.       Influenza A by PCR NEGATIVE NEGATIVE Final   Influenza B by PCR NEGATIVE NEGATIVE Final    Comment: (NOTE) The Xpert Xpress SARS-CoV-2/FLU/RSV plus assay is intended as an aid in the diagnosis of influenza from Nasopharyngeal swab specimens and should not be used as a sole basis for treatment. Nasal washings and aspirates are unacceptable for Xpert Xpress SARS-CoV-2/FLU/RSV testing.  Fact Sheet for Patients: BloggerCourse.com  Fact Sheet for Healthcare Providers: SeriousBroker.it  This test is not yet approved or cleared by the [place] FDA and has been authorized for detection and/or diagnosis of SARS-CoV-2 by FDA under an Emergency Use Authorization (EUA). This EUA will remain in effect (meaning this test can be used) for the duration of the COVID-19 declaration under Section 564(b)(1) of the Act, 21 U.S.C. section 360bbb-3(b)(1), unless the authorization is terminated or revoked.     Resp Syncytial Virus by PCR NEGATIVE NEGATIVE Final    Comment: (NOTE) Fact Sheet for Patients: BloggerCourse.com  Fact Sheet for Healthcare Providers: SeriousBroker.it  This test is not yet approved or cleared by the [place] FDA and has been authorized for detection and/or diagnosis of SARS-CoV-2 by FDA under an Emergency Use Authorization (EUA). This EUA will remain in effect (meaning this test can be used) for the duration of the COVID-19 declaration under Section 564(b)(1) of the Act, 21 U.S.C. section 360bbb-3(b)(1), unless the authorization is terminated  or revoked.  Performed at [facility], [address], [place], [place] [address]   Blood Culture (routine x 2)     Status: None (Preliminary result)   Collection Time: 05/17/23 12:57 PM   Specimen: Neck; Blood  Result Value Ref Range Status   Specimen Description   Final    NECK LEFT Performed at [facility], [address] [place], [place] [address]    Special Requests   Final    BOTTLES DRAWN AEROBIC AND ANAEROBIC Blood Culture adequate volume Performed at [facility], [address], [place], [place] [address]    Culture  Setup Time   Final    GRAM POSITIVE COCCI IN BOTH AEROBIC AND ANAEROBIC BOTTLES Organism ID to follow CRITICAL RESULT CALLED TO, READ BACK BY AND VERIFIED WITH: L POINDEXTER,[email or web address]  05/18/23 MK Performed at [facility], [address], [place], [place] [address]    Culture [facility] POSITIVE COCCI  Final   Report Status PENDING  Incomplete  Blood Culture ID Panel (Reflexed)     Status: Abnormal   Collection Time: 05/17/23 12:57 PM  Result Value Ref Range Status   Enterococcus faecalis  NOT DETECTED NOT DETECTED Final   Enterococcus Faecium NOT DETECTED NOT DETECTED Final   Listeria monocytogenes NOT DETECTED NOT DETECTED Final   Staphylococcus species NOT DETECTED NOT DETECTED Final   Staphylococcus aureus (BCID) NOT DETECTED NOT DETECTED Final   Staphylococcus epidermidis NOT DETECTED NOT DETECTED Final   Staphylococcus lugdunensis NOT DETECTED NOT DETECTED Final   Streptococcus species DETECTED (A) NOT DETECTED Final    Comment: CRITICAL RESULT CALLED TO, READ BACK BY AND VERIFIED WITH: LPOINDEXTER,PHARMD@0520  05/18/23 MK    Streptococcus agalactiae NOT DETECTED NOT DETECTED Final   Streptococcus pneumoniae DETECTED (A) NOT DETECTED Final    Comment: CRITICAL RESULT CALLED TO, READ BACK BY AND VERIFIED WITH: L POINDEXTER,PHARMD@0520  05/18/23 MK    Streptococcus pyogenes NOT DETECTED  NOT DETECTED Final   A.calcoaceticus-baumannii NOT DETECTED NOT DETECTED Final   Bacteroides fragilis NOT DETECTED NOT DETECTED Final   Enterobacterales NOT DETECTED NOT DETECTED Final   Enterobacter cloacae complex NOT DETECTED NOT DETECTED Final   Escherichia coli NOT DETECTED NOT DETECTED Final   Klebsiella aerogenes NOT DETECTED NOT DETECTED Final   Klebsiella oxytoca NOT DETECTED NOT DETECTED Final   Klebsiella pneumoniae NOT DETECTED NOT DETECTED Final   Proteus species NOT DETECTED NOT DETECTED Final   Salmonella species NOT DETECTED NOT DETECTED Final   Serratia marcescens NOT DETECTED NOT DETECTED Final   Haemophilus influenzae NOT DETECTED NOT DETECTED Final   Neisseria meningitidis NOT DETECTED NOT DETECTED Final   Pseudomonas aeruginosa NOT DETECTED NOT DETECTED Final   Stenotrophomonas maltophilia NOT DETECTED NOT DETECTED Final   Candida albicans NOT DETECTED NOT DETECTED Final   Candida auris NOT DETECTED NOT DETECTED Final   Candida glabrata NOT DETECTED NOT DETECTED Final   Candida krusei NOT DETECTED NOT DETECTED Final   Candida parapsilosis NOT DETECTED NOT DETECTED Final   Candida tropicalis NOT DETECTED NOT DETECTED Final   Cryptococcus neoformans/gattii NOT DETECTED NOT DETECTED Final    Comment: Performed at [facility], [address] [place], [place] [address]  Blood Culture (routine x 2)     Status: None (Preliminary result)   Collection Time: 05/17/23  8:49 PM   Specimen: BLOOD  Result Value Ref Range Status   Specimen Description   Final    BLOOD LEFT ANTECUBITAL Performed at [facility], [address] [place], [place] [address]    Special Requests   Final    BOTTLES DRAWN AEROBIC AND ANAEROBIC Blood Culture adequate volume Performed at [facility], [address], [place], [place] [address]    Culture   Final    NO GROWTH < 12 HOURS Performed at [facility], [address] [place], [place] [address]     Report Status PENDING  Incomplete    Gardiner Barefoot, MD [facility] [email or web address] 05/18/2023, 3:42 PM

## 2023-05-18 NOTE — Progress Notes (Signed)
Clinical/Bedside Swallow Evaluation Patient Details  Name: Alicia Schwartz MRN: [id number] Date of Birth: October 31, 1930  Today's Date: 05/18/2023 Time: SLP Start Time (ACUTE ONLY): (P) 1235 SLP Stop Time (ACUTE ONLY): (P) 1305 SLP Time Calculation (min) (ACUTE ONLY): (P) 30 min  Past Medical History:  Past Medical History:  Diagnosis Date   Fracture, subtrochanteric, right femur, closed (HCC) 02/11/2015   Hypertension    Paroxysmal supraventricular tachycardia (HCC) 02/12/2015   Past Surgical History:  Past Surgical History:  Procedure Laterality Date   ABDOMINAL HYSTERECTOMY     INTRAMEDULLARY (IM) NAIL INTERTROCHANTERIC Right 02/12/2015   Procedure: INTRAMEDULLARY (IM) NAIL INTERTROCHANTRIC;  Surgeon: Teryl Lucy, MD;  Location: [facility] OR;  Service: Orthopedics;  Laterality: Right;   IR VERTEBROPLASTY CERV/THOR BX INC UNI/BIL INC/INJECT/IMAGING  07/08/2019   HPI:  (P) 89 yo female adm to [facility] with weakness/respiratory failure.  Pt PMH CAP, back pain, femur fx s/p 2016, chronic back pain, bed ridden, T12 FX h/o SVT.  She resides with her son.  Concerns for CXR showing right lower lobe consolidation or mass, and small right pleural effusion.  Urinalysis negative.  Patient has previously been advised to go to SNF but declined due to concern for money.  Swallow eval ordered    Assessment / Plan / Recommendation  Clinical Impression  (P) Patient alert with appearance of normal work of breathing at this time.  She is very hard of hearing and thus communication is limited.  Patient denies any issues with swallowing prior to admission.  No focal cranial nerve deficits apparent and she easily passed 3 ounce Yale water challenge.  Patient able to self-feed single graham cracker, few bites of applesauce with potassium given by RN and plain bites of applesauce as well as approximately 8 ounces of liquids total.  Patient politely declined to consume any more p.o. intake after a small snack.  Anticipate  her intake will be compromised . Cough noted occasionaly with po and pt expectorated viscous secretions that were yellow-tinged but were not consistent with the color of what she had consumed.  Occasional eructation noted and patient reports history of reflux therefore recommend reflux precautions-which may need her larger aspiration risk given her cough kyphosis.  Will follow-up briefly to determine if instrumental swallow evaluation indicated and advised patient to follow general precautions.  Recommend medications be given with applesauce for maximal comfort.  Be assisted to set up oral suction for use as needed. SLP Visit Diagnosis: (P) Dysphagia, unspecified (R13.10)    Aspiration Risk  (P) Mild aspiration risk    Diet Recommendation (P) Regular;Dysphagia 3 (Mech soft);Thin liquid    Liquid Administration via: (P) Cup;Straw Medication Administration: (P) Whole meds with puree (Crush if indicated and not contraindicated) Supervision: (P) Patient able to self feed;Comment;Intermittent supervision to cue for compensatory strategies (Set up assist, intermittent supervision) Compensations: (P) Slow rate;Small sips/bites Postural Changes: (P) Seated upright at 90 degrees;Remain upright for at least 30 minutes after po intake    Other  Recommendations Oral Care Recommendations: (P) Oral care BID Caregiver Recommendations: (P) Have oral suction available    Recommendations for follow up therapy are one component of a multi-disciplinary discharge planning process, led by the attending physician.  Recommendations may be updated based on patient status, additional functional criteria and insurance authorization.  Follow up Recommendations (P) Follow physician's recommendations for discharge plan and follow up therapies      Assistance Recommended at Discharge    Functional Status Assessment (P) Patient has had  a recent decline in their functional status and demonstrates the ability to make  significant improvements in function in a reasonable and predictable amount of time.  Frequency and Duration (P) min 1 x/week  (P) 1 week       Prognosis Prognosis for improved oropharyngeal function: (P) Fair Barriers to Reach Goals: (P)  (Patient's age)      Swallow Study   General Date of Onset: 05/18/23 HPI: (P) 89 yo female adm to [facility] with weakness/respiratory failure.  Pt PMH CAP, back pain, femur fx s/p 2016, chronic back pain, bed ridden, T12 FX h/o SVT.  She resides with her son.  Concerns for CXR showing right lower lobe consolidation or mass, and small right pleural effusion.  Urinalysis negative.  Patient has previously been advised to go to SNF but declined due to concern for money.  Swallow eval ordered Type of Study: Bedside Swallow Evaluation Previous Swallow Assessment: (P) none in chart Diet Prior to this Study: (P) Regular;Thin liquids (Level 0) Temperature Spikes Noted: (P) No Respiratory Status: (P) Nasal cannula (3 liters) History of Recent Intubation: (P) No Behavior/Cognition: (P) Alert;Cooperative (Very hard of hearing) Oral Cavity Assessment: (P) Within Functional Limits Oral Care Completed by SLP: (P) No Oral Cavity - Dentition: (P) Poor condition;Missing dentition Self-Feeding Abilities: (P) Able to feed self Patient Positioning: (P) Upright in bed Baseline Vocal Quality: (P) Normal Volitional Cough: (P) Cognitively unable to elicit Volitional Swallow: (P) Unable to elicit    Oral/Motor/Sensory Function Overall Oral Motor/Sensory Function: (P) Generalized oral weakness   Ice Chips Ice chips: (P) Not tested   Thin Liquid Thin Liquid: (P) Within functional limits Other Comments: (P) Occasional cough noted but patient passed 3 ounce Yale water challenge so therefore suspect it is more related to pneumonia.  Expectorates were yellow-tinged and did not conclude anything patient had consumed    Nectar Thick Nectar Thick Liquid: (P) Not tested   Honey Thick  Honey Thick Liquid: (P) Not tested   Puree Puree: (P) Within functional limits Presentation: (P) Self Fed;Spoon   Solid     Solid: (P) Within functional limits Presentation: (P) Self Fed      Chales Abrahams 05/18/2023,3:41 PM

## 2023-05-19 ENCOUNTER — Inpatient Hospital Stay (HOSPITAL_COMMUNITY): Payer: Medicare HMO

## 2023-05-19 DIAGNOSIS — B953 Streptococcus pneumoniae as the cause of diseases classified elsewhere: Secondary | ICD-10-CM | POA: Diagnosis not present

## 2023-05-19 DIAGNOSIS — I4891 Unspecified atrial fibrillation: Secondary | ICD-10-CM | POA: Diagnosis not present

## 2023-05-19 DIAGNOSIS — J189 Pneumonia, unspecified organism: Secondary | ICD-10-CM | POA: Diagnosis not present

## 2023-05-19 DIAGNOSIS — R7881 Bacteremia: Secondary | ICD-10-CM | POA: Diagnosis not present

## 2023-05-19 LAB — CBC WITH DIFFERENTIAL/PLATELET
Abs Immature Granulocytes: 0.63 10*3/uL — ABNORMAL HIGH (ref 0.00–0.07)
Basophils Absolute: 0.1 10*3/uL (ref 0.0–0.1)
Basophils Relative: 0 %
Eosinophils Absolute: 0.1 10*3/uL (ref 0.0–0.5)
Eosinophils Relative: 1 %
HCT: 28.5 % — ABNORMAL LOW (ref 36.0–46.0)
Hemoglobin: 9.8 g/dL — ABNORMAL LOW (ref 12.0–15.0)
Immature Granulocytes: 4 %
Lymphocytes Relative: 5 %
Lymphs Abs: 0.9 10*3/uL (ref 0.7–4.0)
MCH: 34.5 pg — ABNORMAL HIGH (ref 26.0–34.0)
MCHC: 34.4 g/dL (ref 30.0–36.0)
MCV: 100.4 fL — ABNORMAL HIGH (ref 80.0–100.0)
Monocytes Absolute: 1.1 10*3/uL — ABNORMAL HIGH (ref 0.1–1.0)
Monocytes Relative: 6 %
Neutro Abs: 15.4 10*3/uL — ABNORMAL HIGH (ref 1.7–7.7)
Neutrophils Relative %: 84 %
Platelets: 290 10*3/uL (ref 150–400)
RBC: 2.84 MIL/uL — ABNORMAL LOW (ref 3.87–5.11)
RDW: 13.2 % (ref 11.5–15.5)
WBC: 18.2 10*3/uL — ABNORMAL HIGH (ref 4.0–10.5)
nRBC: 0 % (ref 0.0–0.2)

## 2023-05-19 LAB — RENAL FUNCTION PANEL
Albumin: 2.4 g/dL — ABNORMAL LOW (ref 3.5–5.0)
Anion gap: 10 (ref 5–15)
BUN: 31 mg/dL — ABNORMAL HIGH (ref 8–23)
CO2: 21 mmol/L — ABNORMAL LOW (ref 22–32)
Calcium: 8.4 mg/dL — ABNORMAL LOW (ref 8.9–10.3)
Chloride: 104 mmol/L (ref 98–111)
Creatinine, Ser: 1.12 mg/dL — ABNORMAL HIGH (ref 0.44–1.00)
GFR, Estimated: 47 mL/min — ABNORMAL LOW (ref >60)
Glucose, Bld: 135 mg/dL — ABNORMAL HIGH (ref 70–99)
Phosphorus: 1.8 mg/dL — ABNORMAL LOW (ref 2.5–4.6)
Potassium: 4.4 mmol/L (ref 3.5–5.1)
Sodium: 135 mmol/L (ref 135–145)

## 2023-05-19 LAB — ECHOCARDIOGRAM COMPLETE
AR max vel: 1.69 cm2
AV Peak grad: 4.4 mm[Hg]
Ao pk vel: 1.05 m/s
Area-P 1/2: 4.49 cm2
Height: 59 in
MV M vel: 5.48 m/s
MV Peak grad: 120.1 mm[Hg]
P 1/2 time: 208 ms
S' Lateral: 2.6 cm
Weight: 1668.44 [oz_av]

## 2023-05-19 LAB — GLUCOSE, CAPILLARY: Glucose-Capillary: 150 mg/dL — ABNORMAL HIGH (ref 70–99)

## 2023-05-19 LAB — MAGNESIUM: Magnesium: 2.1 mg/dL (ref 1.7–2.4)

## 2023-05-19 MED ORDER — SODIUM PHOSPHATES 45 MMOLE/15ML IV SOLN
15.0000 mmol | Freq: Once | INTRAVENOUS | Status: AC
  Administered 2023-05-19: 15 mmol via INTRAVENOUS
  Filled 2023-05-19: qty 5

## 2023-05-19 NOTE — Plan of Care (Signed)
  Problem: Education: Goal: Knowledge of General Education information will improve Description: Including pain rating scale, medication(s)/side effects and non-pharmacologic comfort measures Outcome: Progressing   Problem: Clinical Measurements: Goal: Diagnostic test results will improve Outcome: Progressing Goal: Respiratory complications will improve Outcome: Progressing   Problem: Activity: Goal: Risk for activity intolerance will decrease Outcome: Progressing   Problem: Safety: Goal: Ability to remain free from injury will improve Outcome: Progressing   

## 2023-05-19 NOTE — Progress Notes (Signed)
Echocardiogram 2D Echocardiogram has been performed.  Lucendia Herrlich 05/19/2023, 2:07 PM

## 2023-05-19 NOTE — Progress Notes (Signed)
PROGRESS NOTE  Alicia Schwartz AOZ:[id number] DOB: May 01, 1931   PCP: Linus Galas, NP  Patient is from: Home.  Lives alone.  DOA: 05/17/2023 LOS: 2  Chief complaints Chief Complaint  Patient presents with   Weakness   Cough     Brief Narrative / Interim history: 89 year old F with PMH of paroxysmal SVT, HTN and diminished hearing brought to ED after a social worker called EMS due to generalized weakness, acute on chronic low back pain and ambulatory dysfunction.  Reportedly saturating at 88% when EMS arrived.  Other vitals were okay.  She has leukocytosis to 27 with left shift.  CXR with small right-sided pleural effusion and possible consolidation versus mass.  Blood cultures obtained.  Patient was started on IV ceftriaxone and azithromycin, and admitted for severe sepsis due to community-acquired pneumonia.   The next day, blood culture with Streptococcus pneumonia in 1 out of 2 bottles.  Azithromycin discontinued.  ID consulted.  Remains on IV ceftriaxone.  CT chest and echocardiogram ordered.  Subjective: Seen and examined earlier this morning.  No major events overnight of this morning.  Has no complaints but poor historian partly due to severely diminished hearing.  Does not appear to be in distress.  Hemodynamically stable.  Objective: Vitals:   05/18/23 2105 05/19/23 0509 05/19/23 0639 05/19/23 1208  BP: 136/67 (!) 141/72  138/68  Pulse: (!) 104 (!) 106  87  Resp:    18  Temp: 97.6 F (36.4 C) 97.7 F (36.5 C)  97.9 F (36.6 C)  TempSrc: Oral Oral  Oral  SpO2: 94% 96% 94% 97%  Weight:      Height:        Examination:  GENERAL: Appears frail.  No apparent distress. HEENT: MMM.  Vision grossly intact.  Severely impaired hearing. NECK: Supple.  No apparent JVD.  RESP:  No IWOB.  Rhonchi bilaterally.  CVS: Irregular rhythm.  Normal rate.  Heart sounds normal.  ABD/GI/GU: BS+. Abd soft, NTND.  MSK/EXT:  Moves extremities. No apparent deformity. No edema.  SKIN:  no apparent skin lesion or wound NEURO: Awake, alert and oriented appropriately.  No apparent focal neuro deficit. PSYCH: Calm. Normal affect.   Procedures:  None  Microbiology summarized: COVID-19, influenza and RSV PCR nonreactive Blood culture with Streptococcus pneumonia in 1 out of 2 bottles (both aerobic and anaerobic)  Assessment and plan: Severe sepsis due to pneumonia and bacteremia: meeting criteria with tachycardia, leukocytosis, respiratory failure, bacteremia and community-acquired pneumonia as a source.  Desaturated to 88% when EMS arrived and started on supplemental oxygen. -Appreciate input by ID. -Continue IV ceftriaxone 2 g daily -Pulmonary toilet with incentive telemetry, flutter valve -Mucolytic's -SLP recommended dysphagia 3 diet. -Aspiration precaution   Community-acquired pneumonia: CXR with small right pleural effusion and rounded opacity in RLL concerning for consolidation versus mass. -Treatment as above. -CT chest without contrast for better characterization of RLL mass  Streptococcus pneumonia bacteremia: Blood culture with Streptococcus pneumonia in 1 out of 2 bottles (both aerobic and anaerobic). -Appreciate input by ID -IV ceftriaxone as above -Echocardiogram for bacteremia and A-fib   Acute hypoxic respiratory failure -Wean oxygen as able -Encourage incentive spirometry and flutter valve -Treat pneumonia as above   CKD-3B: Baseline Cr ranges from 1.2-1.3.  She is also on losartan.  AKI resolved. Recent Labs    02/07/23 1541 05/17/23 1303 05/18/23 0158 05/19/23 0343  BUN 25* 37* 40* 31*  CREATININE 1.31* 1.17* 1.50* 1.12*  -Monitor off IV fluid  New  onset atrial fibrillation: Remains in A-fib.  Likely precipitated by sepsis.  TSH within normal. -New home Toprol-XL -Not on AC due to fall risk.  Risk and benefit discussed by previous provider with patient's son -Optimize electrolytes. -Echocardiogram as above  Leukocytosis/bandemia-due  to suspected community-acquired pneumonia, as above.  Improved. -Antibiotics as above -Continue trending   Hypokalemia -Monitor replenish as appropriate   Anemia of renal disease: Relatively stable. Recent Labs    02/07/23 1541 05/17/23 1303 05/18/23 0158 05/19/23 0343  HGB 11.9* 10.8* 9.9* 9.8*  -Continue home ferrous sulfate   Ambulatory dysfunction and weakness: Patient lives alone.  Reportedly a Child psychotherapist is concerned about patient being at home alone -PT/OT eval -TOC consult   Body mass index is 21.06 kg/m.   DVT prophylaxis:  heparin injection 5,000 Units Start: 05/18/23 2200 SCDs Start: 05/17/23 1411  Code Status: DNR/DNI Family Communication: Updated patient's son over the phone. Level of care: Telemetry Status is: Inpatient Remains inpatient appropriate because: Severe sepsis, pneumonia, Streptococcus bacteremia   Final disposition: TBD Consultants:  Infectious disease  55 minutes with more than 50% spent in reviewing records, counseling patient/family and coordinating care.   Sch Meds:  Scheduled Meds:  Chlorhexidine Gluconate Cloth  6 each Topical Daily   ferrous sulfate  325 mg Oral Q breakfast   heparin injection (subcutaneous)  5,000 Units Subcutaneous Q8H   LORazepam  0.5 mg Oral QHS   metoprolol succinate  25 mg Oral Daily   multivitamin with minerals  1 tablet Oral Daily   sertraline  25 mg Oral Daily   sodium chloride flush  10-40 mL Intracatheter Q12H   Continuous Infusions:  cefTRIAXone (ROCEPHIN)  IV 2 g (05/19/23 1104)   sodium phosphate 15 mmol in dextrose 5 % 250 mL infusion     PRN Meds:.acetaminophen **OR** acetaminophen, albuterol, guaiFENesin-dextromethorphan, lip balm, senna-docusate, sodium chloride, sodium chloride flush  Antimicrobials: Anti-infectives (From admission, onward)    Start     Dose/Rate Route Frequency Ordered Stop   05/18/23 1500  cefTRIAXone (ROCEPHIN) 1 g in sodium chloride 0.9 % 100 mL IVPB  Status:   Discontinued        1 g 200 mL/hr over 30 Minutes Intravenous Every 24 hours 05/17/23 1412 05/18/23 0542   05/18/23 1200  azithromycin (ZITHROMAX) 500 mg in sodium chloride 0.9 % 250 mL IVPB  Status:  Discontinued        500 mg 250 mL/hr over 60 Minutes Intravenous Every 24 hours 05/17/23 1412 05/18/23 1101   05/18/23 1000  cefTRIAXone (ROCEPHIN) 2 g in sodium chloride 0.9 % 100 mL IVPB        2 g 200 mL/hr over 30 Minutes Intravenous Every 24 hours 05/18/23 0544     05/17/23 1400  cefTRIAXone (ROCEPHIN) injection 1 g        1 g Intramuscular  Once 05/17/23 1346 05/17/23 1450   05/17/23 1300  cefTRIAXone (ROCEPHIN) 1 g in sodium chloride 0.9 % 100 mL IVPB  Status:  Discontinued        1 g 200 mL/hr over 30 Minutes Intravenous  Once 05/17/23 1257 05/17/23 2011   05/17/23 1300  azithromycin (ZITHROMAX) tablet 500 mg        500 mg Oral  Once 05/17/23 1257 05/17/23 1327        I have personally reviewed the following labs and images: CBC: Recent Labs  Lab 05/17/23 1303 05/18/23 0158 05/19/23 0343  WBC 27.1* 24.3* 18.2*  NEUTROABS 24.5*  --  15.4*  HGB 10.8* 9.9* 9.8*  HCT 30.1* 28.1* 28.5*  MCV 98.0 99.3 100.4*  PLT 279 272 290   BMP &GFR Recent Labs  Lab 05/17/23 1303 05/18/23 0158 05/19/23 0343  NA 133* 137 135  K 3.0* 3.3* 4.4  CL 96* 100 104  CO2 25 24 21*  GLUCOSE 125* 107* 135*  BUN 37* 40* 31*  CREATININE 1.17* 1.50* 1.12*  CALCIUM 8.9 8.8* 8.4*  MG 2.1  --  2.1  PHOS  --   --  1.8*   Estimated Creatinine Clearance: 22.8 mL/min (A) (by C-G formula based on SCr of 1.12 mg/dL (H)). Liver & Pancreas: Recent Labs  Lab 05/17/23 1303 05/19/23 0343  AST 19  --   ALT 16  --   ALKPHOS 127*  --   BILITOT 1.4*  --   PROT 6.5  --   ALBUMIN 2.8* 2.4*   No results for input(s): "LIPASE", "AMYLASE" in the last 168 hours. No results for input(s): "AMMONIA" in the last 168 hours. Diabetic: No results for input(s): "HGBA1C" in the last 72 hours. Recent Labs  Lab  05/19/23 1129  GLUCAP 150*   Cardiac Enzymes: No results for input(s): "CKTOTAL", "CKMB", "CKMBINDEX", "TROPONINI" in the last 168 hours. No results for input(s): "PROBNP" in the last 8760 hours. Coagulation Profile: No results for input(s): "INR", "PROTIME" in the last 168 hours. Thyroid Function Tests: Recent Labs    05/17/23 2049  TSH 1.850   Lipid Profile: No results for input(s): "CHOL", "HDL", "LDLCALC", "TRIG", "CHOLHDL", "LDLDIRECT" in the last 72 hours. Anemia Panel: No results for input(s): "VITAMINB12", "FOLATE", "FERRITIN", "TIBC", "IRON", "RETICCTPCT" in the last 72 hours. Urine analysis:    Component Value Date/Time   COLORURINE YELLOW 05/17/2023 1324   APPEARANCEUR CLEAR 05/17/2023 1324   LABSPEC 1.014 05/17/2023 1324   PHURINE 5.0 05/17/2023 1324   GLUCOSEU NEGATIVE 05/17/2023 1324   HGBUR NEGATIVE 05/17/2023 1324   BILIRUBINUR NEGATIVE 05/17/2023 1324   KETONESUR NEGATIVE 05/17/2023 1324   PROTEINUR NEGATIVE 05/17/2023 1324   NITRITE NEGATIVE 05/17/2023 1324   LEUKOCYTESUR NEGATIVE 05/17/2023 1324   Sepsis Labs: Invalid input(s): "PROCALCITONIN", "LACTICIDVEN"  Microbiology: Recent Results (from the past 240 hour(s))  Resp panel by RT-PCR (RSV, Flu A&B, Covid) Anterior Nasal Swab     Status: None   Collection Time: 05/17/23 10:40 AM   Specimen: Anterior Nasal Swab  Result Value Ref Range Status   SARS Coronavirus 2 by RT PCR NEGATIVE NEGATIVE Final    Comment: (NOTE) SARS-CoV-2 target nucleic acids are NOT DETECTED.  The SARS-CoV-2 RNA is generally detectable in upper respiratory specimens during the acute phase of infection. The lowest concentration of SARS-CoV-2 viral copies this assay can detect is 138 copies/mL. A negative result does not preclude SARS-Cov-2 infection and should not be used as the sole basis for treatment or other patient management decisions. A negative result may occur with  improper specimen collection/handling, submission  of specimen other than nasopharyngeal swab, presence of viral mutation(s) within the areas targeted by this assay, and inadequate number of viral copies(<138 copies/mL). A negative result must be combined with clinical observations, patient history, and epidemiological information. The expected result is Negative.  Fact Sheet for Patients:  BloggerCourse.com  Fact Sheet for Healthcare Providers:  SeriousBroker.it  This test is no t yet approved or cleared by the [place] FDA and  has been authorized for detection and/or diagnosis of SARS-CoV-2 by FDA under an Emergency Use Authorization (EUA). This EUA will  remain  in effect (meaning this test can be used) for the duration of the COVID-19 declaration under Section 564(b)(1) of the Act, 21 U.S.C.section 360bbb-3(b)(1), unless the authorization is terminated  or revoked sooner.       Influenza A by PCR NEGATIVE NEGATIVE Final   Influenza B by PCR NEGATIVE NEGATIVE Final    Comment: (NOTE) The Xpert Xpress SARS-CoV-2/FLU/RSV plus assay is intended as an aid in the diagnosis of influenza from Nasopharyngeal swab specimens and should not be used as a sole basis for treatment. Nasal washings and aspirates are unacceptable for Xpert Xpress SARS-CoV-2/FLU/RSV testing.  Fact Sheet for Patients: BloggerCourse.com  Fact Sheet for Healthcare Providers: SeriousBroker.it  This test is not yet approved or cleared by the [place] FDA and has been authorized for detection and/or diagnosis of SARS-CoV-2 by FDA under an Emergency Use Authorization (EUA). This EUA will remain in effect (meaning this test can be used) for the duration of the COVID-19 declaration under Section 564(b)(1) of the Act, 21 U.S.C. section 360bbb-3(b)(1), unless the authorization is terminated or revoked.     Resp Syncytial Virus by PCR NEGATIVE NEGATIVE  Final    Comment: (NOTE) Fact Sheet for Patients: BloggerCourse.com  Fact Sheet for Healthcare Providers: SeriousBroker.it  This test is not yet approved or cleared by the [place] FDA and has been authorized for detection and/or diagnosis of SARS-CoV-2 by FDA under an Emergency Use Authorization (EUA). This EUA will remain in effect (meaning this test can be used) for the duration of the COVID-19 declaration under Section 564(b)(1) of the Act, 21 U.S.C. section 360bbb-3(b)(1), unless the authorization is terminated or revoked.  Performed at [facility], [address], [place], [place] [address]   Blood Culture (routine x 2)     Status: Abnormal (Preliminary result)   Collection Time: 05/17/23 12:57 PM   Specimen: Neck; Blood  Result Value Ref Range Status   Specimen Description   Final    NECK LEFT Performed at [facility], [address] [place], [place] [address]    Special Requests   Final    BOTTLES DRAWN AEROBIC AND ANAEROBIC Blood Culture adequate volume Performed at [facility], [address] [place], [place] [address]    Culture  Setup Time   Final    GRAM POSITIVE COCCI IN BOTH AEROBIC AND ANAEROBIC BOTTLES Organism ID to follow CRITICAL RESULT CALLED TO, READ BACK BY AND VERIFIED WITH: L POINDEXTER,PHARMD@0520  05/18/23 MK    Culture (A)  Final    STREPTOCOCCUS PNEUMONIAE SUSCEPTIBILITIES TO FOLLOW Performed at [facility], [address], [place], [place] [address]    Report Status PENDING  Incomplete  Blood Culture ID Panel (Reflexed)     Status: Abnormal   Collection Time: 05/17/23 12:57 PM  Result Value Ref Range Status   Enterococcus faecalis NOT DETECTED NOT DETECTED Final   Enterococcus Faecium NOT DETECTED NOT DETECTED Final   Listeria monocytogenes NOT DETECTED NOT DETECTED Final   Staphylococcus species NOT DETECTED NOT  DETECTED Final   Staphylococcus aureus (BCID) NOT DETECTED NOT DETECTED Final   Staphylococcus epidermidis NOT DETECTED NOT DETECTED Final   Staphylococcus lugdunensis NOT DETECTED NOT DETECTED Final   Streptococcus species DETECTED (A) NOT DETECTED Final    Comment: CRITICAL RESULT CALLED TO, READ BACK BY AND VERIFIED WITH: LPOINDEXTER,PHARMD@0520  05/18/23 MK    Streptococcus agalactiae NOT DETECTED NOT DETECTED Final   Streptococcus pneumoniae DETECTED (A) NOT DETECTED Final  Comment: CRITICAL RESULT CALLED TO, READ BACK BY AND VERIFIED WITH: L POINDEXTER,PHARMD@0520  05/18/23 MK    Streptococcus pyogenes NOT DETECTED NOT DETECTED Final   A.calcoaceticus-baumannii NOT DETECTED NOT DETECTED Final   Bacteroides fragilis NOT DETECTED NOT DETECTED Final   Enterobacterales NOT DETECTED NOT DETECTED Final   Enterobacter cloacae complex NOT DETECTED NOT DETECTED Final   Escherichia coli NOT DETECTED NOT DETECTED Final   Klebsiella aerogenes NOT DETECTED NOT DETECTED Final   Klebsiella oxytoca NOT DETECTED NOT DETECTED Final   Klebsiella pneumoniae NOT DETECTED NOT DETECTED Final   Proteus species NOT DETECTED NOT DETECTED Final   Salmonella species NOT DETECTED NOT DETECTED Final   Serratia marcescens NOT DETECTED NOT DETECTED Final   Haemophilus influenzae NOT DETECTED NOT DETECTED Final   Neisseria meningitidis NOT DETECTED NOT DETECTED Final   Pseudomonas aeruginosa NOT DETECTED NOT DETECTED Final   Stenotrophomonas maltophilia NOT DETECTED NOT DETECTED Final   Candida albicans NOT DETECTED NOT DETECTED Final   Candida auris NOT DETECTED NOT DETECTED Final   Candida glabrata NOT DETECTED NOT DETECTED Final   Candida krusei NOT DETECTED NOT DETECTED Final   Candida parapsilosis NOT DETECTED NOT DETECTED Final   Candida tropicalis NOT DETECTED NOT DETECTED Final   Cryptococcus neoformans/gattii NOT DETECTED NOT DETECTED Final    Comment: Performed at [facility], [address], [place], [place] [address]  Blood Culture (routine x 2)     Status: None (Preliminary result)   Collection Time: 05/17/23  8:49 PM   Specimen: BLOOD  Result Value Ref Range Status   Specimen Description   Final    BLOOD LEFT ANTECUBITAL Performed at [facility], [address] [place], [place] [address]    Special Requests   Final    BOTTLES DRAWN AEROBIC AND ANAEROBIC Blood Culture adequate volume Performed at [facility], [address], [place], [place] [address]    Culture   Final    NO GROWTH 2 DAYS Performed at [facility], [address], [place], [place] [address]    Report Status PENDING  Incomplete    Radiology Studies: No results found.    Chonte Ricke T. Serah Nicoletti [facility]  If 7PM-7AM, please contact night-coverage [email or web address] 05/19/2023, 12:31 PM

## 2023-05-20 ENCOUNTER — Other Ambulatory Visit: Payer: Self-pay

## 2023-05-20 ENCOUNTER — Inpatient Hospital Stay (HOSPITAL_COMMUNITY): Payer: Medicare HMO

## 2023-05-20 DIAGNOSIS — J189 Pneumonia, unspecified organism: Secondary | ICD-10-CM | POA: Diagnosis not present

## 2023-05-20 DIAGNOSIS — A419 Sepsis, unspecified organism: Secondary | ICD-10-CM | POA: Diagnosis not present

## 2023-05-20 LAB — CBC
HCT: 27.5 % — ABNORMAL LOW (ref 36.0–46.0)
Hemoglobin: 9.2 g/dL — ABNORMAL LOW (ref 12.0–15.0)
MCH: 34.6 pg — ABNORMAL HIGH (ref 26.0–34.0)
MCHC: 33.5 g/dL (ref 30.0–36.0)
MCV: 103.4 fL — ABNORMAL HIGH (ref 80.0–100.0)
Platelets: 331 10*3/uL (ref 150–400)
RBC: 2.66 MIL/uL — ABNORMAL LOW (ref 3.87–5.11)
RDW: 13.9 % (ref 11.5–15.5)
WBC: 18.6 10*3/uL — ABNORMAL HIGH (ref 4.0–10.5)
nRBC: 0 % (ref 0.0–0.2)

## 2023-05-20 LAB — RENAL FUNCTION PANEL
Albumin: 2.2 g/dL — ABNORMAL LOW (ref 3.5–5.0)
Anion gap: 5 (ref 5–15)
BUN: 25 mg/dL — ABNORMAL HIGH (ref 8–23)
CO2: 24 mmol/L (ref 22–32)
Calcium: 8.1 mg/dL — ABNORMAL LOW (ref 8.9–10.3)
Chloride: 107 mmol/L (ref 98–111)
Creatinine, Ser: 0.99 mg/dL (ref 0.44–1.00)
GFR, Estimated: 54 mL/min — ABNORMAL LOW (ref >60)
Glucose, Bld: 96 mg/dL (ref 70–99)
Phosphorus: 4.1 mg/dL (ref 2.5–4.6)
Potassium: 4.3 mmol/L (ref 3.5–5.1)
Sodium: 136 mmol/L (ref 135–145)

## 2023-05-20 LAB — CULTURE, BLOOD (ROUTINE X 2): Special Requests: ADEQUATE

## 2023-05-20 LAB — MAGNESIUM: Magnesium: 2.2 mg/dL (ref 1.7–2.4)

## 2023-05-20 MED ORDER — AMOXICILLIN-POT CLAVULANATE 500-125 MG PO TABS
1.0000 | ORAL_TABLET | Freq: Two times a day (BID) | ORAL | Status: DC
  Administered 2023-05-21 – 2023-05-24 (×7): 1 via ORAL
  Filled 2023-05-20 (×8): qty 1

## 2023-05-20 NOTE — Progress Notes (Signed)
PT Cancellation Note  Patient Details Name: Alicia Schwartz MRN: [id number] DOB: 08/30/1930   Cancelled Treatment:    Reason Eval/Treat Not Completed: Patient at procedure or test/unavailable. Pt being transported for procedure 1003. PT to return if schedule allows and continue to follow acutely.   [name], PT Acute Rehab   Jacqualyn Posey 05/20/2023, 11:02 AM

## 2023-05-20 NOTE — Plan of Care (Signed)
  Problem: Safety: Goal: Ability to remain free from injury will improve Outcome: Progressing   Problem: Skin Integrity: Goal: Risk for impaired skin integrity will decrease Outcome: Progressing   Problem: Pain Management: Goal: General experience of comfort will improve Outcome: Progressing   Problem: Coping: Goal: Level of anxiety will decrease Outcome: Progressing   Problem: Nutrition: Goal: Adequate nutrition will be maintained Outcome: Progressing   Problem: Clinical Measurements: Goal: Respiratory complications will improve Outcome: Progressing   Problem: Clinical Measurements: Goal: Ability to maintain clinical measurements within normal limits will improve Outcome: Progressing   Problem: Education: Goal: Knowledge of General Education information will improve Description: Including pain rating scale, medication(s)/side effects and non-pharmacologic comfort measures Outcome: Progressing

## 2023-05-20 NOTE — Hospital Course (Addendum)
89 year old F with PMH of paroxysmal SVT, HTN and diminished hearing brought to ED after a social worker called EMS due to generalized weakness, acute on chronic low back pain and ambulatory dysfunction.  Reportedly saturating at 88% when EMS arrived.  Other vitals were okay.  She has leukocytosis to 27 with left shift.  CXR with small right-sided pleural effusion and possible consolidation versus mass.  Blood cultures obtained.  Patient was started on IV ceftriaxone and azithromycin, and admitted for severe sepsis due to community-acquired pneumonia.   The next day, blood culture with Streptococcus pneumonia in 1 out of 2 bottles.  Azithromycin discontinued.  ID consulted, advised CT to evaluate pleural effusion and continue ceftriaxone ID following closely, CT chest 10/28 showed: Moderate right and small left pleural effusion including loculated right-sided fissural component complete collapse of the right lower lobe and partial collapse RML and left lower lobe due to pleural effusion-PCCM consulted: Recommended chest tube placement given bacteremia for clearance of infusing and reduce the risk of trapped lung. Palliative care consulted as patient is high risk for decompensation.  Unable to do thoracentesis due to positioning. Seen by speech therapy cannot rule out aspiration clinically overall tolerating p.o. 10/31>> PCCM bedside paracentesis attempted and 50 cc of straw-colored fluid was drained and sent for analysis-Gram stain no organism, LDH elevated 455 protein 3.1, total nucleated cells 194 with 86% neutrophil. Further discussion with palliative care held, patient has high mortality/morbidity and prognosis guarded- patient's son aware and understanding.ID and pulmonary has signed off, advised Augmentin x 6 weeks from 05/24/2023, ID clinic follow-up on 12/2. Plans for discharge back to facility on 11/4 if remains stable

## 2023-05-20 NOTE — Progress Notes (Signed)
Speech Language Pathology Treatment: Dysphagia  Patient Details Name: Kenzee Schoenecker MRN: [id number] DOB: 1931-05-03 Today's Date: 05/20/2023 Time: 4098-1191 SLP Time Calculation (min) (ACUTE ONLY): 15 min  Assessment / Plan / Recommendation Clinical Impression  Patient seen by SLP for skilled treatment focused on dysphagia goals. Patient was sitting in recliner with eyes closed but awakened easily to voice. Her lunch tray was in the room but appeared untouched. When asked about this, patient reported  that is concerned that if she eats any solid foods "it will come back up". She has only been drinking liquids per her report. SLP primarily communicated to patient via writing as she is very HOH. She indicated that her reflux/GERD symptoms are new to this admission. She was willing to have some sips of water and juice. No immediate or delayed coughing observed. Patient did exhibit an intermittent congested sounding, sometimes productive cough which occurred prior to, during and after PO intake and did not appear related to PO intake. SLP is recommending continue with current diet, encourage PO's. She may need an objective swallow test secondary to her h/o GERD, PNA. SLP will continue to follow.    HPI HPI: 89 yo female adm to [facility] with weakness/respiratory failure.  Pt PMH CAP, back pain, femur fx s/p 2016, chronic back pain, bed ridden, T12 FX h/o SVT.  She resides with her son.  Concerns for CXR showing right lower lobe consolidation or mass, and small right pleural effusion.  Urinalysis negative.  Patient has previously been advised to go to SNF but declined due to concern for money.  Swallow eval ordered      SLP Plan  Continue with current plan of care      Recommendations for follow up therapy are one component of a multi-disciplinary discharge planning process, led by the attending physician.  Recommendations may be updated based on patient status, additional functional criteria and  insurance authorization.    Recommendations  Diet recommendations: Thin liquid;Regular Liquids provided via: Straw;Cup Medication Administration: Whole meds with puree Supervision: Patient able to self feed;Intermittent supervision to cue for compensatory strategies Compensations: Slow rate;Small sips/bites Postural Changes and/or Swallow Maneuvers: Seated upright 90 degrees;Upright 30-60 min after meal                  Oral care BID   Intermittent Supervision/Assistance Dysphagia, unspecified (R13.10)     Continue with current plan of care     Angela Nevin, MA, CCC-SLP Speech Therapy

## 2023-05-20 NOTE — Progress Notes (Signed)
[facility]  Date of Admission:  05/17/2023      Abx: 10/28-c amox-clav  10/25-28 ceftriaxone 10/25-26 azith   ASSESSMENT: 89 yo female admitted with sepsis and pna 10/25, complicated by right sided pleural effusion and strep pna bacteremia   Patient hard at hearing  Wbc improving Afebrile Stable  hemodynamics Appears deconditioned   Chest ct pending for further w/u pleural effusion  Given 3 days improvement and lack of sepsis will transition to amox-clav. Chest ct to determine what to do if anything at all for the pleural effusion and duration of treatment  PLAN: Transition iv abx to PO amox-clav F/u chest ct Discussed with primary team   I spent more than 50 minute reviewing data/chart, and coordinating care, providing direct face to face time providing counseling/discussing diagnostics/treatment plan with patient and treatment team   Principal Problem:   Sepsis due to pneumonia (HCC)   No Known Allergies  Scheduled Meds:  [START ON 05/21/2023] amoxicillin-clavulanate  1 tablet Oral BID   Chlorhexidine Gluconate Cloth  6 each Topical Daily   ferrous sulfate  325 mg Oral Q breakfast   heparin injection (subcutaneous)  5,000 Units Subcutaneous Q8H   LORazepam  0.5 mg Oral QHS   metoprolol succinate  25 mg Oral Daily   multivitamin with minerals  1 tablet Oral Daily   sertraline  25 mg Oral Daily   sodium chloride flush  10-40 mL Intracatheter Q12H   Continuous Infusions: PRN Meds:.acetaminophen **OR** acetaminophen, albuterol, guaiFENesin-dextromethorphan, lip balm, senna-docusate, sodium chloride, sodium chloride flush   SUBJECTIVE: Still coughing and feeling overall not well Afebrile Wbc down No diarrhea No n/v Appetite not the best    Review of Systems: ROS All other ROS was negative, except mentioned above     OBJECTIVE: Vitals:   05/19/23 1208 05/19/23 2043 05/20/23 0633 05/20/23 1203  BP:  138/68 135/69 (!) 142/88 (!) 147/82  Pulse: 87 95 95 (!) 105  Resp: 18 20 17 20   Temp: 97.9 F (36.6 C) (!) 97.5 F (36.4 C) 97.8 F (36.6 C) 98.1 F (36.7 C)  TempSrc: Oral Oral Oral Oral  SpO2: 97% 97% 95% 95%  Weight:      Height:       Body mass index is 21.06 kg/m.  Physical Exam  General/constitutional: elderly female in bed, hard at hearing, but verbal, alert, cooperative; noted wet sounding cough at times HEENT: Normocephalic, PER, Conj Clear, EOMI CV: rrr no mrg Lungs: coarse breath sound; normal respiratory effort; on 2 liters oxygen via [place] Abd: Soft, Nontender Ext: no edema Skin: No Rash Neuro: generalized weakness MSK: no peripheral joint swelling/tenderness/warmth     Lab Results Lab Results  Component Value Date   WBC 18.6 (H) 05/20/2023   HGB 9.2 (L) 05/20/2023   HCT 27.5 (L) 05/20/2023   MCV 103.4 (H) 05/20/2023   PLT 331 05/20/2023    Lab Results  Component Value Date   CREATININE 0.99 05/20/2023   BUN 25 (H) 05/20/2023   NA 136 05/20/2023   K 4.3 05/20/2023   CL 107 05/20/2023   CO2 24 05/20/2023    Lab Results  Component Value Date   ALT 16 05/17/2023   AST 19 05/17/2023   ALKPHOS 127 (H) 05/17/2023   BILITOT 1.4 (H) 05/17/2023      Microbiology: Recent Results (from the past 240 hour(s))  Resp panel by RT-PCR (RSV, Flu A&B, Covid) Anterior Nasal Swab  Status: None   Collection Time: 05/17/23 10:40 AM   Specimen: Anterior Nasal Swab  Result Value Ref Range Status   SARS Coronavirus 2 by RT PCR NEGATIVE NEGATIVE Final    Comment: (NOTE) SARS-CoV-2 target nucleic acids are NOT DETECTED.  The SARS-CoV-2 RNA is generally detectable in upper respiratory specimens during the acute phase of infection. The lowest concentration of SARS-CoV-2 viral copies this assay can detect is 138 copies/mL. A negative result does not preclude SARS-Cov-2 infection and should not be used as the sole basis for treatment or other patient management  decisions. A negative result may occur with  improper specimen collection/handling, submission of specimen other than nasopharyngeal swab, presence of viral mutation(s) within the areas targeted by this assay, and inadequate number of viral copies(<138 copies/mL). A negative result must be combined with clinical observations, patient history, and epidemiological information. The expected result is Negative.  Fact Sheet for Patients:  BloggerCourse.com  Fact Sheet for Healthcare Providers:  SeriousBroker.it  This test is no t yet approved or cleared by the [place] FDA and  has been authorized for detection and/or diagnosis of SARS-CoV-2 by FDA under an Emergency Use Authorization (EUA). This EUA will remain  in effect (meaning this test can be used) for the duration of the COVID-19 declaration under Section 564(b)(1) of the Act, 21 U.S.C.section 360bbb-3(b)(1), unless the authorization is terminated  or revoked sooner.       Influenza A by PCR NEGATIVE NEGATIVE Final   Influenza B by PCR NEGATIVE NEGATIVE Final    Comment: (NOTE) The Xpert Xpress SARS-CoV-2/FLU/RSV plus assay is intended as an aid in the diagnosis of influenza from Nasopharyngeal swab specimens and should not be used as a sole basis for treatment. Nasal washings and aspirates are unacceptable for Xpert Xpress SARS-CoV-2/FLU/RSV testing.  Fact Sheet for Patients: BloggerCourse.com  Fact Sheet for Healthcare Providers: SeriousBroker.it  This test is not yet approved or cleared by the [place] FDA and has been authorized for detection and/or diagnosis of SARS-CoV-2 by FDA under an Emergency Use Authorization (EUA). This EUA will remain in effect (meaning this test can be used) for the duration of the COVID-19 declaration under Section 564(b)(1) of the Act, 21 U.S.C. section 360bbb-3(b)(1), unless the  authorization is terminated or revoked.     Resp Syncytial Virus by PCR NEGATIVE NEGATIVE Final    Comment: (NOTE) Fact Sheet for Patients: BloggerCourse.com  Fact Sheet for Healthcare Providers: SeriousBroker.it  This test is not yet approved or cleared by the [place] FDA and has been authorized for detection and/or diagnosis of SARS-CoV-2 by FDA under an Emergency Use Authorization (EUA). This EUA will remain in effect (meaning this test can be used) for the duration of the COVID-19 declaration under Section 564(b)(1) of the Act, 21 U.S.C. section 360bbb-3(b)(1), unless the authorization is terminated or revoked.  Performed at [facility], [address], [place], [place] [address]   Blood Culture (routine x 2)     Status: Abnormal   Collection Time: 05/17/23 12:57 PM   Specimen: Neck; Blood  Result Value Ref Range Status   Specimen Description   Final    NECK LEFT Performed at [facility], [address], [place], [place] [address]    Special Requests   Final    BOTTLES DRAWN AEROBIC AND ANAEROBIC Blood Culture adequate volume Performed at [facility], [address], [place], [place] [address]    Culture  Setup Time  Final    GRAM POSITIVE COCCI IN BOTH AEROBIC AND ANAEROBIC BOTTLES CRITICAL RESULT CALLED TO, READ BACK BY AND VERIFIED WITH: L POINDEXTER,PHARMD@0520  05/18/23 MK Performed at [facility], [address] [place], [place] [address]    Culture STREPTOCOCCUS PNEUMONIAE (A)  Final   Report Status 05/20/2023 FINAL  Final   Organism ID, Bacteria STREPTOCOCCUS PNEUMONIAE  Final      Susceptibility   Streptococcus pneumoniae - MIC*    ERYTHROMYCIN >=8 RESISTANT Resistant     LEVOFLOXACIN 0.5 SENSITIVE Sensitive     VANCOMYCIN 0.5 SENSITIVE Sensitive     PENICILLIN (meningitis) 0.5 RESISTANT Resistant     PENO - penicillin 0.5       PENICILLIN (non-meningitis) 0.5 SENSITIVE Sensitive     PENICILLIN (oral) 0.5 INTERMEDIATE Intermediate     CEFTRIAXONE (non-meningitis) 1 SENSITIVE Sensitive     CEFTRIAXONE (meningitis) 1 INTERMEDIATE Intermediate     * STREPTOCOCCUS PNEUMONIAE  Blood Culture ID Panel (Reflexed)     Status: Abnormal   Collection Time: 05/17/23 12:57 PM  Result Value Ref Range Status   Enterococcus faecalis NOT DETECTED NOT DETECTED Final   Enterococcus Faecium NOT DETECTED NOT DETECTED Final   Listeria monocytogenes NOT DETECTED NOT DETECTED Final   Staphylococcus species NOT DETECTED NOT DETECTED Final   Staphylococcus aureus (BCID) NOT DETECTED NOT DETECTED Final   Staphylococcus epidermidis NOT DETECTED NOT DETECTED Final   Staphylococcus lugdunensis NOT DETECTED NOT DETECTED Final   Streptococcus species DETECTED (A) NOT DETECTED Final    Comment: CRITICAL RESULT CALLED TO, READ BACK BY AND VERIFIED WITH: LPOINDEXTER,PHARMD@0520  05/18/23 MK    Streptococcus agalactiae NOT DETECTED NOT DETECTED Final   Streptococcus pneumoniae DETECTED (A) NOT DETECTED Final    Comment: CRITICAL RESULT CALLED TO, READ BACK BY AND VERIFIED WITH: L POINDEXTER,PHARMD@0520  05/18/23 MK    Streptococcus pyogenes NOT DETECTED NOT DETECTED Final   A.calcoaceticus-baumannii NOT DETECTED NOT DETECTED Final   Bacteroides fragilis NOT DETECTED NOT DETECTED Final   Enterobacterales NOT DETECTED NOT DETECTED Final   Enterobacter cloacae complex NOT DETECTED NOT DETECTED Final   Escherichia coli NOT DETECTED NOT DETECTED Final   Klebsiella aerogenes NOT DETECTED NOT DETECTED Final   Klebsiella oxytoca NOT DETECTED NOT DETECTED Final   Klebsiella pneumoniae NOT DETECTED NOT DETECTED Final   Proteus species NOT DETECTED NOT DETECTED Final   Salmonella species NOT DETECTED NOT DETECTED Final   Serratia marcescens NOT DETECTED NOT DETECTED Final   Haemophilus influenzae NOT DETECTED NOT DETECTED Final   Neisseria meningitidis  NOT DETECTED NOT DETECTED Final   Pseudomonas aeruginosa NOT DETECTED NOT DETECTED Final   Stenotrophomonas maltophilia NOT DETECTED NOT DETECTED Final   Candida albicans NOT DETECTED NOT DETECTED Final   Candida auris NOT DETECTED NOT DETECTED Final   Candida glabrata NOT DETECTED NOT DETECTED Final   Candida krusei NOT DETECTED NOT DETECTED Final   Candida parapsilosis NOT DETECTED NOT DETECTED Final   Candida tropicalis NOT DETECTED NOT DETECTED Final   Cryptococcus neoformans/gattii NOT DETECTED NOT DETECTED Final    Comment: Performed at [facility], [address], [place], [place] [address]  Blood Culture (routine x 2)     Status: None (Preliminary result)   Collection Time: 05/17/23  8:49 PM   Specimen: BLOOD  Result Value Ref Range Status   Specimen Description   Final    BLOOD LEFT ANTECUBITAL Performed at [facility], [address] [place], [place] [address]  Special Requests   Final    BOTTLES DRAWN AEROBIC AND ANAEROBIC Blood Culture adequate volume Performed at [facility], [address], [place], [place] [address]    Culture   Final    NO GROWTH 3 DAYS Performed at [facility], [address], [place], [place] [address]    Report Status PENDING  Incomplete     Serology:   Imaging: If present, new imagings (plain films, ct scans, and mri) have been personally visualized and interpreted; radiology reports have been reviewed. Decision making incorporated into the Impression / Recommendations.  10/28 chest ct in progress  10/25 cxr Enlarged heart with dilated calcified aorta.  Vascular congestion.   Right-sided pleural effusion. Separate rounded opacity in the right lung base could be fluid tracking along fissure but underlying lesion is not excluded. Recommend short follow-up or CT   Raymondo Band, MD [facility] [phone] pager    05/20/2023, 12:29 PM

## 2023-05-20 NOTE — Progress Notes (Signed)
Physical Therapy Treatment Patient Details Name: Alicia Schwartz MRN: [id number] DOB: October 25, 1930 Today's Date: 05/20/2023   History of Present Illness Alicia Schwartz is a 89 y.o. female with medical history significant for hypertension, paroxysmal SVT being admitted to the hospital with acute hypoxic respiratory failure and weakness likely due to community-acquired pneumonia    PT Comments   Pt admitted with above diagnosis.  Pt currently with functional limitations due to the deficits listed below (see PT Problem List). Pt in bed when PT returned later in the day following chest CT with results pending. Pt agreeable to therapy intervention. Pt requires increased volume and demonstrated minimal verbal communication during session. Pt has productive and frequent cough. Pt required 2 L/min supplemental O2 and saturation 94-96%. Pt required mod A for supine to sit, mod A for sit to stand  and SPT with RW, cues encouragement and increased time. Pt voided bowels during transition to recliner. PT and Nurse Tech assisted pt with hygiene tasks. Pt left seated in recliner and all needs in place. Pt will benefit from acute skilled PT to increase their independence and safety with mobility to allow discharge.      If plan is discharge home, recommend the following: A little help with walking and/or transfers;Help with stairs or ramp for entrance;A little help with bathing/dressing/bathroom;Assist for transportation;Assistance with cooking/housework;Direct supervision/assist for medications management   Can travel by private vehicle        Equipment Recommendations  None recommended by PT    Recommendations for Other Services       Precautions / Restrictions Precautions Precautions: Fall Precaution Comments: O2 Restrictions Weight Bearing Restrictions: No     Mobility  Bed Mobility Overal bed mobility: Needs Assistance Bed Mobility: Sit to Supine     Supine to sit: HOB elevated, Used  rails, Mod assist     General bed mobility comments: required increased time and effort to complete and use of bed pad to scoot to EOB, pt demonstrated strong posteiror lean in sitting    Transfers Overall transfer level: Needs assistance Equipment used: Rolling walker (2 wheels) Transfers: Sit to/from Stand, Bed to chair/wheelchair/BSC Sit to Stand: Mod assist Stand pivot transfers: Mod assist         General transfer comment: cues for hand placement and safety with pt pull to stand and maintaining flexed posture, poor activity tolerance    Ambulation/Gait               General Gait Details: NT   Stairs             Wheelchair Mobility     Tilt Bed    Modified Rankin (Stroke Patients Only)       Balance Overall balance assessment: Needs assistance Sitting-balance support: Bilateral upper extremity supported, Feet supported Sitting balance-Leahy Scale: Poor     Standing balance support: During functional activity, Single extremity supported, Reliant on assistive device for balance Standing balance-Leahy Scale: Poor Standing balance comment: pt required min A to maintain balance pt standing tolerance is limited and pt initiated strong posterior push                            Cognition Arousal: Alert Behavior During Therapy: WFL for tasks assessed/performed Overall Cognitive Status: No family/caregiver present to determine baseline cognitive functioning Area of Impairment: Memory, Safety/judgement, Awareness  Safety/Judgement: Decreased awareness of safety, Decreased awareness of deficits     General Comments: pt very HOH and may not be answering questions accurately due to this, minimal verbal response        Exercises      General Comments        Pertinent Vitals/Pain Pain Assessment Pain Assessment: Faces Faces Pain Scale: Hurts little more Pain Location: side Pain Descriptors /  Indicators: Grimacing, Guarding Pain Intervention(s): Limited activity within patient's tolerance, Monitored during session    Home Living Family/patient expects to be discharged to:: Private residence Living Arrangements: Alone Available Help at Discharge: Family;Available PRN/intermittently Type of Home: Mobile home Home Access: Stairs to enter Entrance Stairs-Rails: Doctor, general practice of Steps: 3   Home Layout: One level Home Equipment: Agricultural consultant (2 wheels) Additional Comments: pt repors that she lives with her son, however per admission notes, she lives on her own with the assistance of some social work, she has been refusing to be moved to a facility. EMS was called to her home by social work,    Prior Function            PT Goals (current goals can now be found in the care plan section) Acute Rehab PT Goals PT Goal Formulation: With patient Time For Goal Achievement: 06/07/23 Potential to Achieve Goals: Fair Progress towards PT goals: Progressing toward goals    Frequency    Min 1X/week      PT Plan      Co-evaluation              AM-PAC PT "6 Clicks" Mobility   Outcome Measure  Help needed turning from your back to your side while in a flat bed without using bedrails?: A Little Help needed moving from lying on your back to sitting on the side of a flat bed without using bedrails?: A Lot Help needed moving to and from a bed to a chair (including a wheelchair)?: A Lot Help needed standing up from a chair using your arms (e.g., wheelchair or bedside chair)?: A Lot Help needed to walk in hospital room?: Total Help needed climbing 3-5 steps with a railing? : Total 6 Click Score: 11    End of Session Equipment Utilized During Treatment: Gait belt;Oxygen Activity Tolerance: Patient limited by fatigue Patient left: with call bell/phone within reach;with nursing/sitter in room;in chair   PT Visit Diagnosis: Other abnormalities of gait and  mobility (R26.89);Difficulty in walking, not elsewhere classified (R26.2)     Time: 1610-9604 PT Time Calculation (min) (ACUTE ONLY): 24 min  Charges:    $Therapeutic Activity: 23-37 mins PT General Charges $$ ACUTE PT VISIT: 1 Visit                     [name], PT Acute Rehab    Jacqualyn Posey 05/20/2023, 2:37 PM

## 2023-05-20 NOTE — Progress Notes (Signed)
[facility], [address] [place], [place] [address]    Culture   Final    NO GROWTH 3 DAYS Performed at [facility], [address] [place], [place] [address]    Report Status PENDING  Incomplete    Antimicrobials: Anti-infectives (From admission, onward)    Start     Dose/Rate Route Frequency Ordered Stop   05/21/23 1000  amoxicillin-clavulanate (AUGMENTIN) 500-125 MG per tablet 1 tablet        1 tablet Oral 2 times daily 05/20/23 1028     05/18/23 1500  cefTRIAXone (ROCEPHIN) 1 g in sodium chloride 0.9 % 100 mL IVPB  Status:  Discontinued        1 g 200 mL/hr over 30 Minutes Intravenous Every 24 hours 05/17/23 1412 05/18/23 0542   05/18/23 1200  azithromycin (ZITHROMAX) 500 mg in sodium chloride 0.9 % 250 mL IVPB  Status:  Discontinued        500 mg 250 mL/hr over 60 Minutes Intravenous Every 24 hours 05/17/23 1412 05/18/23 1101   05/18/23 1000  cefTRIAXone (ROCEPHIN) 2 g in sodium chloride 0.9 % 100 mL IVPB  Status:  Discontinued        2 g 200 mL/hr over 30 Minutes Intravenous Every 24 hours 05/18/23 0544 05/20/23 1028   05/17/23 1400  cefTRIAXone (ROCEPHIN) injection 1 g        1 g Intramuscular  Once 05/17/23 1346 05/17/23 1450   05/17/23 1300  cefTRIAXone (ROCEPHIN) 1 g in sodium chloride 0.9 % 100 mL IVPB  Status:  Discontinued        1 g 200 mL/hr over 30 Minutes Intravenous  Once 05/17/23 1257 05/17/23 2011   05/17/23 1300  azithromycin (ZITHROMAX) tablet 500 mg        500 mg Oral  Once 05/17/23 1257 05/17/23 1327      Culture/Microbiology    Component Value Date/Time    SDES  05/17/2023 2049    BLOOD LEFT ANTECUBITAL Performed at [facility], [address] [place], [place] [address]    SPECREQUEST  05/17/2023 2049    BOTTLES DRAWN AEROBIC AND ANAEROBIC Blood Culture adequate volume Performed at [facility], [address], [place], [place] [address]    CULT  05/17/2023 2049    NO GROWTH 3 DAYS Performed at [facility], [address], [place], [place] [address]    REPTSTATUS PENDING 05/17/2023 2049    Other culture-see note  Radiology Studies: ECHOCARDIOGRAM COMPLETE  Result Date: 05/19/2023    ECHOCARDIOGRAM REPORT   Patient Name:   Alicia Schwartz Date of Exam: 05/19/2023 Medical Rec #:  [id number]           Height:       59.0 in Accession #:    [id number]          Weight:       104.3 lb Date of Birth:  1931-03-21            BSA:          1.398 m Patient Age:    89 years            BP:           141/72 mmHg Patient Gender: F                   HR:           97 bpm. Exam Location:  Inpatient  [facility], [address] [place], [place] [address]    Culture   Final    NO GROWTH 3 DAYS Performed at [facility], [address] [place], [place] [address]    Report Status PENDING  Incomplete    Antimicrobials: Anti-infectives (From admission, onward)    Start     Dose/Rate Route Frequency Ordered Stop   05/21/23 1000  amoxicillin-clavulanate (AUGMENTIN) 500-125 MG per tablet 1 tablet        1 tablet Oral 2 times daily 05/20/23 1028     05/18/23 1500  cefTRIAXone (ROCEPHIN) 1 g in sodium chloride 0.9 % 100 mL IVPB  Status:  Discontinued        1 g 200 mL/hr over 30 Minutes Intravenous Every 24 hours 05/17/23 1412 05/18/23 0542   05/18/23 1200  azithromycin (ZITHROMAX) 500 mg in sodium chloride 0.9 % 250 mL IVPB  Status:  Discontinued        500 mg 250 mL/hr over 60 Minutes Intravenous Every 24 hours 05/17/23 1412 05/18/23 1101   05/18/23 1000  cefTRIAXone (ROCEPHIN) 2 g in sodium chloride 0.9 % 100 mL IVPB  Status:  Discontinued        2 g 200 mL/hr over 30 Minutes Intravenous Every 24 hours 05/18/23 0544 05/20/23 1028   05/17/23 1400  cefTRIAXone (ROCEPHIN) injection 1 g        1 g Intramuscular  Once 05/17/23 1346 05/17/23 1450   05/17/23 1300  cefTRIAXone (ROCEPHIN) 1 g in sodium chloride 0.9 % 100 mL IVPB  Status:  Discontinued        1 g 200 mL/hr over 30 Minutes Intravenous  Once 05/17/23 1257 05/17/23 2011   05/17/23 1300  azithromycin (ZITHROMAX) tablet 500 mg        500 mg Oral  Once 05/17/23 1257 05/17/23 1327      Culture/Microbiology    Component Value Date/Time    SDES  05/17/2023 2049    BLOOD LEFT ANTECUBITAL Performed at [facility], [address] [place], [place] [address]    SPECREQUEST  05/17/2023 2049    BOTTLES DRAWN AEROBIC AND ANAEROBIC Blood Culture adequate volume Performed at [facility], [address], [place], [place] [address]    CULT  05/17/2023 2049    NO GROWTH 3 DAYS Performed at [facility], [address], [place], [place] [address]    REPTSTATUS PENDING 05/17/2023 2049    Other culture-see note  Radiology Studies: ECHOCARDIOGRAM COMPLETE  Result Date: 05/19/2023    ECHOCARDIOGRAM REPORT   Patient Name:   Alicia Schwartz Date of Exam: 05/19/2023 Medical Rec #:  [id number]           Height:       59.0 in Accession #:    [id number]          Weight:       104.3 lb Date of Birth:  1931-03-21            BSA:          1.398 m Patient Age:    89 years            BP:           141/72 mmHg Patient Gender: F                   HR:           97 bpm. Exam Location:  Inpatient  [facility], [address] [place], [place] [address]    Culture   Final    NO GROWTH 3 DAYS Performed at [facility], [address] [place], [place] [address]    Report Status PENDING  Incomplete    Antimicrobials: Anti-infectives (From admission, onward)    Start     Dose/Rate Route Frequency Ordered Stop   05/21/23 1000  amoxicillin-clavulanate (AUGMENTIN) 500-125 MG per tablet 1 tablet        1 tablet Oral 2 times daily 05/20/23 1028     05/18/23 1500  cefTRIAXone (ROCEPHIN) 1 g in sodium chloride 0.9 % 100 mL IVPB  Status:  Discontinued        1 g 200 mL/hr over 30 Minutes Intravenous Every 24 hours 05/17/23 1412 05/18/23 0542   05/18/23 1200  azithromycin (ZITHROMAX) 500 mg in sodium chloride 0.9 % 250 mL IVPB  Status:  Discontinued        500 mg 250 mL/hr over 60 Minutes Intravenous Every 24 hours 05/17/23 1412 05/18/23 1101   05/18/23 1000  cefTRIAXone (ROCEPHIN) 2 g in sodium chloride 0.9 % 100 mL IVPB  Status:  Discontinued        2 g 200 mL/hr over 30 Minutes Intravenous Every 24 hours 05/18/23 0544 05/20/23 1028   05/17/23 1400  cefTRIAXone (ROCEPHIN) injection 1 g        1 g Intramuscular  Once 05/17/23 1346 05/17/23 1450   05/17/23 1300  cefTRIAXone (ROCEPHIN) 1 g in sodium chloride 0.9 % 100 mL IVPB  Status:  Discontinued        1 g 200 mL/hr over 30 Minutes Intravenous  Once 05/17/23 1257 05/17/23 2011   05/17/23 1300  azithromycin (ZITHROMAX) tablet 500 mg        500 mg Oral  Once 05/17/23 1257 05/17/23 1327      Culture/Microbiology    Component Value Date/Time    SDES  05/17/2023 2049    BLOOD LEFT ANTECUBITAL Performed at [facility], [address] [place], [place] [address]    SPECREQUEST  05/17/2023 2049    BOTTLES DRAWN AEROBIC AND ANAEROBIC Blood Culture adequate volume Performed at [facility], [address], [place], [place] [address]    CULT  05/17/2023 2049    NO GROWTH 3 DAYS Performed at [facility], [address], [place], [place] [address]    REPTSTATUS PENDING 05/17/2023 2049    Other culture-see note  Radiology Studies: ECHOCARDIOGRAM COMPLETE  Result Date: 05/19/2023    ECHOCARDIOGRAM REPORT   Patient Name:   Alicia Schwartz Date of Exam: 05/19/2023 Medical Rec #:  [id number]           Height:       59.0 in Accession #:    [id number]          Weight:       104.3 lb Date of Birth:  1931-03-21            BSA:          1.398 m Patient Age:    89 years            BP:           141/72 mmHg Patient Gender: F                   HR:           97 bpm. Exam Location:  Inpatient  PROGRESS NOTE Alicia Schwartz  UEA:[id number] DOB: 1931/02/05 DOA: 05/17/2023 PCP: Linus Galas, NP  Brief Narrative/Hospital Course: 89 year old F with PMH of paroxysmal SVT, HTN and diminished hearing brought to ED after a social worker called EMS due to generalized weakness, acute on chronic low back pain and ambulatory dysfunction.  Reportedly saturating at 88% when EMS arrived.  Other vitals were okay.  She has leukocytosis to 27 with left shift.  CXR with small right-sided pleural effusion and possible consolidation versus mass.  Blood cultures obtained.  Patient was started on IV ceftriaxone and azithromycin, and admitted for severe sepsis due to community-acquired pneumonia.   The next day, blood culture with Streptococcus pneumonia in 1 out of 2 bottles.  Azithromycin discontinued.  ID consulted, advised CT to evaluate pleural effusion and continue ceftriaxone  Subjective: Patient seen and examined this morning Alert awake able to tell me her name, has no complaint, is very hard of hearing. Overnight afebrile.  Labs shows leukocytosis persistent at 18.6 improved from 27.1 previously, hemoglobin 9.2 g renal function stable   Assessment and Plan: Principal Problem:   Sepsis due to pneumonia (HCC)   Severe sepsis due to pneumonia and bacteremia Community-acquired pneumonia Right pleural effusion: Blood culture with Streptococcus pneumonia bacteremia-source felt to be community-acquired pneumonia, continue ceftriaxone, follow-up cultures consider report, ID following, follow-up CT chest to eval for pleural effusion for antibiotic duration. Continue mucolytic's bronchodilators aspiration precaution, SLP following  on regular diet. 2D echo ordered for vegetation evaluation and a fib-shows EF 50-55%, no RWMA, mitral valve grossly normal, aortic valve tricuspid valve Recent Labs  Lab 05/17/23 1303 05/17/23 1304 05/18/23 0158 05/19/23 0343 05/20/23 0322  WBC 27.1*  --  24.3* 18.2* 18.6*   LATICACIDVEN  --  0.9  --   --   --     Acute hypoxic respiratory failure due to pneumonia: Continue supplemental oxygen, follow-up CT chest.  AKI on CKD 3B: Based on creatinine 1.2-1.3, on losartan, AKI resolved.  Off IV fluids.  New onset A-fib: Likely from sepsis, TSH normal, now started on Toprol not on anticoagulation due to fall risk after discussing risk benefits with patient's sons by previous provider, follow-up echocardiogram is stable.  Hypokalemia: Replaced.  Monitor  Anemia of renal disease: Hemoglobin being monitored transfuse for less than 7 g continue iron supplement  Ambulatory dysfunction and generalized weakness deconditioning: PT OT evaluation TOC consult, social worker concerned about patient being home alone  Goals of care: DNR, severely diminished hearing   DVT prophylaxis: heparin injection 5,000 Units Start: 05/18/23 2200 SCDs Start: 05/17/23 1411 Code Status:   Code Status: Do not attempt resuscitation (DNR) PRE-ARREST INTERVENTIONS DESIRED Family Communication: plan of care discussed with patient at bedside. Patient status is: Inpatient because of pneumonia Level of care: Telemetry   Dispo: The patient is from: home            Anticipated disposition: TBD. PTOT recommending skilled nursing facility. Objective: Vitals last 24 hrs: Vitals:   05/19/23 1208 05/19/23 2043 05/20/23 0633 05/20/23 1203  BP: 138/68 135/69 (!) 142/88 (!) 147/82  Pulse: 87 95 95 (!) 105  Resp: 18 20 17 20   Temp: 97.9 F (36.6 C) (!) 97.5 F (36.4 C) 97.8 F (36.6 C) 98.1 F (36.7 C)  TempSrc: Oral Oral Oral Oral  SpO2: 97% 97% 95% 95%  Weight:      Height:       Weight change:   Physical Examination: General exam: alert awake, older than stated  PROGRESS NOTE Alicia Schwartz  UEA:[id number] DOB: 1931/02/05 DOA: 05/17/2023 PCP: Linus Galas, NP  Brief Narrative/Hospital Course: 89 year old F with PMH of paroxysmal SVT, HTN and diminished hearing brought to ED after a social worker called EMS due to generalized weakness, acute on chronic low back pain and ambulatory dysfunction.  Reportedly saturating at 88% when EMS arrived.  Other vitals were okay.  She has leukocytosis to 27 with left shift.  CXR with small right-sided pleural effusion and possible consolidation versus mass.  Blood cultures obtained.  Patient was started on IV ceftriaxone and azithromycin, and admitted for severe sepsis due to community-acquired pneumonia.   The next day, blood culture with Streptococcus pneumonia in 1 out of 2 bottles.  Azithromycin discontinued.  ID consulted, advised CT to evaluate pleural effusion and continue ceftriaxone  Subjective: Patient seen and examined this morning Alert awake able to tell me her name, has no complaint, is very hard of hearing. Overnight afebrile.  Labs shows leukocytosis persistent at 18.6 improved from 27.1 previously, hemoglobin 9.2 g renal function stable   Assessment and Plan: Principal Problem:   Sepsis due to pneumonia (HCC)   Severe sepsis due to pneumonia and bacteremia Community-acquired pneumonia Right pleural effusion: Blood culture with Streptococcus pneumonia bacteremia-source felt to be community-acquired pneumonia, continue ceftriaxone, follow-up cultures consider report, ID following, follow-up CT chest to eval for pleural effusion for antibiotic duration. Continue mucolytic's bronchodilators aspiration precaution, SLP following  on regular diet. 2D echo ordered for vegetation evaluation and a fib-shows EF 50-55%, no RWMA, mitral valve grossly normal, aortic valve tricuspid valve Recent Labs  Lab 05/17/23 1303 05/17/23 1304 05/18/23 0158 05/19/23 0343 05/20/23 0322  WBC 27.1*  --  24.3* 18.2* 18.6*   LATICACIDVEN  --  0.9  --   --   --     Acute hypoxic respiratory failure due to pneumonia: Continue supplemental oxygen, follow-up CT chest.  AKI on CKD 3B: Based on creatinine 1.2-1.3, on losartan, AKI resolved.  Off IV fluids.  New onset A-fib: Likely from sepsis, TSH normal, now started on Toprol not on anticoagulation due to fall risk after discussing risk benefits with patient's sons by previous provider, follow-up echocardiogram is stable.  Hypokalemia: Replaced.  Monitor  Anemia of renal disease: Hemoglobin being monitored transfuse for less than 7 g continue iron supplement  Ambulatory dysfunction and generalized weakness deconditioning: PT OT evaluation TOC consult, social worker concerned about patient being home alone  Goals of care: DNR, severely diminished hearing   DVT prophylaxis: heparin injection 5,000 Units Start: 05/18/23 2200 SCDs Start: 05/17/23 1411 Code Status:   Code Status: Do not attempt resuscitation (DNR) PRE-ARREST INTERVENTIONS DESIRED Family Communication: plan of care discussed with patient at bedside. Patient status is: Inpatient because of pneumonia Level of care: Telemetry   Dispo: The patient is from: home            Anticipated disposition: TBD. PTOT recommending skilled nursing facility. Objective: Vitals last 24 hrs: Vitals:   05/19/23 1208 05/19/23 2043 05/20/23 0633 05/20/23 1203  BP: 138/68 135/69 (!) 142/88 (!) 147/82  Pulse: 87 95 95 (!) 105  Resp: 18 20 17 20   Temp: 97.9 F (36.6 C) (!) 97.5 F (36.4 C) 97.8 F (36.6 C) 98.1 F (36.7 C)  TempSrc: Oral Oral Oral Oral  SpO2: 97% 97% 95% 95%  Weight:      Height:       Weight change:   Physical Examination: General exam: alert awake, older than stated  PROGRESS NOTE Alicia Schwartz  UEA:[id number] DOB: 1931/02/05 DOA: 05/17/2023 PCP: Linus Galas, NP  Brief Narrative/Hospital Course: 89 year old F with PMH of paroxysmal SVT, HTN and diminished hearing brought to ED after a social worker called EMS due to generalized weakness, acute on chronic low back pain and ambulatory dysfunction.  Reportedly saturating at 88% when EMS arrived.  Other vitals were okay.  She has leukocytosis to 27 with left shift.  CXR with small right-sided pleural effusion and possible consolidation versus mass.  Blood cultures obtained.  Patient was started on IV ceftriaxone and azithromycin, and admitted for severe sepsis due to community-acquired pneumonia.   The next day, blood culture with Streptococcus pneumonia in 1 out of 2 bottles.  Azithromycin discontinued.  ID consulted, advised CT to evaluate pleural effusion and continue ceftriaxone  Subjective: Patient seen and examined this morning Alert awake able to tell me her name, has no complaint, is very hard of hearing. Overnight afebrile.  Labs shows leukocytosis persistent at 18.6 improved from 27.1 previously, hemoglobin 9.2 g renal function stable   Assessment and Plan: Principal Problem:   Sepsis due to pneumonia (HCC)   Severe sepsis due to pneumonia and bacteremia Community-acquired pneumonia Right pleural effusion: Blood culture with Streptococcus pneumonia bacteremia-source felt to be community-acquired pneumonia, continue ceftriaxone, follow-up cultures consider report, ID following, follow-up CT chest to eval for pleural effusion for antibiotic duration. Continue mucolytic's bronchodilators aspiration precaution, SLP following  on regular diet. 2D echo ordered for vegetation evaluation and a fib-shows EF 50-55%, no RWMA, mitral valve grossly normal, aortic valve tricuspid valve Recent Labs  Lab 05/17/23 1303 05/17/23 1304 05/18/23 0158 05/19/23 0343 05/20/23 0322  WBC 27.1*  --  24.3* 18.2* 18.6*   LATICACIDVEN  --  0.9  --   --   --     Acute hypoxic respiratory failure due to pneumonia: Continue supplemental oxygen, follow-up CT chest.  AKI on CKD 3B: Based on creatinine 1.2-1.3, on losartan, AKI resolved.  Off IV fluids.  New onset A-fib: Likely from sepsis, TSH normal, now started on Toprol not on anticoagulation due to fall risk after discussing risk benefits with patient's sons by previous provider, follow-up echocardiogram is stable.  Hypokalemia: Replaced.  Monitor  Anemia of renal disease: Hemoglobin being monitored transfuse for less than 7 g continue iron supplement  Ambulatory dysfunction and generalized weakness deconditioning: PT OT evaluation TOC consult, social worker concerned about patient being home alone  Goals of care: DNR, severely diminished hearing   DVT prophylaxis: heparin injection 5,000 Units Start: 05/18/23 2200 SCDs Start: 05/17/23 1411 Code Status:   Code Status: Do not attempt resuscitation (DNR) PRE-ARREST INTERVENTIONS DESIRED Family Communication: plan of care discussed with patient at bedside. Patient status is: Inpatient because of pneumonia Level of care: Telemetry   Dispo: The patient is from: home            Anticipated disposition: TBD. PTOT recommending skilled nursing facility. Objective: Vitals last 24 hrs: Vitals:   05/19/23 1208 05/19/23 2043 05/20/23 0633 05/20/23 1203  BP: 138/68 135/69 (!) 142/88 (!) 147/82  Pulse: 87 95 95 (!) 105  Resp: 18 20 17 20   Temp: 97.9 F (36.6 C) (!) 97.5 F (36.4 C) 97.8 F (36.6 C) 98.1 F (36.7 C)  TempSrc: Oral Oral Oral Oral  SpO2: 97% 97% 95% 95%  Weight:      Height:       Weight change:   Physical Examination: General exam: alert awake, older than stated  [facility], [address] [place], [place] [address]    Culture   Final    NO GROWTH 3 DAYS Performed at [facility], [address] [place], [place] [address]    Report Status PENDING  Incomplete    Antimicrobials: Anti-infectives (From admission, onward)    Start     Dose/Rate Route Frequency Ordered Stop   05/21/23 1000  amoxicillin-clavulanate (AUGMENTIN) 500-125 MG per tablet 1 tablet        1 tablet Oral 2 times daily 05/20/23 1028     05/18/23 1500  cefTRIAXone (ROCEPHIN) 1 g in sodium chloride 0.9 % 100 mL IVPB  Status:  Discontinued        1 g 200 mL/hr over 30 Minutes Intravenous Every 24 hours 05/17/23 1412 05/18/23 0542   05/18/23 1200  azithromycin (ZITHROMAX) 500 mg in sodium chloride 0.9 % 250 mL IVPB  Status:  Discontinued        500 mg 250 mL/hr over 60 Minutes Intravenous Every 24 hours 05/17/23 1412 05/18/23 1101   05/18/23 1000  cefTRIAXone (ROCEPHIN) 2 g in sodium chloride 0.9 % 100 mL IVPB  Status:  Discontinued        2 g 200 mL/hr over 30 Minutes Intravenous Every 24 hours 05/18/23 0544 05/20/23 1028   05/17/23 1400  cefTRIAXone (ROCEPHIN) injection 1 g        1 g Intramuscular  Once 05/17/23 1346 05/17/23 1450   05/17/23 1300  cefTRIAXone (ROCEPHIN) 1 g in sodium chloride 0.9 % 100 mL IVPB  Status:  Discontinued        1 g 200 mL/hr over 30 Minutes Intravenous  Once 05/17/23 1257 05/17/23 2011   05/17/23 1300  azithromycin (ZITHROMAX) tablet 500 mg        500 mg Oral  Once 05/17/23 1257 05/17/23 1327      Culture/Microbiology    Component Value Date/Time    SDES  05/17/2023 2049    BLOOD LEFT ANTECUBITAL Performed at [facility], [address] [place], [place] [address]    SPECREQUEST  05/17/2023 2049    BOTTLES DRAWN AEROBIC AND ANAEROBIC Blood Culture adequate volume Performed at [facility], [address], [place], [place] [address]    CULT  05/17/2023 2049    NO GROWTH 3 DAYS Performed at [facility], [address], [place], [place] [address]    REPTSTATUS PENDING 05/17/2023 2049    Other culture-see note  Radiology Studies: ECHOCARDIOGRAM COMPLETE  Result Date: 05/19/2023    ECHOCARDIOGRAM REPORT   Patient Name:   Alicia Schwartz Date of Exam: 05/19/2023 Medical Rec #:  [id number]           Height:       59.0 in Accession #:    [id number]          Weight:       104.3 lb Date of Birth:  1931-03-21            BSA:          1.398 m Patient Age:    89 years            BP:           141/72 mmHg Patient Gender: F                   HR:           97 bpm. Exam Location:  Inpatient

## 2023-05-21 DIAGNOSIS — J189 Pneumonia, unspecified organism: Secondary | ICD-10-CM | POA: Diagnosis not present

## 2023-05-21 DIAGNOSIS — A419 Sepsis, unspecified organism: Secondary | ICD-10-CM | POA: Diagnosis not present

## 2023-05-21 LAB — CBC
HCT: 27 % — ABNORMAL LOW (ref 36.0–46.0)
Hemoglobin: 9 g/dL — ABNORMAL LOW (ref 12.0–15.0)
MCH: 34.4 pg — ABNORMAL HIGH (ref 26.0–34.0)
MCHC: 33.3 g/dL (ref 30.0–36.0)
MCV: 103.1 fL — ABNORMAL HIGH (ref 80.0–100.0)
Platelets: 333 10*3/uL (ref 150–400)
RBC: 2.62 MIL/uL — ABNORMAL LOW (ref 3.87–5.11)
RDW: 13.9 % (ref 11.5–15.5)
WBC: 18 10*3/uL — ABNORMAL HIGH (ref 4.0–10.5)
nRBC: 0 % (ref 0.0–0.2)

## 2023-05-21 LAB — BASIC METABOLIC PANEL
Anion gap: 5 (ref 5–15)
BUN: 21 mg/dL (ref 8–23)
CO2: 24 mmol/L (ref 22–32)
Calcium: 8 mg/dL — ABNORMAL LOW (ref 8.9–10.3)
Chloride: 106 mmol/L (ref 98–111)
Creatinine, Ser: 0.96 mg/dL (ref 0.44–1.00)
GFR, Estimated: 56 mL/min — ABNORMAL LOW (ref >60)
Glucose, Bld: 98 mg/dL (ref 70–99)
Potassium: 4.4 mmol/L (ref 3.5–5.1)
Sodium: 135 mmol/L (ref 135–145)

## 2023-05-21 LAB — GLUCOSE, CAPILLARY: Glucose-Capillary: 97 mg/dL (ref 70–99)

## 2023-05-21 NOTE — TOC Initial Note (Signed)
Transition of Care [facility]) - Initial/Assessment Note    Patient Details  Name: Alicia Schwartz MRN: [id number] Date of Birth: 01-30-1931  Transition of Care [facility]) CM/SW Contact:    Lanier Clam, RN Phone Number: 05/21/2023, 11:58 AM  Clinical Narrative: Noted patient alert x1, spoke to [name] about d/c plans-from home,w/[name], has rollator, [facility] PT recc [facility]-agree to fax out;has gone to [facility] in past(decline to return) await bed offers prior auth.                  Expected Discharge Plan: Skilled Nursing Facility Barriers to Discharge: Continued Medical Work up   Patient Goals and CMS Choice Patient states their goals for this hospitalization and ongoing recovery are:: Rehab CMS Medicare.gov Compare Post Acute Care list provided to:: Patient Represenative (must comment) ([name]) Choice offered to / list presented to : Adult Children [place] ownership interest in [facility].provided to:: Adult Children    Expected Discharge Plan and Services   Discharge Planning Services: CM Consult Post Acute Care Choice: Skilled Nursing Facility Living arrangements for the past 2 months: Single Family Home                                      Prior Living Arrangements/Services Living arrangements for the past 2 months: Single Family Home Lives with:: Adult Children Patient language and need for interpreter reviewed:: Yes Do you feel safe going back to the place where you live?: Yes      Need for Family Participation in Patient Care: Yes (Comment) Care giver support system in place?: Yes (comment) Current home services: DME ([facility]; [facility]) Criminal Activity/Legal Involvement Pertinent to Current Situation/Hospitalization: No - Comment as needed  Activities of Daily Living   ADL Screening (condition at time of admission) Independently performs ADLs?: No Does the patient have a NEW  difficulty with bathing/dressing/toileting/self-feeding that is expected to last >3 days?: No Does the patient have a NEW difficulty with getting in/out of bed, walking, or climbing stairs that is expected to last >3 days?: No Does the patient have a NEW difficulty with communication that is expected to last >3 days?: No Is the patient deaf or have difficulty hearing?: No Does the patient have difficulty seeing, even when wearing glasses/contacts?: Yes Does the patient have difficulty concentrating, remembering, or making decisions?: Yes  Permission Sought/Granted Permission sought to share information with : Case Manager Permission granted to share information with : Yes, Verbal Permission Granted  Share Information with NAME: Case manager           Emotional Assessment Appearance:: Appears stated age Attitude/Demeanor/Rapport: Gracious Affect (typically observed): Accepting Orientation: : Oriented to Self Alcohol / Substance Use: Not Applicable Psych Involvement: No (comment)  Admission diagnosis:  Pleural effusion [J90] Sepsis due to pneumonia (HCC) [J18.9, A41.9] Atrial fibrillation, unspecified type (HCC) [I48.91] Community acquired pneumonia of right lung, unspecified part of lung [J18.9] Patient Active Problem List   Diagnosis Date Noted   Sepsis due to pneumonia (HCC) 05/17/2023   Elevated TSH 02/26/2022   Normocytic anemia 02/24/2022   Hypokalemia    Physical deconditioning    AKI (acute kidney injury) (HCC) 02/23/2022   Lower extremity edema 01/26/2022   Closed T12 fracture (HCC) 07/04/2019   Hypertensive urgency 07/04/2019   Back pain 07/04/2019   Pressure injury of skin 07/04/2019   Postoperative anemia  due to acute blood loss 02/14/2015   Paroxysmal supraventricular tachycardia (HCC) 02/12/2015   Fracture, subtrochanteric, right femur, closed (HCC) 02/11/2015   Fall due to stumbling 02/11/2015   Anxiety state 09/19/2006   HTN (hypertension) 09/19/2006    PCP:  Linus Galas, NP Pharmacy:   [facility] [id number] [place] - [address] [place] [address] Phone: [phone] Fax: [phone]     Social Determinants of Health (SDOH) Social History: SDOH Screenings   Food Insecurity: No Food Insecurity (05/20/2023)  Housing: Low Risk  (05/20/2023)  Transportation Needs: Patient Unable To Answer (05/20/2023)  Utilities: Patient Unable To Answer (05/20/2023)  Tobacco Use: Low Risk  (05/17/2023)   SDOH Interventions:     Readmission Risk Interventions     No data to display

## 2023-05-21 NOTE — Plan of Care (Signed)
  Problem: Clinical Measurements: Goal: Diagnostic test results will improve Outcome: Progressing Goal: Respiratory complications will improve Outcome: Progressing   Problem: Activity: Goal: Risk for activity intolerance will decrease Outcome: Progressing   Problem: Nutrition: Goal: Adequate nutrition will be maintained Outcome: Progressing   Problem: Pain Management: Goal: General experience of comfort will improve Outcome: Progressing

## 2023-05-21 NOTE — Progress Notes (Signed)
Occupational Therapy Treatment Patient Details Name: Alicia Schwartz MRN: [id number] DOB: 11/29/1930 Today's Date: 05/21/2023   History of present illness Alicia Schwartz is a 89 y.o. female with medical history significant for hypertension, paroxysmal SVT being admitted to the hospital with acute hypoxic respiratory failure and weakness likely due to community-acquired pneumonia   OT comments  The pt was seen for functional strengthening and ADL participation and instruction. She was assisted into sitting EOB, where she required increased assist for upper & lower body dressing. She further required assist to stand using a RW, though her overall standing tolerance was decreased, due to weakness and deconditioning. She declined to attempt transfer to the chair, despite encouragement and education on the importance of increasing out of bed activity. She reported feelings of general malaise. Continue OT plan of care. Patient will benefit from continued inpatient follow up therapy, <3 hours/day.       If plan is discharge home, recommend the following:  A lot of help with bathing/dressing/bathroom;A little help with walking and/or transfers;Assistance with cooking/housework;Direct supervision/assist for medications management;Assist for transportation;Supervision due to cognitive status   Equipment Recommendations  Other (comment) (defer to next level of care)    Recommendations for Other Services      Precautions / Restrictions Precautions Precautions: Fall       Mobility Bed Mobility Overal bed mobility: Needs Assistance Bed Mobility: Sit to Supine, Supine to Sit     Supine to sit: HOB elevated, Used rails, Mod assist Sit to supine: Mod assist   General bed mobility comments: required increased time and effort for bed mobility, with cues to reach for and pull on bed rail, as well as advance BLE off bed    Transfers Overall transfer level: Needs assistance Equipment used:  Rolling walker (2 wheels) Transfers: Sit to/from Stand Sit to Stand: Mod assist           General transfer comment: required instruction on hand placement and to demo trunk extension once in standing         ADL either performed or assessed with clinical judgement   ADL Overall ADL's : Needs assistance/impaired Eating/Feeding: Set up;Sitting Eating/Feeding Details (indicate cue type and reason): at chair level, based on clinical judgement Grooming: Minimal assistance;Sitting Grooming Details (indicate cue type and reason): based on clinical judgement         Upper Body Dressing : Moderate assistance Upper Body Dressing Details (indicate cue type and reason): She required assist to doff a hospital gown then to donn another one seated EOB. Lower Body Dressing: Total assistance Lower Body Dressing Details (indicate cue type and reason): Pt required assist to doff her socks seated EOB.                      Cognition Arousal: Alert Behavior During Therapy: WFL for tasks assessed/performed Overall Cognitive Status: No family/caregiver present to determine baseline cognitive functioning Area of Impairment: Problem solving, Memory, Awareness          Problem Solving: Difficulty sequencing, Requires verbal cues                     Pertinent Vitals/ Pain       Pain Assessment Pain Assessment: No/denies pain         Frequency  Min 1X/week        Progress Toward Goals  OT Goals(current goals can now be found in the care plan section)  Acute Rehab OT Goals Patient Stated Goal: to feel better OT Goal Formulation: With patient Time For Goal Achievement: 06/01/23 Potential to Achieve Goals: Good  Plan      AM-PAC OT "6 Clicks" Daily Activity     Outcome Measure   Help from another person eating meals?: A Little Help from another person taking care of personal grooming?: A Little Help from another person toileting, which includes using toliet,  bedpan, or urinal?: A Lot Help from another person bathing (including washing, rinsing, drying)?: A Lot Help from another person to put on and taking off regular upper body clothing?: A Lot Help from another person to put on and taking off regular lower body clothing?: A Lot 6 Click Score: 14    End of Session Equipment Utilized During Treatment: Rolling walker (2 wheels);Oxygen  OT Visit Diagnosis: Unsteadiness on feet (R26.81);Muscle weakness (generalized) (M62.81);Other abnormalities of gait and mobility (R26.89)   Activity Tolerance Patient limited by fatigue   Patient Left in bed;with nursing/sitter in room   Nurse Communication Other (comment) (informed nurse tech of pt needing assistance for clean-up)        Time: 2956-2130 OT Time Calculation (min): 18 min  Charges: OT General Charges $OT Visit: 1 Visit OT Treatments $Therapeutic Activity: 8-22 mins    Reuben Likes, OTR/L 05/21/2023, 3:25 PM

## 2023-05-21 NOTE — Plan of Care (Signed)
10/28 ct chest reviewed 1. Moderate right and small left pleural effusions, including a loculated right-sided fissural component. 2. Complete collapse of the right lower lobe and partial collapse of the right middle lobe and left lower lobe secondary to pleural effusions. 3. Age-indeterminate severe compression deformity of T9. Correlate for point tenderness. 4. Severe aortic Atherosclerosis (ICD10-I70.0) with area of tortuosity and focal narrowing involving the descending thoracic aorta, likely unchanged when compared with calcification pattern on prior thoracic spine radiograph dated Dec 14, 2020, although lack of IV contrast limits evaluation.    Discussed finding with pulm/ccm and primary team  Pulm ccm will discuss with patient's family regarding chest tube  Continue augmentin for now

## 2023-05-21 NOTE — Progress Notes (Signed)
PROGRESS NOTE Alicia Schwartz  ZOX:[id number] DOB: 1930/09/24 DOA: 05/17/2023 PCP: Linus Galas, NP  Brief Narrative/Hospital Course: 89 year old F with PMH of paroxysmal SVT, HTN and diminished hearing brought to ED after a social worker called EMS due to generalized weakness, acute on chronic low back pain and ambulatory dysfunction.  Reportedly saturating at 88% when EMS arrived.  Other vitals were okay.  She has leukocytosis to 27 with left shift.  CXR with small right-sided pleural effusion and possible consolidation versus mass.  Blood cultures obtained.  Patient was started on IV ceftriaxone and azithromycin, and admitted for severe sepsis due to community-acquired pneumonia.   The next day, blood culture with Streptococcus pneumonia in 1 out of 2 bottles.  Azithromycin discontinued.  ID consulted, advised CT to evaluate pleural effusion and continue ceftriaxone ID following closely, CT chest 10/28 showed: Moderate right and small left pleural effusion including loculated right-sided fissural component complete collapse of the right lower lobe and partial collapse RML and left lower lobe due to pleural effusion-PCCM consulted  Subjective: Seen and examined C/o some shortness of breath, coughing Hard of hearing Overnight afebrile BP stable on 1 L nasal cannula doing well Labs shows persistent leukocytosis 18K, stable renal function chronic anemia 9 g   Assessment and Plan: Principal Problem:   Sepsis due to pneumonia (HCC)   Severe sepsis due to pneumonia and bacteremia Community-acquired pneumonia Loculated right-sided facial component moderate right and small left pleural effusion: Blood culture with Streptococcus pneumonia bacteremia-source felt to be community-acquired pneumonia CT chest 10/28 showed: Moderate right and small left pleural effusion including loculated right-sided fissural component complete collapse of the right lower lobe and partial collapse RML and left lower  lobe due to pleural effusion-PCCM consulted, disc with ID.  Continue current antibiotic with oral Augmentin as per ID.  Wean oxygen to room air as tolerated. Continue mucolytic's bronchodilators aspiration precaution, SLP following  on regular diet.  TTE- EF 50-55%, no RWMA, mitral valve grossly normal, aortic valve tricuspid valve Recent Labs  Lab 05/17/23 1303 05/17/23 1304 05/18/23 0158 05/19/23 0343 05/20/23 0322 05/21/23 0327  WBC 27.1*  --  24.3* 18.2* 18.6* 18.0*  LATICACIDVEN  --  0.9  --   --   --   --     Acute hypoxic respiratory failure due to pneumonia: Due to pneumonia and pleural effusion as above.  Wean oxygen as tolerated to room air.    AKI on CKD 3B: Baseline creatinine 1.2-1.3, on losartan, AKI resolved.  Off IV fluids. Recent Labs    02/07/23 1541 05/17/23 1303 05/18/23 0158 05/19/23 0343 05/20/23 0322 05/21/23 0327  BUN 25* 37* 40* 31* 25* 21  CREATININE 1.31* 1.17* 1.50* 1.12* 0.99 0.96  CO2 26 25 24  21* 24 24  K 3.9 3.0* 3.3* 4.4 4.3 4.4    New onset A-fib: Likely from sepsis, TSH normal, now started on Toprol not on anticoagulation due to fall risk after discussing risk benefits with patient's sons by previous provider, follow-up echocardiogram is stable.  Hypokalemia: Resolved  Anemia of renal disease: Hemoglobin being monitored transfuse for less than 7 g continue iron supplement.  Ambulatory dysfunction and generalized weakness deconditioning: PT OT evaluation TOC consult, social worker concerned about patient being home alone  Goals of care: DNR, severely diminished hearing   DVT prophylaxis: heparin injection 5,000 Units Start: 05/18/23 2200 SCDs Start: 05/17/23 1411 Code Status:   Code Status: Do not attempt resuscitation (DNR) PRE-ARREST INTERVENTIONS DESIRED Family Communication: plan of care  SPECREQUEST  05/17/2023 2049    BOTTLES DRAWN AEROBIC AND ANAEROBIC Blood Culture adequate volume Performed at [facility], [address] [place], [place] [address]    CULT  05/17/2023 2049    NO GROWTH 3 DAYS Performed at [facility], [address], [place], [place] [address]    REPTSTATUS PENDING 05/17/2023 2049    Other culture-see note  Radiology Studies: CT CHEST WO CONTRAST  Result Date: 05/20/2023 CLINICAL DATA:  Pneumonia complications suspected, pleural effusion EXAM: CT CHEST WITHOUT CONTRAST TECHNIQUE: Multidetector CT  imaging of the chest was performed following the standard protocol without IV contrast. RADIATION DOSE REDUCTION: This exam was performed according to the departmental dose-optimization program which includes automated exposure control, adjustment of the mA and/or kV according to patient size and/or use of iterative reconstruction technique. COMPARISON:  Lumbar spine CT dated June 28, 2019; thoracic spine radiograph dated 25th 2022 FINDINGS: Cardiovascular: Cardiomegaly. Trace pericardial effusion. Severe atherosclerotic disease of the thoracic aorta. Area tortuosity and narrowing involving the mid descending thoracic aorta. Severe coronary artery calcifications. Mediastinum/Nodes: Esophagus and thyroid are unremarkable. No enlarged lymph nodes seen in the chest. Lungs/Pleura: Central airways are patent. Moderate right and small left pleural effusions, including a loculated fissural component. Within limitations of noncontrast exam, no evidence of pleural thickening. Complete collapse of the right lower lobe and partial collapse of the right middle lobe and left lower lobe. No pneumothorax. Upper Abdomen: No acute abnormality. Musculoskeletal: Age-indeterminate severe compression deformity T9. Additional severe compression deformity of T12 with vertebroplasty changes. Moderate age-indeterminate deformity of the superior endplate of L1, unchanged when compared with the prior lumbar spine CT. IMPRESSION: 1. Moderate right and small left pleural effusions, including a loculated right-sided fissural component. 2. Complete collapse of the right lower lobe and partial collapse of the right middle lobe and left lower lobe secondary to pleural effusions. 3. Age-indeterminate severe compression deformity of T9. Correlate for point tenderness. 4. Severe aortic Atherosclerosis (ICD10-I70.0) with area of tortuosity and focal narrowing involving the descending thoracic aorta, likely unchanged when compared with calcification  pattern on prior thoracic spine radiograph dated Dec 14, 2020, although lack of IV contrast limits evaluation. Electronically Signed   By: Allegra Lai M.D.   On: 05/20/2023 16:09   ECHOCARDIOGRAM COMPLETE  Result Date: 05/19/2023    ECHOCARDIOGRAM REPORT   Patient Name:   Alicia Schwartz Date of Exam: 05/19/2023 Medical Rec #:  [id number]           Height:       59.0 in Accession #:    [id number]          Weight:       104.3 lb Date of Birth:  [birth year]            BSA:          1.398 m Patient Age:    89 years            BP:           141/72 mmHg Patient Gender: F                   HR:           97 bpm. Exam Location:  Inpatient Procedure: 2D Echo, Cardiac Doppler and Color Doppler Indications:    Bacteremia R78.81                 Atrial Fibrillation I48.91  History:  SPECREQUEST  05/17/2023 2049    BOTTLES DRAWN AEROBIC AND ANAEROBIC Blood Culture adequate volume Performed at [facility], [address] [place], [place] [address]    CULT  05/17/2023 2049    NO GROWTH 3 DAYS Performed at [facility], [address], [place], [place] [address]    REPTSTATUS PENDING 05/17/2023 2049    Other culture-see note  Radiology Studies: CT CHEST WO CONTRAST  Result Date: 05/20/2023 CLINICAL DATA:  Pneumonia complications suspected, pleural effusion EXAM: CT CHEST WITHOUT CONTRAST TECHNIQUE: Multidetector CT  imaging of the chest was performed following the standard protocol without IV contrast. RADIATION DOSE REDUCTION: This exam was performed according to the departmental dose-optimization program which includes automated exposure control, adjustment of the mA and/or kV according to patient size and/or use of iterative reconstruction technique. COMPARISON:  Lumbar spine CT dated June 28, 2019; thoracic spine radiograph dated 25th 2022 FINDINGS: Cardiovascular: Cardiomegaly. Trace pericardial effusion. Severe atherosclerotic disease of the thoracic aorta. Area tortuosity and narrowing involving the mid descending thoracic aorta. Severe coronary artery calcifications. Mediastinum/Nodes: Esophagus and thyroid are unremarkable. No enlarged lymph nodes seen in the chest. Lungs/Pleura: Central airways are patent. Moderate right and small left pleural effusions, including a loculated fissural component. Within limitations of noncontrast exam, no evidence of pleural thickening. Complete collapse of the right lower lobe and partial collapse of the right middle lobe and left lower lobe. No pneumothorax. Upper Abdomen: No acute abnormality. Musculoskeletal: Age-indeterminate severe compression deformity T9. Additional severe compression deformity of T12 with vertebroplasty changes. Moderate age-indeterminate deformity of the superior endplate of L1, unchanged when compared with the prior lumbar spine CT. IMPRESSION: 1. Moderate right and small left pleural effusions, including a loculated right-sided fissural component. 2. Complete collapse of the right lower lobe and partial collapse of the right middle lobe and left lower lobe secondary to pleural effusions. 3. Age-indeterminate severe compression deformity of T9. Correlate for point tenderness. 4. Severe aortic Atherosclerosis (ICD10-I70.0) with area of tortuosity and focal narrowing involving the descending thoracic aorta, likely unchanged when compared with calcification  pattern on prior thoracic spine radiograph dated Dec 14, 2020, although lack of IV contrast limits evaluation. Electronically Signed   By: Allegra Lai M.D.   On: 05/20/2023 16:09   ECHOCARDIOGRAM COMPLETE  Result Date: 05/19/2023    ECHOCARDIOGRAM REPORT   Patient Name:   Alicia Schwartz Date of Exam: 05/19/2023 Medical Rec #:  [id number]           Height:       59.0 in Accession #:    [id number]          Weight:       104.3 lb Date of Birth:  [birth year]            BSA:          1.398 m Patient Age:    89 years            BP:           141/72 mmHg Patient Gender: F                   HR:           97 bpm. Exam Location:  Inpatient Procedure: 2D Echo, Cardiac Doppler and Color Doppler Indications:    Bacteremia R78.81                 Atrial Fibrillation I48.91  History:  SPECREQUEST  05/17/2023 2049    BOTTLES DRAWN AEROBIC AND ANAEROBIC Blood Culture adequate volume Performed at [facility], [address] [place], [place] [address]    CULT  05/17/2023 2049    NO GROWTH 3 DAYS Performed at [facility], [address], [place], [place] [address]    REPTSTATUS PENDING 05/17/2023 2049    Other culture-see note  Radiology Studies: CT CHEST WO CONTRAST  Result Date: 05/20/2023 CLINICAL DATA:  Pneumonia complications suspected, pleural effusion EXAM: CT CHEST WITHOUT CONTRAST TECHNIQUE: Multidetector CT  imaging of the chest was performed following the standard protocol without IV contrast. RADIATION DOSE REDUCTION: This exam was performed according to the departmental dose-optimization program which includes automated exposure control, adjustment of the mA and/or kV according to patient size and/or use of iterative reconstruction technique. COMPARISON:  Lumbar spine CT dated June 28, 2019; thoracic spine radiograph dated 25th 2022 FINDINGS: Cardiovascular: Cardiomegaly. Trace pericardial effusion. Severe atherosclerotic disease of the thoracic aorta. Area tortuosity and narrowing involving the mid descending thoracic aorta. Severe coronary artery calcifications. Mediastinum/Nodes: Esophagus and thyroid are unremarkable. No enlarged lymph nodes seen in the chest. Lungs/Pleura: Central airways are patent. Moderate right and small left pleural effusions, including a loculated fissural component. Within limitations of noncontrast exam, no evidence of pleural thickening. Complete collapse of the right lower lobe and partial collapse of the right middle lobe and left lower lobe. No pneumothorax. Upper Abdomen: No acute abnormality. Musculoskeletal: Age-indeterminate severe compression deformity T9. Additional severe compression deformity of T12 with vertebroplasty changes. Moderate age-indeterminate deformity of the superior endplate of L1, unchanged when compared with the prior lumbar spine CT. IMPRESSION: 1. Moderate right and small left pleural effusions, including a loculated right-sided fissural component. 2. Complete collapse of the right lower lobe and partial collapse of the right middle lobe and left lower lobe secondary to pleural effusions. 3. Age-indeterminate severe compression deformity of T9. Correlate for point tenderness. 4. Severe aortic Atherosclerosis (ICD10-I70.0) with area of tortuosity and focal narrowing involving the descending thoracic aorta, likely unchanged when compared with calcification  pattern on prior thoracic spine radiograph dated Dec 14, 2020, although lack of IV contrast limits evaluation. Electronically Signed   By: Allegra Lai M.D.   On: 05/20/2023 16:09   ECHOCARDIOGRAM COMPLETE  Result Date: 05/19/2023    ECHOCARDIOGRAM REPORT   Patient Name:   Alicia Schwartz Date of Exam: 05/19/2023 Medical Rec #:  [id number]           Height:       59.0 in Accession #:    [id number]          Weight:       104.3 lb Date of Birth:  [birth year]            BSA:          1.398 m Patient Age:    89 years            BP:           141/72 mmHg Patient Gender: F                   HR:           97 bpm. Exam Location:  Inpatient Procedure: 2D Echo, Cardiac Doppler and Color Doppler Indications:    Bacteremia R78.81                 Atrial Fibrillation I48.91  History:  PROGRESS NOTE Alicia Schwartz  ZOX:[id number] DOB: 1930/09/24 DOA: 05/17/2023 PCP: Linus Galas, NP  Brief Narrative/Hospital Course: 89 year old F with PMH of paroxysmal SVT, HTN and diminished hearing brought to ED after a social worker called EMS due to generalized weakness, acute on chronic low back pain and ambulatory dysfunction.  Reportedly saturating at 88% when EMS arrived.  Other vitals were okay.  She has leukocytosis to 27 with left shift.  CXR with small right-sided pleural effusion and possible consolidation versus mass.  Blood cultures obtained.  Patient was started on IV ceftriaxone and azithromycin, and admitted for severe sepsis due to community-acquired pneumonia.   The next day, blood culture with Streptococcus pneumonia in 1 out of 2 bottles.  Azithromycin discontinued.  ID consulted, advised CT to evaluate pleural effusion and continue ceftriaxone ID following closely, CT chest 10/28 showed: Moderate right and small left pleural effusion including loculated right-sided fissural component complete collapse of the right lower lobe and partial collapse RML and left lower lobe due to pleural effusion-PCCM consulted  Subjective: Seen and examined C/o some shortness of breath, coughing Hard of hearing Overnight afebrile BP stable on 1 L nasal cannula doing well Labs shows persistent leukocytosis 18K, stable renal function chronic anemia 9 g   Assessment and Plan: Principal Problem:   Sepsis due to pneumonia (HCC)   Severe sepsis due to pneumonia and bacteremia Community-acquired pneumonia Loculated right-sided facial component moderate right and small left pleural effusion: Blood culture with Streptococcus pneumonia bacteremia-source felt to be community-acquired pneumonia CT chest 10/28 showed: Moderate right and small left pleural effusion including loculated right-sided fissural component complete collapse of the right lower lobe and partial collapse RML and left lower  lobe due to pleural effusion-PCCM consulted, disc with ID.  Continue current antibiotic with oral Augmentin as per ID.  Wean oxygen to room air as tolerated. Continue mucolytic's bronchodilators aspiration precaution, SLP following  on regular diet.  TTE- EF 50-55%, no RWMA, mitral valve grossly normal, aortic valve tricuspid valve Recent Labs  Lab 05/17/23 1303 05/17/23 1304 05/18/23 0158 05/19/23 0343 05/20/23 0322 05/21/23 0327  WBC 27.1*  --  24.3* 18.2* 18.6* 18.0*  LATICACIDVEN  --  0.9  --   --   --   --     Acute hypoxic respiratory failure due to pneumonia: Due to pneumonia and pleural effusion as above.  Wean oxygen as tolerated to room air.    AKI on CKD 3B: Baseline creatinine 1.2-1.3, on losartan, AKI resolved.  Off IV fluids. Recent Labs    02/07/23 1541 05/17/23 1303 05/18/23 0158 05/19/23 0343 05/20/23 0322 05/21/23 0327  BUN 25* 37* 40* 31* 25* 21  CREATININE 1.31* 1.17* 1.50* 1.12* 0.99 0.96  CO2 26 25 24  21* 24 24  K 3.9 3.0* 3.3* 4.4 4.3 4.4    New onset A-fib: Likely from sepsis, TSH normal, now started on Toprol not on anticoagulation due to fall risk after discussing risk benefits with patient's sons by previous provider, follow-up echocardiogram is stable.  Hypokalemia: Resolved  Anemia of renal disease: Hemoglobin being monitored transfuse for less than 7 g continue iron supplement.  Ambulatory dysfunction and generalized weakness deconditioning: PT OT evaluation TOC consult, social worker concerned about patient being home alone  Goals of care: DNR, severely diminished hearing   DVT prophylaxis: heparin injection 5,000 Units Start: 05/18/23 2200 SCDs Start: 05/17/23 1411 Code Status:   Code Status: Do not attempt resuscitation (DNR) PRE-ARREST INTERVENTIONS DESIRED Family Communication: plan of care  PROGRESS NOTE Alicia Schwartz  ZOX:[id number] DOB: 1930/09/24 DOA: 05/17/2023 PCP: Linus Galas, NP  Brief Narrative/Hospital Course: 89 year old F with PMH of paroxysmal SVT, HTN and diminished hearing brought to ED after a social worker called EMS due to generalized weakness, acute on chronic low back pain and ambulatory dysfunction.  Reportedly saturating at 88% when EMS arrived.  Other vitals were okay.  She has leukocytosis to 27 with left shift.  CXR with small right-sided pleural effusion and possible consolidation versus mass.  Blood cultures obtained.  Patient was started on IV ceftriaxone and azithromycin, and admitted for severe sepsis due to community-acquired pneumonia.   The next day, blood culture with Streptococcus pneumonia in 1 out of 2 bottles.  Azithromycin discontinued.  ID consulted, advised CT to evaluate pleural effusion and continue ceftriaxone ID following closely, CT chest 10/28 showed: Moderate right and small left pleural effusion including loculated right-sided fissural component complete collapse of the right lower lobe and partial collapse RML and left lower lobe due to pleural effusion-PCCM consulted  Subjective: Seen and examined C/o some shortness of breath, coughing Hard of hearing Overnight afebrile BP stable on 1 L nasal cannula doing well Labs shows persistent leukocytosis 18K, stable renal function chronic anemia 9 g   Assessment and Plan: Principal Problem:   Sepsis due to pneumonia (HCC)   Severe sepsis due to pneumonia and bacteremia Community-acquired pneumonia Loculated right-sided facial component moderate right and small left pleural effusion: Blood culture with Streptococcus pneumonia bacteremia-source felt to be community-acquired pneumonia CT chest 10/28 showed: Moderate right and small left pleural effusion including loculated right-sided fissural component complete collapse of the right lower lobe and partial collapse RML and left lower  lobe due to pleural effusion-PCCM consulted, disc with ID.  Continue current antibiotic with oral Augmentin as per ID.  Wean oxygen to room air as tolerated. Continue mucolytic's bronchodilators aspiration precaution, SLP following  on regular diet.  TTE- EF 50-55%, no RWMA, mitral valve grossly normal, aortic valve tricuspid valve Recent Labs  Lab 05/17/23 1303 05/17/23 1304 05/18/23 0158 05/19/23 0343 05/20/23 0322 05/21/23 0327  WBC 27.1*  --  24.3* 18.2* 18.6* 18.0*  LATICACIDVEN  --  0.9  --   --   --   --     Acute hypoxic respiratory failure due to pneumonia: Due to pneumonia and pleural effusion as above.  Wean oxygen as tolerated to room air.    AKI on CKD 3B: Baseline creatinine 1.2-1.3, on losartan, AKI resolved.  Off IV fluids. Recent Labs    02/07/23 1541 05/17/23 1303 05/18/23 0158 05/19/23 0343 05/20/23 0322 05/21/23 0327  BUN 25* 37* 40* 31* 25* 21  CREATININE 1.31* 1.17* 1.50* 1.12* 0.99 0.96  CO2 26 25 24  21* 24 24  K 3.9 3.0* 3.3* 4.4 4.3 4.4    New onset A-fib: Likely from sepsis, TSH normal, now started on Toprol not on anticoagulation due to fall risk after discussing risk benefits with patient's sons by previous provider, follow-up echocardiogram is stable.  Hypokalemia: Resolved  Anemia of renal disease: Hemoglobin being monitored transfuse for less than 7 g continue iron supplement.  Ambulatory dysfunction and generalized weakness deconditioning: PT OT evaluation TOC consult, social worker concerned about patient being home alone  Goals of care: DNR, severely diminished hearing   DVT prophylaxis: heparin injection 5,000 Units Start: 05/18/23 2200 SCDs Start: 05/17/23 1411 Code Status:   Code Status: Do not attempt resuscitation (DNR) PRE-ARREST INTERVENTIONS DESIRED Family Communication: plan of care  SPECREQUEST  05/17/2023 2049    BOTTLES DRAWN AEROBIC AND ANAEROBIC Blood Culture adequate volume Performed at [facility], [address] [place], [place] [address]    CULT  05/17/2023 2049    NO GROWTH 3 DAYS Performed at [facility], [address], [place], [place] [address]    REPTSTATUS PENDING 05/17/2023 2049    Other culture-see note  Radiology Studies: CT CHEST WO CONTRAST  Result Date: 05/20/2023 CLINICAL DATA:  Pneumonia complications suspected, pleural effusion EXAM: CT CHEST WITHOUT CONTRAST TECHNIQUE: Multidetector CT  imaging of the chest was performed following the standard protocol without IV contrast. RADIATION DOSE REDUCTION: This exam was performed according to the departmental dose-optimization program which includes automated exposure control, adjustment of the mA and/or kV according to patient size and/or use of iterative reconstruction technique. COMPARISON:  Lumbar spine CT dated June 28, 2019; thoracic spine radiograph dated 25th 2022 FINDINGS: Cardiovascular: Cardiomegaly. Trace pericardial effusion. Severe atherosclerotic disease of the thoracic aorta. Area tortuosity and narrowing involving the mid descending thoracic aorta. Severe coronary artery calcifications. Mediastinum/Nodes: Esophagus and thyroid are unremarkable. No enlarged lymph nodes seen in the chest. Lungs/Pleura: Central airways are patent. Moderate right and small left pleural effusions, including a loculated fissural component. Within limitations of noncontrast exam, no evidence of pleural thickening. Complete collapse of the right lower lobe and partial collapse of the right middle lobe and left lower lobe. No pneumothorax. Upper Abdomen: No acute abnormality. Musculoskeletal: Age-indeterminate severe compression deformity T9. Additional severe compression deformity of T12 with vertebroplasty changes. Moderate age-indeterminate deformity of the superior endplate of L1, unchanged when compared with the prior lumbar spine CT. IMPRESSION: 1. Moderate right and small left pleural effusions, including a loculated right-sided fissural component. 2. Complete collapse of the right lower lobe and partial collapse of the right middle lobe and left lower lobe secondary to pleural effusions. 3. Age-indeterminate severe compression deformity of T9. Correlate for point tenderness. 4. Severe aortic Atherosclerosis (ICD10-I70.0) with area of tortuosity and focal narrowing involving the descending thoracic aorta, likely unchanged when compared with calcification  pattern on prior thoracic spine radiograph dated Dec 14, 2020, although lack of IV contrast limits evaluation. Electronically Signed   By: Allegra Lai M.D.   On: 05/20/2023 16:09   ECHOCARDIOGRAM COMPLETE  Result Date: 05/19/2023    ECHOCARDIOGRAM REPORT   Patient Name:   Alicia Schwartz Date of Exam: 05/19/2023 Medical Rec #:  [id number]           Height:       59.0 in Accession #:    [id number]          Weight:       104.3 lb Date of Birth:  [birth year]            BSA:          1.398 m Patient Age:    89 years            BP:           141/72 mmHg Patient Gender: F                   HR:           97 bpm. Exam Location:  Inpatient Procedure: 2D Echo, Cardiac Doppler and Color Doppler Indications:    Bacteremia R78.81                 Atrial Fibrillation I48.91  History:  SPECREQUEST  05/17/2023 2049    BOTTLES DRAWN AEROBIC AND ANAEROBIC Blood Culture adequate volume Performed at [facility], [address] [place], [place] [address]    CULT  05/17/2023 2049    NO GROWTH 3 DAYS Performed at [facility], [address], [place], [place] [address]    REPTSTATUS PENDING 05/17/2023 2049    Other culture-see note  Radiology Studies: CT CHEST WO CONTRAST  Result Date: 05/20/2023 CLINICAL DATA:  Pneumonia complications suspected, pleural effusion EXAM: CT CHEST WITHOUT CONTRAST TECHNIQUE: Multidetector CT  imaging of the chest was performed following the standard protocol without IV contrast. RADIATION DOSE REDUCTION: This exam was performed according to the departmental dose-optimization program which includes automated exposure control, adjustment of the mA and/or kV according to patient size and/or use of iterative reconstruction technique. COMPARISON:  Lumbar spine CT dated June 28, 2019; thoracic spine radiograph dated 25th 2022 FINDINGS: Cardiovascular: Cardiomegaly. Trace pericardial effusion. Severe atherosclerotic disease of the thoracic aorta. Area tortuosity and narrowing involving the mid descending thoracic aorta. Severe coronary artery calcifications. Mediastinum/Nodes: Esophagus and thyroid are unremarkable. No enlarged lymph nodes seen in the chest. Lungs/Pleura: Central airways are patent. Moderate right and small left pleural effusions, including a loculated fissural component. Within limitations of noncontrast exam, no evidence of pleural thickening. Complete collapse of the right lower lobe and partial collapse of the right middle lobe and left lower lobe. No pneumothorax. Upper Abdomen: No acute abnormality. Musculoskeletal: Age-indeterminate severe compression deformity T9. Additional severe compression deformity of T12 with vertebroplasty changes. Moderate age-indeterminate deformity of the superior endplate of L1, unchanged when compared with the prior lumbar spine CT. IMPRESSION: 1. Moderate right and small left pleural effusions, including a loculated right-sided fissural component. 2. Complete collapse of the right lower lobe and partial collapse of the right middle lobe and left lower lobe secondary to pleural effusions. 3. Age-indeterminate severe compression deformity of T9. Correlate for point tenderness. 4. Severe aortic Atherosclerosis (ICD10-I70.0) with area of tortuosity and focal narrowing involving the descending thoracic aorta, likely unchanged when compared with calcification  pattern on prior thoracic spine radiograph dated Dec 14, 2020, although lack of IV contrast limits evaluation. Electronically Signed   By: Allegra Lai M.D.   On: 05/20/2023 16:09   ECHOCARDIOGRAM COMPLETE  Result Date: 05/19/2023    ECHOCARDIOGRAM REPORT   Patient Name:   Alicia Schwartz Date of Exam: 05/19/2023 Medical Rec #:  [id number]           Height:       59.0 in Accession #:    [id number]          Weight:       104.3 lb Date of Birth:  [birth year]            BSA:          1.398 m Patient Age:    89 years            BP:           141/72 mmHg Patient Gender: F                   HR:           97 bpm. Exam Location:  Inpatient Procedure: 2D Echo, Cardiac Doppler and Color Doppler Indications:    Bacteremia R78.81                 Atrial Fibrillation I48.91  History:

## 2023-05-21 NOTE — [place] FL2 (Signed)
[place] MEDICAID FL2 LEVEL OF CARE FORM     IDENTIFICATION  Patient Name: Alicia Schwartz Birthdate: 28-Jan-1931 Sex: female Admission Date (Current Location): 05/17/2023  [facility] and [place] Number:  Producer, television/film/video and Address:  [facility],  [address], [place] [address]      Provider Number: [id number]  Attending Physician Name and Address:  Lanae Boast, MD  Relative Name and Phone Number:  [name] Belkin(Son) [phone]    Current Level of Care: Hospital Recommended Level of Care: Skilled Nursing Facility Prior Approval Number:    Date Approved/Denied:   PASRR Number: [id number]  Discharge Plan: SNF    Current Diagnoses: Patient Active Problem List   Diagnosis Date Noted   Sepsis due to pneumonia (HCC) 05/17/2023   Elevated TSH 02/26/2022   Normocytic anemia 02/24/2022   Hypokalemia    Physical deconditioning    AKI (acute kidney injury) (HCC) 02/23/2022   Lower extremity edema 01/26/2022   Closed T12 fracture (HCC) 07/04/2019   Hypertensive urgency 07/04/2019   Back pain 07/04/2019   Pressure injury of skin 07/04/2019   Postoperative anemia due to acute blood loss 02/14/2015   Paroxysmal supraventricular tachycardia (HCC) 02/12/2015   Fracture, subtrochanteric, right femur, closed (HCC) 02/11/2015   Fall due to stumbling 02/11/2015   Anxiety state 09/19/2006   HTN (hypertension) 09/19/2006    Orientation RESPIRATION BLADDER Height & Weight     Self  O2 Continent Weight: 47.3 kg Height:  4\' 11"  (149.9 cm)  BEHAVIORAL SYMPTOMS/MOOD NEUROLOGICAL BOWEL NUTRITION STATUS      Continent Diet (Regular thin)  AMBULATORY STATUS COMMUNICATION OF NEEDS Skin   Limited Assist Verbally Normal                       Personal Care Assistance Level of Assistance  Bathing, Feeding, Dressing Bathing Assistance: Limited assistance Feeding assistance: Limited assistance Dressing Assistance: Limited assistance     Functional  Limitations Info  Sight, Hearing, Speech Sight Info: Impaired (eyeglasses) Hearing Info: Impaired (Bilateral hearing aids) Speech Info: Impaired (Regular thin)    SPECIAL CARE FACTORS FREQUENCY  PT (By licensed PT), OT (By licensed OT)     PT Frequency: 5x week OT Frequency: 5x week            Contractures Contractures Info: Not present    Additional Factors Info  Code Status, Allergies Code Status Info: DNR Allergies Info: NKDA           Current Medications (05/21/2023):  This is the current hospital active medication list Current Facility-Administered Medications  Medication Dose Route Frequency Provider Last Rate Last Admin   acetaminophen (TYLENOL) tablet 650 mg  650 mg Oral Q6H PRN Kirby Crigler, Mir M, MD   650 mg at 05/20/23 2128   Or   acetaminophen (TYLENOL) suppository 650 mg  650 mg Rectal Q6H PRN Kirby Crigler, Mir M, MD       albuterol (PROVENTIL) (2.5 MG/3ML) 0.083% nebulizer solution 2.5 mg  2.5 mg Nebulization Q2H PRN Kirby Crigler, Mir M, MD   2.5 mg at 05/19/23 0639   amoxicillin-clavulanate (AUGMENTIN) 500-125 MG per tablet 1 tablet  1 tablet Oral BID Vu, Trung T, MD   1 tablet at 05/21/23 0831   Chlorhexidine Gluconate Cloth 2 % PADS 6 each  6 each Topical Daily Maryln Gottron, MD   6 each at 05/21/23 3244   ferrous sulfate tablet 325 mg  325 mg Oral Q breakfast Candelaria Stagers  T, MD   325 mg at 05/21/23 0831   guaiFENesin-dextromethorphan (ROBITUSSIN DM) 100-10 MG/5ML syrup 5 mL  5 mL Oral Q4H PRN Kirby Crigler, Mir M, MD   5 mL at 05/20/23 2129   heparin injection 5,000 Units  5,000 Units Subcutaneous Q8H Candelaria Stagers T, MD   5,000 Units at 05/21/23 0509   lip balm (CARMEX) ointment 1 Application  1 Application Topical PRN Kirby Crigler, Mir M, MD       LORazepam (ATIVAN) tablet 0.5 mg  0.5 mg Oral QHS Candelaria Stagers T, MD   0.5 mg at 05/20/23 2129   metoprolol succinate (TOPROL-XL) 24 hr tablet 25 mg  25 mg Oral Daily Candelaria Stagers T, MD   25 mg at 05/21/23 0831    multivitamin with minerals tablet 1 tablet  1 tablet Oral Daily Candelaria Stagers T, MD   1 tablet at 05/21/23 0831   senna-docusate (Senokot-S) tablet 1 tablet  1 tablet Oral QHS PRN Kirby Crigler, Mir M, MD       sertraline (ZOLOFT) tablet 25 mg  25 mg Oral Daily Candelaria Stagers T, MD   25 mg at 05/21/23 0831   sodium chloride (OCEAN) 0.65 % nasal spray 1 spray  1 spray Each Nare PRN Kirby Crigler, Mir M, MD       sodium chloride flush (NS) 0.9 % injection 10-40 mL  10-40 mL Intracatheter Q12H Linwood Dibbles, MD   10 mL at 05/21/23 5409   sodium chloride flush (NS) 0.9 % injection 10-40 mL  10-40 mL Intracatheter PRN Linwood Dibbles, MD         Discharge Medications: Please see discharge summary for a list of discharge medications.  Relevant Imaging Results:  Relevant Lab Results:   Additional Information [id number], [place], [place]

## 2023-05-21 NOTE — Consult Note (Signed)
NAME:  Alicia Schwartz, MRN:  [id number], DOB:  1930/11/04, LOS: 4 ADMISSION DATE:  05/17/2023, CONSULTATION DATE:  05/21/23 REFERRING MD:  Rutha Bouchard, MD CHIEF COMPLAINT:  Pleural Effusion   History of Present Illness:  Alicia Schwartz is a 89 year old woman with hypertension, SVT and hearing loss who is admitted with sepsis due to streptococcus pneumoniae bacteremia and pneumonia with right pleural.   PCCM consulted for evaluation of pleural effusion. CT Chest 10/28 showed moderate right effusion including a loculated fissural component. Complete collapse of RLL.   Patient very hard of hearing, difficult to obtain history. Spoke with son, [name] via phone.   Pertinent  Medical History   Past Medical History:  Diagnosis Date   Fracture, subtrochanteric, right femur, closed (HCC) 02/11/2015   Hypertension    Paroxysmal supraventricular tachycardia (HCC) 02/12/2015     Significant Hospital Events: Including procedures, antibiotic start and stop dates in addition to other pertinent events   10/25 admitted 10/29 PCCM consult for right pleural effusion  Interim History / Subjective:  Patient's son coming in today with hearing aids and to discuss chest tube procedure  Objective   Blood pressure (!) 151/76, pulse 93, temperature (!) 97.5 F (36.4 C), temperature source Oral, resp. rate 18, height 4\' 11"  (1.499 m), weight 47.3 kg, SpO2 97%.        Intake/Output Summary (Last 24 hours) at 05/21/2023 0756 Last data filed at 05/20/2023 2134 Gross per 24 hour  Intake 240 ml  Output --  Net 240 ml   Filed Weights   05/17/23 1004 05/17/23 2003  Weight: 46.9 kg 47.3 kg    Examination: General: elderly woman, hard of hearing, no distress HENT: [place]/AT, moist mucous membranes Lungs: diminished right base, no wheezing Cardiovascular: rrr, no murmurs Abdomen: soft, non-tender, non-distended Extremities: warm, no edema Neuro: alert, moving all extremities GU: n/a  Resolved Hospital  Problem list     Assessment & Plan:  Sepsis due to bacteremia and pneumonia, Strep pneumoniae Acute hypoxemic respiratory failure Right Pleural Effusion  Plan: - recommend chest tube placement given bacteremia for clearance of effusion and reduce the risk of trapped lung - will discuss procedure with son when he comes to hospital today, patient very hard of hearing - continue antibiotics per ID  Best Practice (right click and "Reselect all SmartList Selections" daily)   Per primary team  Labs   CBC: Recent Labs  Lab 05/17/23 1303 05/18/23 0158 05/19/23 0343 05/20/23 0322 05/21/23 0327  WBC 27.1* 24.3* 18.2* 18.6* 18.0*  NEUTROABS 24.5*  --  15.4*  --   --   HGB 10.8* 9.9* 9.8* 9.2* 9.0*  HCT 30.1* 28.1* 28.5* 27.5* 27.0*  MCV 98.0 99.3 100.4* 103.4* 103.1*  PLT 279 272 290 331 333    Basic Metabolic Panel: Recent Labs  Lab 05/17/23 1303 05/18/23 0158 05/19/23 0343 05/20/23 0322 05/21/23 0327  NA 133* 137 135 136 135  K 3.0* 3.3* 4.4 4.3 4.4  CL 96* 100 104 107 106  CO2 25 24 21* 24 24  GLUCOSE 125* 107* 135* 96 98  BUN 37* 40* 31* 25* 21  CREATININE 1.17* 1.50* 1.12* 0.99 0.96  CALCIUM 8.9 8.8* 8.4* 8.1* 8.0*  MG 2.1  --  2.1 2.2  --   PHOS  --   --  1.8* 4.1  --    GFR: Estimated Creatinine Clearance: 26.6 mL/min (by C-G formula based on SCr of 0.96 mg/dL). Recent Labs  Lab 05/17/23 1304 05/18/23  0158 05/19/23 0343 05/20/23 0322 05/21/23 0327  WBC  --  24.3* 18.2* 18.6* 18.0*  LATICACIDVEN 0.9  --   --   --   --     Liver Function Tests: Recent Labs  Lab 05/17/23 1303 05/19/23 0343 05/20/23 0322  AST 19  --   --   ALT 16  --   --   ALKPHOS 127*  --   --   BILITOT 1.4*  --   --   PROT 6.5  --   --   ALBUMIN 2.8* 2.4* 2.2*   No results for input(s): "LIPASE", "AMYLASE" in the last 168 hours. No results for input(s): "AMMONIA" in the last 168 hours.  ABG No results found for: "PHART", "PCO2ART", "PO2ART", "HCO3", "TCO2", "ACIDBASEDEF",  "O2SAT"   Coagulation Profile: No results for input(s): "INR", "PROTIME" in the last 168 hours.  Cardiac Enzymes: No results for input(s): "CKTOTAL", "CKMB", "CKMBINDEX", "TROPONINI" in the last 168 hours.  HbA1C: No results found for: "HGBA1C"  CBG: Recent Labs  Lab 05/19/23 1129  GLUCAP 150*    Review of Systems:   Unable to perform review of systems due to hearing impairment  Past Medical History:  She,  has a past medical history of Fracture, subtrochanteric, right femur, closed (HCC) (02/11/2015), Hypertension, and Paroxysmal supraventricular tachycardia (HCC) (02/12/2015).   Surgical History:   Past Surgical History:  Procedure Laterality Date   ABDOMINAL HYSTERECTOMY     INTRAMEDULLARY (IM) NAIL INTERTROCHANTERIC Right 02/12/2015   Procedure: INTRAMEDULLARY (IM) NAIL INTERTROCHANTRIC;  Surgeon: Teryl Lucy, MD;  Location: [facility] OR;  Service: Orthopedics;  Laterality: Right;   IR VERTEBROPLASTY CERV/THOR BX INC UNI/BIL INC/INJECT/IMAGING  07/08/2019     Social History:   reports that she has never smoked. She has never used smokeless tobacco. She reports that she does not drink alcohol.   Family History:  Her family history includes Alzheimer's disease in her mother; Prostate cancer in her father.   Allergies No Known Allergies   Home Medications  Prior to Admission medications   Medication Sig Start Date End Date Taking? Authorizing Provider  acetaminophen (TYLENOL) 325 MG tablet Take 2 tablets (650 mg total) by mouth every 6 (six) hours as needed for mild pain (or Fever >/= 101). 07/12/19  Yes Calvert Cantor, MD  LORazepam (ATIVAN) 0.5 MG tablet Take 0.5 mg by mouth at bedtime. 05/16/23  Yes [provider]  losartan (COZAAR) 25 MG tablet Take 3 tablets (75 mg total) by mouth daily. Patient taking differently: Take 25 mg by mouth in the morning and at bedtime. 02/27/22  Yes Lewie Chamber, MD  metoprolol succinate (TOPROL-XL) 25 MG 24 hr tablet Take 1  tablet (25 mg total) by mouth daily. 02/27/22  Yes Lewie Chamber, MD  Multiple Vitamin (MULTIVITAMIN) tablet Take 1 tablet by mouth daily.   Yes [provider]  sertraline (ZOLOFT) 25 MG tablet Take 25 mg by mouth daily.   Yes [provider]  ferrous sulfate 325 (65 FE) MG tablet Take 1 tablet (325 mg total) by mouth daily with breakfast. Patient not taking: Reported on 05/17/2023 02/27/22   Lewie Chamber, MD     Critical care time: n/a    Melody Comas, MD  Pulmonary & Critical Care Office: [phone]   See Amion for personal pager PCCM on call pager [phone] until 7pm. Please call Elink 7p-7a. [phone]

## 2023-05-22 DIAGNOSIS — A419 Sepsis, unspecified organism: Secondary | ICD-10-CM | POA: Diagnosis not present

## 2023-05-22 DIAGNOSIS — J90 Pleural effusion, not elsewhere classified: Secondary | ICD-10-CM

## 2023-05-22 DIAGNOSIS — J189 Pneumonia, unspecified organism: Secondary | ICD-10-CM | POA: Diagnosis not present

## 2023-05-22 DIAGNOSIS — D72829 Elevated white blood cell count, unspecified: Secondary | ICD-10-CM | POA: Diagnosis not present

## 2023-05-22 DIAGNOSIS — R7881 Bacteremia: Secondary | ICD-10-CM

## 2023-05-22 LAB — CBC
HCT: 29.4 % — ABNORMAL LOW (ref 36.0–46.0)
Hemoglobin: 9.6 g/dL — ABNORMAL LOW (ref 12.0–15.0)
MCH: 33.9 pg (ref 26.0–34.0)
MCHC: 32.7 g/dL (ref 30.0–36.0)
MCV: 103.9 fL — ABNORMAL HIGH (ref 80.0–100.0)
Platelets: 383 10*3/uL (ref 150–400)
RBC: 2.83 MIL/uL — ABNORMAL LOW (ref 3.87–5.11)
RDW: 13.6 % (ref 11.5–15.5)
WBC: 18.2 10*3/uL — ABNORMAL HIGH (ref 4.0–10.5)
nRBC: 0 % (ref 0.0–0.2)

## 2023-05-22 LAB — CULTURE, BLOOD (ROUTINE X 2)
Culture: NO GROWTH
Special Requests: ADEQUATE

## 2023-05-22 LAB — BASIC METABOLIC PANEL
Anion gap: 6 (ref 5–15)
BUN: 18 mg/dL (ref 8–23)
CO2: 23 mmol/L (ref 22–32)
Calcium: 8 mg/dL — ABNORMAL LOW (ref 8.9–10.3)
Chloride: 105 mmol/L (ref 98–111)
Creatinine, Ser: 0.93 mg/dL (ref 0.44–1.00)
GFR, Estimated: 58 mL/min — ABNORMAL LOW (ref >60)
Glucose, Bld: 94 mg/dL (ref 70–99)
Potassium: 4.2 mmol/L (ref 3.5–5.1)
Sodium: 134 mmol/L — ABNORMAL LOW (ref 135–145)

## 2023-05-22 MED ORDER — ALTEPLASE 2 MG IJ SOLR
2.0000 mg | Freq: Once | INTRAMUSCULAR | Status: DC
  Filled 2023-05-22: qty 2

## 2023-05-22 NOTE — Progress Notes (Signed)
NAME:  Alicia Schwartz, MRN:  [id number], DOB:  1930/10/09, LOS: 5 ADMISSION DATE:  05/17/2023, CONSULTATION DATE:  05/21/23 REFERRING MD:  Rutha Bouchard, MD CHIEF COMPLAINT:  Pleural Effusion   History of Present Illness:  Alicia Schwartz is a 89 year old woman with hypertension, SVT and hearing loss who is admitted with sepsis due to streptococcus pneumoniae bacteremia and pneumonia with right pleural.   PCCM consulted for evaluation of pleural effusion. CT Chest 10/28 showed moderate right effusion including a loculated fissural component. Complete collapse of RLL.   Patient very hard of hearing, difficult to obtain history. Spoke with son, Alicia Schwartz via phone.   Pertinent  Medical History   Past Medical History:  Diagnosis Date   Fracture, subtrochanteric, right femur, closed (HCC) 02/11/2015   Hypertension    Paroxysmal supraventricular tachycardia (HCC) 02/12/2015     Significant Hospital Events: Including procedures, antibiotic start and stop dates in addition to other pertinent events   10/25 admitted 10/29 PCCM consult for right pleural effusion, unable to perform thora at bedside due to positioning and unsafe windo, effusion appeared simple  Interim History / Subjective:   No acute events overnight Patient does not feel well   Objective   Blood pressure (!) 153/85, pulse 80, temperature 98.4 F (36.9 C), temperature source Oral, resp. rate 16, height 4\' 11"  (1.499 m), weight 47.3 kg, SpO2 97%.        Intake/Output Summary (Last 24 hours) at 05/22/2023 0830 Last data filed at 05/21/2023 1821 Gross per 24 hour  Intake 60 ml  Output --  Net 60 ml   Filed Weights   05/17/23 1004 05/17/23 2003  Weight: 46.9 kg 47.3 kg    Examination: General: elderly woman, hard of hearing, no distress HENT: /AT, moist mucous membranes Lungs: diminished right base, no wheezing Cardiovascular: rrr, no murmurs Abdomen: soft, non-tender, non-distended Extremities: warm, no  edema Neuro: alert, moving all extremities GU: n/a  Resolved Hospital Problem list     Assessment & Plan:  Sepsis due to bacteremia and pneumonia, Strep pneumoniae Acute hypoxemic respiratory failure Right Pleural Effusion  Plan: - discussed placing chest tube vs thoracentesis with patient's son yesterday, he agreed to a thoracentesis but unable to do procedure due to positioning - Will monitor effusion with daily bedside US as it appeared simple yesterday. If able to get in better upright position today will perform thoracentesis - continue antibiotics per ID  Best Practice (right click and "Reselect all SmartList Selections" daily)   Per primary team  Labs   CBC: Recent Labs  Lab 05/17/23 1303 05/18/23 0158 05/19/23 0343 05/20/23 0322 05/21/23 0327 05/22/23 0323  WBC 27.1* 24.3* 18.2* 18.6* 18.0* 18.2*  NEUTROABS 24.5*  --  15.4*  --   --   --   HGB 10.8* 9.9* 9.8* 9.2* 9.0* 9.6*  HCT 30.1* 28.1* 28.5* 27.5* 27.0* 29.4*  MCV 98.0 99.3 100.4* 103.4* 103.1* 103.9*  PLT 279 272 290 331 333 383    Basic Metabolic Panel: Recent Labs  Lab 05/17/23 1303 05/18/23 0158 05/19/23 0343 05/20/23 0322 05/21/23 0327 05/22/23 0323  NA 133* 137 135 136 135 134*  K 3.0* 3.3* 4.4 4.3 4.4 4.2  CL 96* 100 104 107 106 105  CO2 25 24 21* 24 24 23   GLUCOSE 125* 107* 135* 96 98 94  BUN 37* 40* 31* 25* 21 18  CREATININE 1.17* 1.50* 1.12* 0.99 0.96 0.93  CALCIUM 8.9 8.8* 8.4* 8.1* 8.0* 8.0*  MG 2.1  --  2.1 2.2  --   --   PHOS  --   --  1.8* 4.1  --   --    GFR: Estimated Creatinine Clearance: 27.4 mL/min (by C-G formula based on SCr of 0.93 mg/dL). Recent Labs  Lab 05/17/23 1304 05/18/23 0158 05/19/23 0343 05/20/23 0322 05/21/23 0327 05/22/23 0323  WBC  --    < > 18.2* 18.6* 18.0* 18.2*  LATICACIDVEN 0.9  --   --   --   --   --    < > = values in this interval not displayed.    Liver Function Tests: Recent Labs  Lab 05/17/23 1303 05/19/23 0343 05/20/23 0322   AST 19  --   --   ALT 16  --   --   ALKPHOS 127*  --   --   BILITOT 1.4*  --   --   PROT 6.5  --   --   ALBUMIN 2.8* 2.4* 2.2*   No results for input(s): "LIPASE", "AMYLASE" in the last 168 hours. No results for input(s): "AMMONIA" in the last 168 hours.  ABG No results found for: "PHART", "PCO2ART", "PO2ART", "HCO3", "TCO2", "ACIDBASEDEF", "O2SAT"   Coagulation Profile: No results for input(s): "INR", "PROTIME" in the last 168 hours.  Cardiac Enzymes: No results for input(s): "CKTOTAL", "CKMB", "CKMBINDEX", "TROPONINI" in the last 168 hours.  HbA1C: No results found for: "HGBA1C"  CBG: Recent Labs  Lab 05/19/23 1129 05/21/23 1055  GLUCAP 150* 97    Critical care time: n/a    Melody Comas, MD [place] Pulmonary & Critical Care Office: [phone]   See Amion for personal pager PCCM on call pager [phone] until 7pm. Please call Elink 7p-7a. [phone]

## 2023-05-22 NOTE — Progress Notes (Signed)
PROGRESS NOTE Alicia Schwartz  BMW:[id number] DOB: 11/20/1930 DOA: 05/17/2023 PCP: Linus Galas, NP  Brief Narrative/Hospital Course: 89 year old F with PMH of paroxysmal SVT, HTN and diminished hearing brought to ED after a social worker called EMS due to generalized weakness, acute on chronic low back pain and ambulatory dysfunction.  Reportedly saturating at 88% when EMS arrived.  Other vitals were okay.  She has leukocytosis to 27 with left shift.  CXR with small right-sided pleural effusion and possible consolidation versus mass.  Blood cultures obtained.  Patient was started on IV ceftriaxone and azithromycin, and admitted for severe sepsis due to community-acquired pneumonia.   The next day, blood culture with Streptococcus pneumonia in 1 out of 2 bottles.  Azithromycin discontinued.  ID consulted, advised CT to evaluate pleural effusion and continue ceftriaxone ID following closely, CT chest 10/28 showed: Moderate right and small left pleural effusion including loculated right-sided fissural component complete collapse of the right lower lobe and partial collapse RML and left lower lobe due to pleural effusion-PCCM consulted: Recommended chest tube placement given bacteremia for clearance of infusing and reduce the risk of trapped lung.  Subjective: Patient seen and examined this morning She is alert awake resting comfortably She is hard of hearing On nasal cannula oxygen Past 24 hours Afebrile BP stable labs shows persistent leukocytosis at 18k   Assessment and Plan: Principal Problem:   Sepsis due to pneumonia [facility]) Active Problems:   Pleural effusion   Bacteremia   Severe sepsis due to pneumonia and Streptococcus pneumoniae bacteremia CAP Loculated right-sided/Moderate right and small left pleural effusion: Blood culture with Streptococcus pneumoniae-source felt to be community-acquired pneumonia T chest 10/28 showed: Moderate right and small left pleural effusion including  loculated right-sided fissural component complete collapse of the right lower lobe and partial collapse RML and left lower lobe due to pleural effusion-PCCM consulted: Recommended chest tube placement given bacteremia for clearance of infusing and reduce the risk of trapped lung. Continue mucolytic's bronchodilators aspiration precaution, SLP following  on regular diet.  TTE- EF 50-55%, no RWMA, mitral valve grossly normal, aortic valve tricuspid valve Antibiotic de-escalated to Augmentin, ID following, continue same, continue supplemental oxygen. Patient with persistent leukocytosis will need chest tube- PCCM discussed with patient's son, agreed for thoracentesis but unable to do procedure due to positioning-now  monitoring daily w/ [place] She is at risk of decompensation further mortality and morbidity after discussion with pulmonary and ID. Recent Labs  Lab 05/17/23 1304 05/18/23 0158 05/19/23 0343 05/20/23 0322 05/21/23 0327 05/22/23 0323  WBC  --  24.3* 18.2* 18.6* 18.0* 18.2*  LATICACIDVEN 0.9  --   --   --   --   --     Acute hypoxic respiratory failure due to pneumonia: Due to CAP, and pleural effusion as above.  Wean oxygen as tolerated to room air.    AKI on CKD 3B: Baseline creatinine 1.2-1.3, on losartan, AKI resolved.  Off IV fluids. monitor Recent Labs    02/07/23 1541 05/17/23 1303 05/18/23 0158 05/19/23 0343 05/20/23 0322 05/21/23 0327 05/22/23 0323  BUN 25* 37* 40* 31* 25* 21 18  CREATININE 1.31* 1.17* 1.50* 1.12* 0.99 0.96 0.93  CO2 26 25 24  21* 24 24 23   K 3.9 3.0* 3.3* 4.4 4.3 4.4 4.2    New onset A-fib: Likely from sepsis, TSH normal, now started on Toprol not on anticoagulation due to fall risk after discussing risk benefits with patient's sons by previous provider, follow-up echocardiogram is stable.  Hypokalemia:  Resolved  Anemia of renal disease: Hemoglobin being monitored transfuse for less than 7 g continue iron supplement.  Ambulatory dysfunction  and generalized weakness deconditioning: PT OT evaluation TOC consult, social worker concerned about patient being home alone  Goals of care: DNR, severely diminished hearing.  Palliative care has also been consulted.  DVT prophylaxis: heparin injection 5,000 Units Start: 05/18/23 2200 SCDs Start: 05/17/23 1411 Code Status:   Code Status: Do not attempt resuscitation (DNR) PRE-ARREST INTERVENTIONS DESIRED Family Communication: plan of care discussed with patient at bedside. Patient status is: Inpatient because of pneumonia Level of care: Telemetry   Dispo: The patient is from: home            Anticipated disposition: TBD. PTOT recommending skilled nursing facility. Objective: Vitals last 24 hrs: Vitals:   05/21/23 0830 05/21/23 1222 05/21/23 1954 05/22/23 0348  BP: (!) 144/90 127/64 (!) 150/72 (!) 153/85  Pulse: 74 84 90 80  Resp:  20 17 16   Temp: 98.2 F (36.8 C) 97.7 F (36.5 C) 98.3 F (36.8 C) 98.4 F (36.9 C)  TempSrc: Oral Oral Oral Oral  SpO2: 97% 95% 97% 97%  Weight:      Height:       Weight change:   Physical Examination: General exam: alert awake, very hard of hearing, on [place]. HEENT:Oral mucosa moist, Ear/Nose WNL grossly Respiratory system: Bilaterally clear BS,no use of accessory muscle Cardiovascular system: S1 & S2 +, No JVD. Gastrointestinal system: Abdomen soft,NT,ND, BS+ Nervous System: Alert, awake, moving all extremities,and following commands. Extremities: LE edema neg,distal peripheral pulses palpable and warm.  Skin: No rashes,no icterus. MSK: Normal muscle bulk,tone, power   Medications reviewed:  Scheduled Meds:  amoxicillin-clavulanate  1 tablet Oral BID   Chlorhexidine Gluconate Cloth  6 each Topical Daily   ferrous sulfate  325 mg Oral Q breakfast   heparin injection (subcutaneous)  5,000 Units Subcutaneous Q8H   LORazepam  0.5 mg Oral QHS   metoprolol succinate  25 mg Oral Daily   multivitamin with minerals  1 tablet Oral Daily    sertraline  25 mg Oral Daily   sodium chloride flush  10-40 mL Intracatheter Q12H   Continuous Infusions:  Diet Order             Diet regular Room service appropriate? Yes; Fluid consistency: Thin  Diet effective now                  Intake/Output Summary (Last 24 hours) at 05/22/2023 1044 Last data filed at 05/21/2023 1821 Gross per 24 hour  Intake 60 ml  Output --  Net 60 ml   Net IO Since Admission: 906.15 mL [05/22/23 1044]  Wt Readings from Last 3 Encounters:  05/17/23 47.3 kg  02/24/22 46.9 kg  01/26/22 47.6 kg     Unresulted Labs (From admission, onward)     Start     Ordered   05/21/23 0500  CBC  Daily,   R     Question:  Specimen collection method  Answer:  IV Team=IV Team collect   05/20/23 1256   05/21/23 0500  Basic metabolic panel  Daily,   R     Question:  Specimen collection method  Answer:  IV Team=IV Team collect   05/20/23 1256          Data Reviewed: I have personally reviewed following labs and imaging studies CBC: Recent Labs  Lab 05/17/23 1303 05/18/23 0158 05/19/23 0343 05/20/23 0322 05/21/23 0327 05/22/23 6578  WBC 27.1* 24.3* 18.2* 18.6* 18.0* 18.2*  NEUTROABS 24.5*  --  15.4*  --   --   --   HGB 10.8* 9.9* 9.8* 9.2* 9.0* 9.6*  HCT 30.1* 28.1* 28.5* 27.5* 27.0* 29.4*  MCV 98.0 99.3 100.4* 103.4* 103.1* 103.9*  PLT 279 272 290 331 333 383   Basic Metabolic Panel: Recent Labs  Lab 05/17/23 1303 05/18/23 0158 05/19/23 0343 05/20/23 0322 05/21/23 0327 05/22/23 0323  NA 133* 137 135 136 135 134*  K 3.0* 3.3* 4.4 4.3 4.4 4.2  CL 96* 100 104 107 106 105  CO2 25 24 21* 24 24 23   GLUCOSE 125* 107* 135* 96 98 94  BUN 37* 40* 31* 25* 21 18  CREATININE 1.17* 1.50* 1.12* 0.99 0.96 0.93  CALCIUM 8.9 8.8* 8.4* 8.1* 8.0* 8.0*  MG 2.1  --  2.1 2.2  --   --   PHOS  --   --  1.8* 4.1  --   --    GFR: Estimated Creatinine Clearance: 27.4 mL/min (by C-G formula based on SCr of 0.93 mg/dL). Liver Function Tests: Recent Labs  Lab  05/17/23 1303 05/19/23 0343 05/20/23 0322  AST 19  --   --   ALT 16  --   --   ALKPHOS 127*  --   --   BILITOT 1.4*  --   --   PROT 6.5  --   --   ALBUMIN 2.8* 2.4* 2.2*  CBG: Recent Labs  Lab 05/19/23 1129 05/21/23 1055  GLUCAP 150* 97   Lipid Profile: No results for input(s): "CHOL", "HDL", "LDLCALC", "TRIG", "CHOLHDL", "LDLDIRECT" in the last 72 hours. Thyroid Function Tests: No results for input(s): "TSH", "T4TOTAL", "FREET4", "T3FREE", "THYROIDAB" in the last 72 hours.  Sepsis Labs: Recent Labs  Lab 05/17/23 1304  LATICACIDVEN 0.9    Recent Results (from the past 240 hour(s))  Resp panel by RT-PCR (RSV, Flu A&B, Covid) Anterior Nasal Swab     Status: None   Collection Time: 05/17/23 10:40 AM   Specimen: Anterior Nasal Swab  Result Value Ref Range Status   SARS Coronavirus 2 by RT PCR NEGATIVE NEGATIVE Final    Comment: (NOTE) SARS-CoV-2 target nucleic acids are NOT DETECTED.  The SARS-CoV-2 RNA is generally detectable in upper respiratory specimens during the acute phase of infection. The lowest concentration of SARS-CoV-2 viral copies this assay can detect is 138 copies/mL. A negative result does not preclude SARS-Cov-2 infection and should not be used as the sole basis for treatment or other patient management decisions. A negative result may occur with  improper specimen collection/handling, submission of specimen other than nasopharyngeal swab, presence of viral mutation(s) within the areas targeted by this assay, and inadequate number of viral copies(<138 copies/mL). A negative result must be combined with clinical observations, patient history, and epidemiological information. The expected result is Negative.  Fact Sheet for Patients:  BloggerCourse.com  Fact Sheet for Healthcare Providers:  SeriousBroker.it  This test is no t yet approved or cleared by the [place] FDA and  has been authorized  for detection and/or diagnosis of SARS-CoV-2 by FDA under an Emergency Use Authorization (EUA). This EUA will remain  in effect (meaning this test can be used) for the duration of the COVID-19 declaration under Section 564(b)(1) of the Act, 21 U.S.C.section 360bbb-3(b)(1), unless the authorization is terminated  or revoked sooner.       Influenza A by PCR NEGATIVE NEGATIVE Final   Influenza B by PCR NEGATIVE  NEGATIVE Final    Comment: (NOTE) The Xpert Xpress SARS-CoV-2/FLU/RSV plus assay is intended as an aid in the diagnosis of influenza from Nasopharyngeal swab specimens and should not be used as a sole basis for treatment. Nasal washings and aspirates are unacceptable for Xpert Xpress SARS-CoV-2/FLU/RSV testing.  Fact Sheet for Patients: BloggerCourse.com  Fact Sheet for Healthcare Providers: SeriousBroker.it  This test is not yet approved or cleared by the [place] FDA and has been authorized for detection and/or diagnosis of SARS-CoV-2 by FDA under an Emergency Use Authorization (EUA). This EUA will remain in effect (meaning this test can be used) for the duration of the COVID-19 declaration under Section 564(b)(1) of the Act, 21 U.S.C. section 360bbb-3(b)(1), unless the authorization is terminated or revoked.     Resp Syncytial Virus by PCR NEGATIVE NEGATIVE Final    Comment: (NOTE) Fact Sheet for Patients: BloggerCourse.com  Fact Sheet for Healthcare Providers: SeriousBroker.it  This test is not yet approved or cleared by the [place] FDA and has been authorized for detection and/or diagnosis of SARS-CoV-2 by FDA under an Emergency Use Authorization (EUA). This EUA will remain in effect (meaning this test can be used) for the duration of the COVID-19 declaration under Section 564(b)(1) of the Act, 21 U.S.C. section 360bbb-3(b)(1), unless the authorization is  terminated or revoked.  Performed at [facility], [address], [place], [place] [address]   Blood Culture (routine x 2)     Status: Abnormal   Collection Time: 05/17/23 12:57 PM   Specimen: Neck; Blood  Result Value Ref Range Status   Specimen Description   Final    NECK LEFT Performed at [facility], [address], [place], [place] [address]    Special Requests   Final    BOTTLES DRAWN AEROBIC AND ANAEROBIC Blood Culture adequate volume Performed at [facility], [address] [place], [place] [address]    Culture  Setup Time   Final    GRAM POSITIVE COCCI IN BOTH AEROBIC AND ANAEROBIC BOTTLES CRITICAL RESULT CALLED TO, READ BACK BY AND VERIFIED WITH: L POINDEXTER,PHARMD@0520  05/18/23 MK Performed at [facility], [address] [place], [place] [address]    Culture STREPTOCOCCUS PNEUMONIAE (A)  Final   Report Status 05/20/2023 FINAL  Final   Organism ID, Bacteria STREPTOCOCCUS PNEUMONIAE  Final      Susceptibility   Streptococcus pneumoniae - MIC*    ERYTHROMYCIN >=8 RESISTANT Resistant     LEVOFLOXACIN 0.5 SENSITIVE Sensitive     VANCOMYCIN 0.5 SENSITIVE Sensitive     PENICILLIN (meningitis) 0.5 RESISTANT Resistant     PENO - penicillin 0.5      PENICILLIN (non-meningitis) 0.5 SENSITIVE Sensitive     PENICILLIN (oral) 0.5 INTERMEDIATE Intermediate     CEFTRIAXONE (non-meningitis) 1 SENSITIVE Sensitive     CEFTRIAXONE (meningitis) 1 INTERMEDIATE Intermediate     * STREPTOCOCCUS PNEUMONIAE  Blood Culture ID Panel (Reflexed)     Status: Abnormal   Collection Time: 05/17/23 12:57 PM  Result Value Ref Range Status   Enterococcus faecalis NOT DETECTED NOT DETECTED Final   Enterococcus Faecium NOT DETECTED NOT DETECTED Final   Listeria monocytogenes NOT DETECTED NOT DETECTED Final   Staphylococcus species NOT DETECTED NOT DETECTED Final   Staphylococcus aureus (BCID) NOT DETECTED NOT DETECTED Final    Staphylococcus epidermidis NOT DETECTED NOT DETECTED Final   Staphylococcus lugdunensis NOT DETECTED NOT DETECTED Final   Streptococcus species DETECTED (A) NOT DETECTED  Final    Comment: CRITICAL RESULT CALLED TO, READ BACK BY AND VERIFIED WITH: LPOINDEXTER,PHARMD@0520  05/18/23 MK    Streptococcus agalactiae NOT DETECTED NOT DETECTED Final   Streptococcus pneumoniae DETECTED (A) NOT DETECTED Final    Comment: CRITICAL RESULT CALLED TO, READ BACK BY AND VERIFIED WITH: L POINDEXTER,PHARMD@0520  05/18/23 MK    Streptococcus pyogenes NOT DETECTED NOT DETECTED Final   A.calcoaceticus-baumannii NOT DETECTED NOT DETECTED Final   Bacteroides fragilis NOT DETECTED NOT DETECTED Final   Enterobacterales NOT DETECTED NOT DETECTED Final   Enterobacter cloacae complex NOT DETECTED NOT DETECTED Final   Escherichia coli NOT DETECTED NOT DETECTED Final   Klebsiella aerogenes NOT DETECTED NOT DETECTED Final   Klebsiella oxytoca NOT DETECTED NOT DETECTED Final   Klebsiella pneumoniae NOT DETECTED NOT DETECTED Final   Proteus species NOT DETECTED NOT DETECTED Final   Salmonella species NOT DETECTED NOT DETECTED Final   Serratia marcescens NOT DETECTED NOT DETECTED Final   Haemophilus influenzae NOT DETECTED NOT DETECTED Final   Neisseria meningitidis NOT DETECTED NOT DETECTED Final   Pseudomonas aeruginosa NOT DETECTED NOT DETECTED Final   Stenotrophomonas maltophilia NOT DETECTED NOT DETECTED Final   Candida albicans NOT DETECTED NOT DETECTED Final   Candida auris NOT DETECTED NOT DETECTED Final   Candida glabrata NOT DETECTED NOT DETECTED Final   Candida krusei NOT DETECTED NOT DETECTED Final   Candida parapsilosis NOT DETECTED NOT DETECTED Final   Candida tropicalis NOT DETECTED NOT DETECTED Final   Cryptococcus neoformans/gattii NOT DETECTED NOT DETECTED Final    Comment: Performed at [facility], [address] [place], [place] [address]  Blood Culture (routine x 2)     Status: None    Collection Time: 05/17/23  8:49 PM   Specimen: BLOOD  Result Value Ref Range Status   Specimen Description   Final    BLOOD LEFT ANTECUBITAL Performed at [facility], [address] [place], [place] [address]    Special Requests   Final    BOTTLES DRAWN AEROBIC AND ANAEROBIC Blood Culture adequate volume Performed at [facility], [address], [place], [place] [address]    Culture   Final    NO GROWTH 5 DAYS Performed at [facility], [address], [place], [place] [address]    Report Status 05/22/2023 FINAL  Final    Antimicrobials: Anti-infectives (From admission, onward)    Start     Dose/Rate Route Frequency Ordered Stop   05/21/23 1000  amoxicillin-clavulanate (AUGMENTIN) 500-125 MG per tablet 1 tablet        1 tablet Oral 2 times daily 05/20/23 1028     05/18/23 1500  cefTRIAXone (ROCEPHIN) 1 g in sodium chloride 0.9 % 100 mL IVPB  Status:  Discontinued        1 g 200 mL/hr over 30 Minutes Intravenous Every 24 hours 05/17/23 1412 05/18/23 0542   05/18/23 1200  azithromycin (ZITHROMAX) 500 mg in sodium chloride 0.9 % 250 mL IVPB  Status:  Discontinued        500 mg 250 mL/hr over 60 Minutes Intravenous Every 24 hours 05/17/23 1412 05/18/23 1101   05/18/23 1000  cefTRIAXone (ROCEPHIN) 2 g in sodium chloride 0.9 % 100 mL IVPB  Status:  Discontinued        2 g 200 mL/hr over 30 Minutes Intravenous Every 24 hours 05/18/23 0544 05/20/23 1028   05/17/23 1400  cefTRIAXone (ROCEPHIN) injection 1 g  1 g Intramuscular  Once 05/17/23 1346 05/17/23 1450   05/17/23 1300  cefTRIAXone (ROCEPHIN) 1 g in sodium chloride 0.9 % 100 mL IVPB  Status:  Discontinued        1 g 200 mL/hr over 30 Minutes Intravenous  Once 05/17/23 1257 05/17/23 2011   05/17/23 1300  azithromycin (ZITHROMAX) tablet 500 mg        500 mg Oral  Once 05/17/23 1257 05/17/23 1327      Culture/Microbiology    Component Value Date/Time   SDES  05/17/2023 2049    BLOOD LEFT  ANTECUBITAL Performed at [facility], [address], [place], [place] [address]    SPECREQUEST  05/17/2023 2049    BOTTLES DRAWN AEROBIC AND ANAEROBIC Blood Culture adequate volume Performed at [facility], [address], [place], [place] [address]    CULT  05/17/2023 2049    NO GROWTH 5 DAYS Performed at [facility], [address], [place], [place] [address]    REPTSTATUS 05/22/2023 FINAL 05/17/2023 2049    Other culture-see note  Radiology Studies: No results found.   LOS: 5 days   Lanae Boast, MD [facility]  05/22/2023, 10:44 AM

## 2023-05-22 NOTE — TOC Progression Note (Signed)
Transition of Care [facility]) - Progression Note    Patient Details  Name: Alicia Schwartz MRN: [id number] Date of Birth: [birth year]  Transition of [facility] CM/SW Contact  Yeray Tomas, Olegario Messier, RN Phone Number: 05/22/2023, 4:04 PM  Clinical Narrative: Bed offers left in rm await choice from [name] prior auth.      Expected Discharge Plan: Skilled Nursing Facility Barriers to Discharge: Continued Medical Work up  Expected Discharge Plan and Services   Discharge Planning Services: CM Consult Post Acute Care Choice: Skilled Nursing Facility Living arrangements for the past 2 months: Single Family Home                                       Social Determinants of Health (SDOH) Interventions SDOH Screenings   Food Insecurity: No Food Insecurity (05/20/2023)  Housing: Low Risk  (05/20/2023)  Transportation Needs: Patient Unable To Answer (05/20/2023)  Utilities: Patient Unable To Answer (05/20/2023)  Tobacco Use: Low Risk  (05/17/2023)    Readmission Risk Interventions     No data to display

## 2023-05-22 NOTE — Plan of Care (Signed)
  Problem: Education: Goal: Knowledge of General Education information will improve Description: Including pain rating scale, medication(s)/side effects and non-pharmacologic comfort measures Outcome: Progressing   Problem: Clinical Measurements: Goal: Ability to maintain clinical measurements within normal limits will improve Outcome: Progressing   Problem: Safety: Goal: Ability to remain free from injury will improve Outcome: Progressing   

## 2023-05-22 NOTE — Progress Notes (Signed)
Physical Therapy Treatment Patient Details Name: Krisha Fantauzzi MRN: [id number] DOB: 04-04-1931 Today's Date: 05/22/2023   History of Present Illness Lucilia Veretta Mages is a 89 y.o. female with medical history significant for hypertension, paroxysmal SVT being admitted to the hospital with acute hypoxic respiratory failure and weakness likely due to community-acquired pneumonia    PT Comments   Pt admitted with above diagnosis.  Pt currently with functional limitations due to the deficits listed below (see PT Problem List). Pt in bed when PT arrived. Pt required encouragement to getting out of bed. Nurse tech remained during therapy session able to provide assist and encouragement. Pt required mod A for supine to sit, min A x 2 for sit to stand  from EOB. During the course of transitioning from sit to stand pt voided bowels and continued to have incontinent episodes once standing with bowel and bladder. Pt demonstrated improved standing tolerance, IND and balance with multiple standing bouts once of which over 2 mins. Pt able to  perform SPT bed to recliner with RW and min A x 2 for RW management. Pt left in recliner, all needs in place and nurse and nurse tech present. Pt on 2 L/min supplemental O2 and 93% with productive cough throughout tx session. Pt will benefit from acute skilled PT to increase their independence and safety with mobility to allow discharge.      If plan is discharge home, recommend the following: Help with stairs or ramp for entrance;A little help with bathing/dressing/bathroom;Assist for transportation;Assistance with cooking/housework;Direct supervision/assist for medications management;A lot of help with walking and/or transfers   Can travel by private vehicle     No  Equipment Recommendations  None recommended by PT    Recommendations for Other Services       Precautions / Restrictions Precautions Precautions: Fall Precaution Comments:  O2 Restrictions Weight Bearing Restrictions: No     Mobility  Bed Mobility Overal bed mobility: Needs Assistance Bed Mobility: Supine to Sit     Supine to sit: HOB elevated, Used rails, Mod assist     General bed mobility comments: required increased time and effort for bed mobility, with cues to reach for and pull on bed rail, as well as advance BLE off bed, use of bed pad to complete transition to EOB    Transfers Overall transfer level: Needs assistance Equipment used: Rolling walker (2 wheels) Transfers: Sit to/from Stand, Bed to chair/wheelchair/BSC Sit to Stand: Mod assist, Min assist, Max assist, +2 physical assistance, From elevated surface Stand pivot transfers: Min assist, +2 physical assistance, +2 safety/equipment         General transfer comment: cues for proper UE placement with pull to stand from EOB, cues for encouragement and extension posture, once transitioning into standing noted to have started to void bowels, pt had bowel and bladder incontence epiode in standing, nurse tech and PT assisted pt with ADL, PT focus on standing posture, balance, and duration.    Ambulation/Gait               General Gait Details: NT   Stairs             Wheelchair Mobility     Tilt Bed    Modified Rankin (Stroke Patients Only)       Balance Overall balance assessment: Needs assistance Sitting-balance support: Feet supported Sitting balance-Leahy Scale: Fair     Standing balance support: During functional activity, Single extremity supported, Reliant on assistive device for balance Standing balance-Leahy  Scale: Poor Standing balance comment: pt required CGA for static standing at RW with B UE support and cues for extension posture and encouragement with pt tolerating 3 standing bouts up to 2:30 significant improvement from standing balance and tolerance demonstrated on 10/29.                            Cognition Arousal:  Alert Behavior During Therapy: WFL for tasks assessed/performed Overall Cognitive Status: No family/caregiver present to determine baseline cognitive functioning Area of Impairment: Problem solving, Memory, Awareness                         Safety/Judgement: Decreased awareness of safety, Decreased awareness of deficits   Problem Solving: Difficulty sequencing, Requires verbal cues General Comments: pt very HOH and has B hearing aids        Exercises      General Comments        Pertinent Vitals/Pain Pain Assessment Pain Assessment: Faces Faces Pain Scale: Hurts a little bit Pain Location: side Pain Descriptors / Indicators: Grimacing, Guarding Pain Intervention(s): Monitored during session    Home Living                          Prior Function            PT Goals (current goals can now be found in the care plan section) Acute Rehab PT Goals PT Goal Formulation: With patient Time For Goal Achievement: 06/07/23 Potential to Achieve Goals: Fair Progress towards PT goals: Progressing toward goals    Frequency    Min 1X/week      PT Plan      Co-evaluation              AM-PAC PT "6 Clicks" Mobility   Outcome Measure  Help needed turning from your back to your side while in a flat bed without using bedrails?: A Little Help needed moving from lying on your back to sitting on the side of a flat bed without using bedrails?: A Lot Help needed moving to and from a bed to a chair (including a wheelchair)?: A Lot Help needed standing up from a chair using your arms (e.g., wheelchair or bedside chair)?: A Lot Help needed to walk in hospital room?: Total Help needed climbing 3-5 steps with a railing? : Total 6 Click Score: 11    End of Session Equipment Utilized During Treatment: Gait belt;Oxygen Activity Tolerance: Patient limited by fatigue Patient left: with call bell/phone within reach;with nursing/sitter in room;in chair Nurse  Communication: Mobility status PT Visit Diagnosis: Other abnormalities of gait and mobility (R26.89);Difficulty in walking, not elsewhere classified (R26.2)     Time: 1914-7829 PT Time Calculation (min) (ACUTE ONLY): 31 min  Charges:    $Therapeutic Activity: 23-37 mins PT General Charges $$ ACUTE PT VISIT: 1 Visit                     [name], PT Acute Rehab    Jacqualyn Posey 05/22/2023, 12:11 PM

## 2023-05-22 NOTE — Progress Notes (Addendum)
[facility]  Date of Admission:  05/17/2023      Abx: 10/28-c amox-clav  10/25-28 ceftriaxone 10/25-26 azith   ASSESSMENT: 89 yo female admitted with sepsis and pna 10/25, complicated by right sided pleural effusion and strep pna bacteremia   Patient hard at hearing  Wbc improving Afebrile Stable  hemodynamics Appears deconditioned  10/28 chest ct showed large possibly loculated pleural effusion right side   05/22/23 id assessment Pulm/ccm following and patient's family (son) had consented to thoracentesis -- per pulmonology bedside US pleural effusion appears simple. First attempt thoracentesis unsuccessful as patient not able to be positioned right Moderate leukocytosis not improving Still clinically ill; prognosis guarded  PLAN: Transition iv abx to PO amox-clav Appreciate pulm/ccm management High mortality/morbidity case; prognosis guarded Discussed with primary team     Principal Problem:   Sepsis due to pneumonia (HCC)   No Known Allergies  Scheduled Meds:  amoxicillin-clavulanate  1 tablet Oral BID   Chlorhexidine Gluconate Cloth  6 each Topical Daily   ferrous sulfate  325 mg Oral Q breakfast   heparin injection (subcutaneous)  5,000 Units Subcutaneous Q8H   LORazepam  0.5 mg Oral QHS   metoprolol succinate  25 mg Oral Daily   multivitamin with minerals  1 tablet Oral Daily   sertraline  25 mg Oral Daily   sodium chloride flush  10-40 mL Intracatheter Q12H   Continuous Infusions: PRN Meds:.acetaminophen **OR** acetaminophen, albuterol, guaiFENesin-dextromethorphan, lip balm, senna-docusate, sodium chloride, sodium chloride flush   SUBJECTIVE: Still coughing, and on 1-2 liters o2 Mostly bed bound Feels "a little better" but doesn't know what it is Wbc still 18 Pulm ccm evaluating daily to do thoracentesis    Review of Systems: ROS All other ROS was negative, except mentioned  above     OBJECTIVE: Vitals:   05/21/23 0830 05/21/23 1222 05/21/23 1954 05/22/23 0348  BP: (!) 144/90 127/64 (!) 150/72 (!) 153/85  Pulse: 74 84 90 80  Resp:  20 17 16   Temp: 98.2 F (36.8 C) 97.7 F (36.5 C) 98.3 F (36.8 C) 98.4 F (36.9 C)  TempSrc: Oral Oral Oral Oral  SpO2: 97% 95% 97% 97%  Weight:      Height:       Body mass index is 21.06 kg/m.  Physical Exam  General/constitutional: elderly female in bed, hard at hearing, but verbal, alert, cooperative; noted wet sounding cough at times HEENT: Normocephalic, PER, Conj Clear, EOMI CV: rrr no mrg Lungs: coarse breath sound; normal respiratory effort; on 2 liters oxygen via [place] Abd: Soft, Nontender Ext: no edema Skin: No Rash Neuro: generalized weakness MSK: no peripheral joint swelling/tenderness/warmth     Lab Results Lab Results  Component Value Date   WBC 18.2 (H) 05/22/2023   HGB 9.6 (L) 05/22/2023   HCT 29.4 (L) 05/22/2023   MCV 103.9 (H) 05/22/2023   PLT 383 05/22/2023    Lab Results  Component Value Date   CREATININE 0.93 05/22/2023   BUN 18 05/22/2023   NA 134 (L) 05/22/2023   K 4.2 05/22/2023   CL 105 05/22/2023   CO2 23 05/22/2023    Lab Results  Component Value Date   ALT 16 05/17/2023   AST 19 05/17/2023   ALKPHOS 127 (H) 05/17/2023   BILITOT 1.4 (H) 05/17/2023      Microbiology: Recent Results (from the past 240 hour(s))  Resp panel by RT-PCR (RSV, Flu A&B, Covid) Anterior Nasal  Swab     Status: None   Collection Time: 05/17/23 10:40 AM   Specimen: Anterior Nasal Swab  Result Value Ref Range Status   SARS Coronavirus 2 by RT PCR NEGATIVE NEGATIVE Final    Comment: (NOTE) SARS-CoV-2 target nucleic acids are NOT DETECTED.  The SARS-CoV-2 RNA is generally detectable in upper respiratory specimens during the acute phase of infection. The lowest concentration of SARS-CoV-2 viral copies this assay can detect is 138 copies/mL. A negative result does not preclude  SARS-Cov-2 infection and should not be used as the sole basis for treatment or other patient management decisions. A negative result may occur with  improper specimen collection/handling, submission of specimen other than nasopharyngeal swab, presence of viral mutation(s) within the areas targeted by this assay, and inadequate number of viral copies(<138 copies/mL). A negative result must be combined with clinical observations, patient history, and epidemiological information. The expected result is Negative.  Fact Sheet for Patients:  BloggerCourse.com  Fact Sheet for Healthcare Providers:  SeriousBroker.it  This test is no t yet approved or cleared by the [place] FDA and  has been authorized for detection and/or diagnosis of SARS-CoV-2 by FDA under an Emergency Use Authorization (EUA). This EUA will remain  in effect (meaning this test can be used) for the duration of the COVID-19 declaration under Section 564(b)(1) of the Act, 21 U.S.C.section 360bbb-3(b)(1), unless the authorization is terminated  or revoked sooner.       Influenza A by PCR NEGATIVE NEGATIVE Final   Influenza B by PCR NEGATIVE NEGATIVE Final    Comment: (NOTE) The Xpert Xpress SARS-CoV-2/FLU/RSV plus assay is intended as an aid in the diagnosis of influenza from Nasopharyngeal swab specimens and should not be used as a sole basis for treatment. Nasal washings and aspirates are unacceptable for Xpert Xpress SARS-CoV-2/FLU/RSV testing.  Fact Sheet for Patients: BloggerCourse.com  Fact Sheet for Healthcare Providers: SeriousBroker.it  This test is not yet approved or cleared by the [place] FDA and has been authorized for detection and/or diagnosis of SARS-CoV-2 by FDA under an Emergency Use Authorization (EUA). This EUA will remain in effect (meaning this test can be used) for the duration of  the COVID-19 declaration under Section 564(b)(1) of the Act, 21 U.S.C. section 360bbb-3(b)(1), unless the authorization is terminated or revoked.     Resp Syncytial Virus by PCR NEGATIVE NEGATIVE Final    Comment: (NOTE) Fact Sheet for Patients: BloggerCourse.com  Fact Sheet for Healthcare Providers: SeriousBroker.it  This test is not yet approved or cleared by the [place] FDA and has been authorized for detection and/or diagnosis of SARS-CoV-2 by FDA under an Emergency Use Authorization (EUA). This EUA will remain in effect (meaning this test can be used) for the duration of the COVID-19 declaration under Section 564(b)(1) of the Act, 21 U.S.C. section 360bbb-3(b)(1), unless the authorization is terminated or revoked.  Performed at [facility], [address], [place], [place] [address]   Blood Culture (routine x 2)     Status: Abnormal   Collection Time: 05/17/23 12:57 PM   Specimen: Neck; Blood  Result Value Ref Range Status   Specimen Description   Final    NECK LEFT Performed at [facility], [address], [place], [place] [address]    Special Requests   Final    BOTTLES DRAWN AEROBIC AND ANAEROBIC Blood Culture adequate volume Performed at [facility], [address] [place], [place] [address]  Culture  Setup Time   Final    GRAM POSITIVE COCCI IN BOTH AEROBIC AND ANAEROBIC BOTTLES CRITICAL RESULT CALLED TO, READ BACK BY AND VERIFIED WITH: L POINDEXTER,PHARMD@0520  05/18/23 MK Performed at [facility], [address], [place], [place] [address]    Culture STREPTOCOCCUS PNEUMONIAE (A)  Final   Report Status 05/20/2023 FINAL  Final   Organism ID, Bacteria STREPTOCOCCUS PNEUMONIAE  Final      Susceptibility   Streptococcus pneumoniae - MIC*    ERYTHROMYCIN >=8 RESISTANT Resistant     LEVOFLOXACIN 0.5 SENSITIVE Sensitive     VANCOMYCIN 0.5  SENSITIVE Sensitive     PENICILLIN (meningitis) 0.5 RESISTANT Resistant     PENO - penicillin 0.5      PENICILLIN (non-meningitis) 0.5 SENSITIVE Sensitive     PENICILLIN (oral) 0.5 INTERMEDIATE Intermediate     CEFTRIAXONE (non-meningitis) 1 SENSITIVE Sensitive     CEFTRIAXONE (meningitis) 1 INTERMEDIATE Intermediate     * STREPTOCOCCUS PNEUMONIAE  Blood Culture ID Panel (Reflexed)     Status: Abnormal   Collection Time: 05/17/23 12:57 PM  Result Value Ref Range Status   Enterococcus faecalis NOT DETECTED NOT DETECTED Final   Enterococcus Faecium NOT DETECTED NOT DETECTED Final   Listeria monocytogenes NOT DETECTED NOT DETECTED Final   Staphylococcus species NOT DETECTED NOT DETECTED Final   Staphylococcus aureus (BCID) NOT DETECTED NOT DETECTED Final   Staphylococcus epidermidis NOT DETECTED NOT DETECTED Final   Staphylococcus lugdunensis NOT DETECTED NOT DETECTED Final   Streptococcus species DETECTED (A) NOT DETECTED Final    Comment: CRITICAL RESULT CALLED TO, READ BACK BY AND VERIFIED WITH: LPOINDEXTER,PHARMD@0520  05/18/23 MK    Streptococcus agalactiae NOT DETECTED NOT DETECTED Final   Streptococcus pneumoniae DETECTED (A) NOT DETECTED Final    Comment: CRITICAL RESULT CALLED TO, READ BACK BY AND VERIFIED WITH: L POINDEXTER,PHARMD@0520  05/18/23 MK    Streptococcus pyogenes NOT DETECTED NOT DETECTED Final   A.calcoaceticus-baumannii NOT DETECTED NOT DETECTED Final   Bacteroides fragilis NOT DETECTED NOT DETECTED Final   Enterobacterales NOT DETECTED NOT DETECTED Final   Enterobacter cloacae complex NOT DETECTED NOT DETECTED Final   Escherichia coli NOT DETECTED NOT DETECTED Final   Klebsiella aerogenes NOT DETECTED NOT DETECTED Final   Klebsiella oxytoca NOT DETECTED NOT DETECTED Final   Klebsiella pneumoniae NOT DETECTED NOT DETECTED Final   Proteus species NOT DETECTED NOT DETECTED Final   Salmonella species NOT DETECTED NOT DETECTED Final   Serratia marcescens NOT  DETECTED NOT DETECTED Final   Haemophilus influenzae NOT DETECTED NOT DETECTED Final   Neisseria meningitidis NOT DETECTED NOT DETECTED Final   Pseudomonas aeruginosa NOT DETECTED NOT DETECTED Final   Stenotrophomonas maltophilia NOT DETECTED NOT DETECTED Final   Candida albicans NOT DETECTED NOT DETECTED Final   Candida auris NOT DETECTED NOT DETECTED Final   Candida glabrata NOT DETECTED NOT DETECTED Final   Candida krusei NOT DETECTED NOT DETECTED Final   Candida parapsilosis NOT DETECTED NOT DETECTED Final   Candida tropicalis NOT DETECTED NOT DETECTED Final   Cryptococcus neoformans/gattii NOT DETECTED NOT DETECTED Final    Comment: Performed at [facility], [address], [place], [place] [address]  Blood Culture (routine x 2)     Status: None   Collection Time: 05/17/23  8:49 PM   Specimen: BLOOD  Result Value Ref Range Status   Specimen Description   Final    BLOOD LEFT ANTECUBITAL Performed at [facility], [address], [place],  [place] [address]    Special Requests   Final    BOTTLES DRAWN AEROBIC AND ANAEROBIC Blood Culture adequate volume Performed at [facility], [address], [place], [place] [address]    Culture   Final    NO GROWTH 5 DAYS Performed at [facility], [address], [place], [place] [address]    Report Status 05/22/2023 FINAL  Final     Serology:   Imaging: If present, new imagings (plain films, ct scans, and mri) have been personally visualized and interpreted; radiology reports have been reviewed. Decision making incorporated into the Impression / Recommendations.  10/28 chest ct 1. Moderate right and small left pleural effusions, including a loculated right-sided fissural component. 2. Complete collapse of the right lower lobe and partial collapse of the right middle lobe and left lower lobe secondary to pleural effusions. 3. Age-indeterminate severe compression deformity of T9. Correlate for  point tenderness. 4. Severe aortic Atherosclerosis (ICD10-I70.0) with area of tortuosity and focal narrowing involving the descending thoracic aorta, likely unchanged when compared with calcification pattern on prior thoracic spine radiograph dated Dec 14, 2020, although lack of IV contrast limits evaluation.   10/25 cxr Enlarged heart with dilated calcified aorta.  Vascular congestion.   Right-sided pleural effusion. Separate rounded opacity in the right lung base could be fluid tracking along fissure but underlying lesion is not excluded. Recommend short follow-up or CT   Raymondo Band, MD [facility] [phone] pager    05/22/2023, 10:29 AM

## 2023-05-23 ENCOUNTER — Inpatient Hospital Stay (HOSPITAL_COMMUNITY): Payer: Medicare HMO

## 2023-05-23 DIAGNOSIS — J189 Pneumonia, unspecified organism: Secondary | ICD-10-CM | POA: Diagnosis not present

## 2023-05-23 DIAGNOSIS — A419 Sepsis, unspecified organism: Secondary | ICD-10-CM | POA: Diagnosis not present

## 2023-05-23 DIAGNOSIS — Z515 Encounter for palliative care: Secondary | ICD-10-CM

## 2023-05-23 DIAGNOSIS — J90 Pleural effusion, not elsewhere classified: Secondary | ICD-10-CM | POA: Diagnosis not present

## 2023-05-23 LAB — BASIC METABOLIC PANEL
Anion gap: 7 (ref 5–15)
BUN: 18 mg/dL (ref 8–23)
CO2: 22 mmol/L (ref 22–32)
Calcium: 7.9 mg/dL — ABNORMAL LOW (ref 8.9–10.3)
Chloride: 105 mmol/L (ref 98–111)
Creatinine, Ser: 0.76 mg/dL (ref 0.44–1.00)
GFR, Estimated: >60 mL/min (ref >60)
Glucose, Bld: 96 mg/dL (ref 70–99)
Potassium: 4 mmol/L (ref 3.5–5.1)
Sodium: 134 mmol/L — ABNORMAL LOW (ref 135–145)

## 2023-05-23 LAB — CBC
HCT: 27.1 % — ABNORMAL LOW (ref 36.0–46.0)
Hemoglobin: 9 g/dL — ABNORMAL LOW (ref 12.0–15.0)
MCH: 34.2 pg — ABNORMAL HIGH (ref 26.0–34.0)
MCHC: 33.2 g/dL (ref 30.0–36.0)
MCV: 103 fL — ABNORMAL HIGH (ref 80.0–100.0)
Platelets: 368 10*3/uL (ref 150–400)
RBC: 2.63 MIL/uL — ABNORMAL LOW (ref 3.87–5.11)
RDW: 13.6 % (ref 11.5–15.5)
WBC: 18.3 10*3/uL — ABNORMAL HIGH (ref 4.0–10.5)
nRBC: 0 % (ref 0.0–0.2)

## 2023-05-23 LAB — LACTATE DEHYDROGENASE, PLEURAL OR PERITONEAL FLUID: LD, Fluid: 455 U/L — ABNORMAL HIGH (ref 3–23)

## 2023-05-23 LAB — BODY FLUID CELL COUNT WITH DIFFERENTIAL
Lymphs, Fluid: 6 %
Monocyte-Macrophage-Serous Fluid: 8 % — ABNORMAL LOW (ref 50–90)
Neutrophil Count, Fluid: 86 % — ABNORMAL HIGH (ref 0–25)
Total Nucleated Cell Count, Fluid: 194 uL (ref 0–1000)

## 2023-05-23 LAB — PROTEIN, PLEURAL OR PERITONEAL FLUID: Total protein, fluid: 3.1 g/dL

## 2023-05-23 LAB — LACTATE DEHYDROGENASE: LDH: 131 U/L (ref 98–192)

## 2023-05-23 LAB — PROTEIN, TOTAL: Total Protein: 5.9 g/dL — ABNORMAL LOW (ref 6.5–8.1)

## 2023-05-23 NOTE — Consult Note (Addendum)
Consultation Note Date: 05/23/2023   Patient Name: Alicia Schwartz  DOB: 01/28/1931  MRN: [id number]  Age / Sex: 89 y.o., female  PCP: Linus Galas, NP Referring Physician: Lanae Boast, MD  Reason for Consultation:  goals of care  HPI/Patient Profile: 89 y.o. female  with past medical history of HTN, paroxysmal SVT, HTN, very HOH, chronic back pain, admitted on 05/17/2023 with sepsis due to pneumonia. CT reveals L and R pleural effusions with loculated R side component, complete collapse of RLL and partial collapse of RML and LLL due to pleural effusion. Thoracentesis was attempted yesterday, but unable to be completed due to position. Chest tube being considered. Monitoring with daily ultrasound for now. Palliative medicine consulted for GOC.   Primary Decision Maker NEXT OF KIN - pt son- Alicia Schwartz  Discussion: I have reviewed medical records including Care Everywhere, progress notes from this and prior admissions, labs and imaging, discussed with RN.  On evaluation patient is awake and alert, in no distress. She is very HOH and cannot participate in GOC discussion.   I called and spoke with her son, Alicia Schwartz. She is followed outpatient by [facility]  As far as functional and nutritional status - prior to admission she lived at home with Alicia Schwartz. She is able to ambulate around the house using a walker, but was falling a lot, and was very fearful of falling. She was able to dress herself and toilet herself but required assistance with bathing and meal preparation.    We discussed patient's current illness and what it means in the larger context of patient's on-going co-morbidities.  Natural disease trajectory and expectations at EOL were discussed. We discussed the seriousness of her illness and the high risk that she could worsen.   Advanced Care Planning: discussed with permission of  patient's son  I attempted to elicit values and goals of care important to the patient.    The difference between aggressive medical intervention and comfort care was considered in light of the patient's goals of care.   Advance directives, concepts specific to code status, artificial feeding and hydration, and rehospitalization were considered and discussed.  She has  MOST form complete at home, but Alicia Schwartz is uncertain what is on it- he plans to bring a copy for now.  Discussed with patient/family the importance of continued conversation with family and the medical providers regarding overall plan of care and treatment options, ensuring decisions are within the context of the patient's values and GOCs.    Alicia Schwartz expressed GOC for now and to continue full scope care in hopes that patient will d/c to SNF. We discussed that we may need to have further discussions if patient's status worsens.    Questions and concerns were addressed. The family was encouraged to call with questions or concerns.   SUMMARY OF RECOMMENDATIONS: -son to bring MOST form to hospital tomorrow for review -PMT will continue to follow and support medical decision making -continue full scope care for now    Code Status/Advance  Care Planning: DNR   Prognosis:   Unable to determine  Discharge Planning: Skilled Nursing Facility for rehab with Palliative care service follow-up  Primary Diagnoses: Present on Admission:  Sepsis due to pneumonia [facility])   Review of Systems  Unable to perform ROS   Physical Exam Vitals and nursing note reviewed.  Pulmonary:     Effort: Pulmonary effort is normal.  Neurological:     Mental Status: She is alert.     Comments: Dense loss of hearing     Vital Signs: BP 138/77 (BP Location: Left Arm)   Pulse 96   Temp 98.1 F (36.7 C) (Oral)   Resp (!) 24   Ht 4\' 11"  (1.499 m)   Wt 47.3 kg   SpO2 98%   BMI 21.06 kg/m  Pain Scale: PAINAD POSS *See Group Information*:  1-Acceptable,Awake and alert Pain Score: 0-No pain   SpO2: SpO2: 98 % O2 Device:SpO2: 98 % O2 Flow Rate: .O2 Flow Rate (L/min): 2 L/min  IO: Intake/output summary:  Intake/Output Summary (Last 24 hours) at 05/23/2023 1313 Last data filed at 05/23/2023 0500 Gross per 24 hour  Intake 360 ml  Output 200 ml  Net 160 ml    LBM: Last BM Date : 05/19/23 Baseline Weight: Weight: 46.9 kg Most recent weight: Weight: 47.3 kg       Thank you for this consult. Palliative medicine will continue to follow and assist as needed.  Time: 80 minutes ACP time: 30 minutes Signed by: Ocie Bob, AGNP-C Palliative Medicine  Time includes:   Preparing to see the patient (e.g., review of tests) Obtaining and/or reviewing separately obtained history Performing a medically necessary appropriate examination and/or evaluation Counseling and educating the patient/family/caregiver Ordering medications, tests, or procedures Referring and communicating with other health care professionals (when not reported separately) Documenting clinical information in the electronic or other health record Independently interpreting results (not reported separately) and communicating results to the patient/family/caregiver Care coordination (not reported separately) Clinical documentation   Please contact Palliative Medicine Team phone at [phone] for questions and concerns.  For individual provider: See Loretha Stapler

## 2023-05-23 NOTE — Progress Notes (Signed)
NAME:  Alicia Schwartz, MRN:  [id number], DOB:  1930/12/18, LOS: 6 ADMISSION DATE:  05/17/2023, CONSULTATION DATE:  05/21/23 REFERRING MD:  Rutha Bouchard, MD CHIEF COMPLAINT:  Pleural Effusion   History of Present Illness:  Alicia Schwartz is a 89 year old woman with hypertension, SVT and hearing loss who is admitted with sepsis due to streptococcus pneumoniae bacteremia and pneumonia with right pleural.   PCCM consulted for evaluation of pleural effusion. CT Chest 10/28 showed moderate right effusion including a loculated fissural component. Complete collapse of RLL.   Patient very hard of hearing, difficult to obtain history. Spoke with son, [name] via phone.   Pertinent  Medical History   Past Medical History:  Diagnosis Date   Fracture, subtrochanteric, right femur, closed (HCC) 02/11/2015   Hypertension    Paroxysmal supraventricular tachycardia (HCC) 02/12/2015   Significant Hospital Events: Including procedures, antibiotic start and stop dates in addition to other pertinent events   10/25 admitted 10/29 PCCM consult for right pleural effusion, unable to perform thora at bedside due to positioning and unsafe windo, effusion appeared simple  Interim History / Subjective:   No acute events overnight WBC count is slightly increased over past couple of days   Objective   Blood pressure (!) 149/72, pulse 96, temperature (!) 97.5 F (36.4 C), temperature source Oral, resp. rate 20, height 4\' 11"  (1.499 m), weight 47.3 kg, SpO2 97%.        Intake/Output Summary (Last 24 hours) at 05/23/2023 0753 Last data filed at 05/23/2023 0500 Gross per 24 hour  Intake 360 ml  Output 200 ml  Net 160 ml   Filed Weights   05/17/23 1004 05/17/23 2003  Weight: 46.9 kg 47.3 kg    Examination: General: elderly woman, hard of hearing, no distress HENT: Britt/AT, moist mucous membranes Lungs: diminished right base, no wheezing Cardiovascular: rrr, no murmurs Abdomen: soft, non-tender,  non-distended Extremities: warm, no edema Neuro: alert, moving all extremities GU: n/a  Resolved Hospital Problem list     Assessment & Plan:  Sepsis due to bacteremia and pneumonia, Strep pneumoniae Acute hypoxemic respiratory failure Right Pleural Effusion  Plan: - discussed placing chest tube vs thoracentesis with patient's sony, he agreed to a thoracentesis but unable to do procedure due to positioning on 10/29 - Thora today with patient in seated upright position yielded 50cc of fluid - follow up pleural fluid cultures - May require chest tube - continue antibiotics per ID  Best Practice (right click and "Reselect all SmartList Selections" daily)   Per primary team  Labs   CBC: Recent Labs  Lab 05/17/23 1303 05/18/23 0158 05/19/23 0343 05/20/23 0322 05/21/23 0327 05/22/23 0323 05/23/23 0221  WBC 27.1*   < > 18.2* 18.6* 18.0* 18.2* 18.3*  NEUTROABS 24.5*  --  15.4*  --   --   --   --   HGB 10.8*   < > 9.8* 9.2* 9.0* 9.6* 9.0*  HCT 30.1*   < > 28.5* 27.5* 27.0* 29.4* 27.1*  MCV 98.0   < > 100.4* 103.4* 103.1* 103.9* 103.0*  PLT 279   < > 290 331 333 383 368   < > = values in this interval not displayed.    Basic Metabolic Panel: Recent Labs  Lab 05/17/23 1303 05/18/23 0158 05/19/23 0343 05/20/23 0322 05/21/23 0327 05/22/23 0323 05/23/23 0221  NA 133*   < > 135 136 135 134* 134*  K 3.0*   < > 4.4 4.3 4.4 4.2 4.0  CL 96*   < > 104 107 106 105 105  CO2 25   < > 21* 24 24 23 22   GLUCOSE 125*   < > 135* 96 98 94 96  BUN 37*   < > 31* 25* 21 18 18   CREATININE 1.17*   < > 1.12* 0.99 0.96 0.93 0.76  CALCIUM 8.9   < > 8.4* 8.1* 8.0* 8.0* 7.9*  MG 2.1  --  2.1 2.2  --   --   --   PHOS  --   --  1.8* 4.1  --   --   --    < > = values in this interval not displayed.   GFR: Estimated Creatinine Clearance: 31.9 mL/min (by C-G formula based on SCr of 0.76 mg/dL). Recent Labs  Lab 05/17/23 1304 05/18/23 0158 05/20/23 0322 05/21/23 0327 05/22/23 0323  05/23/23 0221  WBC  --    < > 18.6* 18.0* 18.2* 18.3*  LATICACIDVEN 0.9  --   --   --   --   --    < > = values in this interval not displayed.    Liver Function Tests: Recent Labs  Lab 05/17/23 1303 05/19/23 0343 05/20/23 0322  AST 19  --   --   ALT 16  --   --   ALKPHOS 127*  --   --   BILITOT 1.4*  --   --   PROT 6.5  --   --   ALBUMIN 2.8* 2.4* 2.2*   No results for input(s): "LIPASE", "AMYLASE" in the last 168 hours. No results for input(s): "AMMONIA" in the last 168 hours.  ABG No results found for: "PHART", "PCO2ART", "PO2ART", "HCO3", "TCO2", "ACIDBASEDEF", "O2SAT"   Coagulation Profile: No results for input(s): "INR", "PROTIME" in the last 168 hours.  Cardiac Enzymes: No results for input(s): "CKTOTAL", "CKMB", "CKMBINDEX", "TROPONINI" in the last 168 hours.  HbA1C: No results found for: "HGBA1C"  CBG: Recent Labs  Lab 05/19/23 1129 05/21/23 1055  GLUCAP 150* 97    Critical care time: n/a    Melody Comas, MD [facility] Office: [phone]   See Amion for personal pager PCCM on call pager [phone] until 7pm. Please call Elink 7p-7a. [phone]

## 2023-05-23 NOTE — Procedures (Signed)
Thoracentesis  Procedure Note  Alicia Schwartz  [id number]  1931-01-02  Date:05/23/23  Time:3:07 PM   Provider Performing:Muzamil Harker B Nyhla Mountjoy   Procedure: Thoracentesis with imaging guidance (40981)  Indication(s) Pleural Effusion  Consent Risks of the procedure as well as the alternatives and risks of each were explained to the patient and/or caregiver.  Consent for the procedure was obtained and is signed in the bedside chart  Anesthesia Topical only with 1% lidocaine    Time Out Verified patient identification, verified procedure, site/side was marked, verified correct patient position, special equipment/implants available, medications/allergies/relevant history reviewed, required imaging and test results available.   Sterile Technique Maximal sterile technique including full sterile barrier drape, hand hygiene, sterile gown, sterile gloves, mask, hair covering, sterile ultrasound probe cover (if used).  Procedure Description Ultrasound was used to identify appropriate pleural anatomy for placement and overlying skin marked.  Area of drainage cleaned and draped in sterile fashion. Lidocaine was used to anesthetize the skin and subcutaneous tissue.  50 cc's of straw colored appearing fluid was drained from the right pleural space. Catheter then removed and bandaid applied to site.   Complications/Tolerance None; patient tolerated the procedure well. Chest X-ray is ordered to confirm no post-procedural complication.   EBL Minimal   Specimen(s) Pleural fluid

## 2023-05-23 NOTE — Plan of Care (Signed)
  Problem: Education: Goal: Knowledge of General Education information will improve Description: Including pain rating scale, medication(s)/side effects and non-pharmacologic comfort measures Outcome: Progressing   Problem: Clinical Measurements: Goal: Respiratory complications will improve Outcome: Progressing   Problem: Respiratory: Goal: Ability to maintain adequate ventilation will improve Outcome: Progressing

## 2023-05-23 NOTE — Progress Notes (Addendum)
Speech Language Pathology Treatment: (P) Dysphagia  Patient Details Name: Alicia Schwartz MRN: [id number] DOB: 02/20/1931 Today's Date: 05/23/2023 Time: (P) 1210-(P) 1236 SLP Time Calculation (min) (ACUTE ONLY): (P) 26 min  Assessment / Plan / Recommendation Clinical Impression  Pt is making good progress in terms of feeding herself and her clinical tolerance of po intake.  Today she was self fed roast, potatoes, carrots, tea and water.  Mildly prolonged mastication with meat but adequate oral clearance and no s/s of aspiration during intake.  SLP can not rule out aspiration clinically however she appears to be tolerating po she is accepting.  SLP inquired if pt was concerned for potentially having food "come back up" to which she said "I hope not" and denies this occurring recently.  Note palliative referral pending.  Advised pt she could use applesauce to aid oral transiting of particulate foods if liquids cause her to "cough" while clearing food and added this to her swallow precaution sign.      Given pt's blood is revealing Streptococcus pneumoniae-source  - cannot rule out oropharyngeal nor esophageal aspiration as contributing risk factor.  If desire to fully rule out OVERT aspiration - MBS would provide such information but this may not change her care plan and her current intake is poor.  MBS would not rule out occult silent aspiration and given pt report of h/o things "coming up" - suspect esoph deficits/reflux may also contribute?   Defer to primary team and palliative for GOC. In this SLP's opinion, po with generalized aspirations appears appropriate.       HPI HPI: (P) 89 yo female adm to [facility] with weakness/respiratory failure.  Pt PMH CAP, back pain, femur fx s/p 2016, chronic back pain, bed ridden, T12 FX h/o SVT.  She resides with her son.  Concerns for CXR showing right lower lobe consolidation or mass, and small right pleural effusion.  Urinalysis negative.  Patient has  previously been advised to go to SNF but declined due to concern for money.  Swallow eval ordered      SLP Plan  (P) Continue with current plan of care      Recommendations for follow up therapy are one component of a multi-disciplinary discharge planning process, led by the attending physician.  Recommendations may be updated based on patient status, additional functional criteria and insurance authorization.    Recommendations  Diet recommendations: (P) Regular;Thin liquid (try to order soft foods please) Liquids provided via: (P) Straw;Cup Medication Administration: (P) Whole meds with puree Supervision: (P) Patient able to self feed;Intermittent supervision to cue for compensatory strategies Compensations: (P) Slow rate;Small sips/bites Postural Changes and/or Swallow Maneuvers: (P) Seated upright 90 degrees;Upright 30-60 min after meal                              (P) Continue with current plan of care    Rolena Infante, MS [facility] SLP Acute Rehab Services Office [phone]  Chales Abrahams  05/23/2023, 12:46 PM

## 2023-05-23 NOTE — TOC Progression Note (Signed)
Transition of Care [facility]) - Progression Note    Patient Details  Name: Alicia Schwartz MRN: [id number] Date of Birth: 07/15/1931  Transition [facility]) CM/SW Contact  Mayia Megill, Olegario Messier, RN Phone Number: 05/23/2023, 2:29 PM  Clinical Narrative: Spoke to [name] about bed offers-chose [address] rep [name] aware-TOC will start auth.      Expected Discharge Plan: Skilled Nursing Facility Barriers to Discharge: Continued Medical Work up  Expected Discharge Plan and Services   Discharge Planning Services: CM Consult Post Acute Care Choice: Skilled Nursing Facility Living arrangements for the past 2 months: Single Family Home                                       Social Determinants of Health (SDOH) Interventions SDOH Screenings   Food Insecurity: No Food Insecurity (05/20/2023)  Housing: Low Risk  (05/20/2023)  Transportation Needs: Patient Unable To Answer (05/20/2023)  Utilities: Patient Unable To Answer (05/20/2023)  Tobacco Use: Low Risk  (05/17/2023)    Readmission Risk Interventions     No data to display

## 2023-05-23 NOTE — Progress Notes (Signed)
During a Bedside Procedure Thoracentesis Patient MEWS turned RED. Rapid Response and MD in the room

## 2023-05-23 NOTE — TOC Progression Note (Signed)
Transition of Care [facility]) - Progression Note    Patient Details  Name: Alicia Schwartz MRN: [id number] Date of Birth: 14-Jan-1931  Transition of Care [facility]) CM/SW Contact  Diem Dicocco, Olegario Messier, RN Phone Number: 05/23/2023, 3:40 PM  Clinical Narrative:[name] chose [facility] rep Raoul Pitch aware-initiated Berkley Harvey awaiting Berkley Harvey pending Berkley Harvey [id number].       Expected Discharge Plan: Skilled Nursing Facility Barriers to Discharge: Insurance Authorization  Expected Discharge Plan and Services   Discharge Planning Services: CM Consult Post Acute Care Choice: Skilled Nursing Facility Living arrangements for the past 2 months: Single Family Home                                       Social Determinants of Health (SDOH) Interventions SDOH Screenings   Food Insecurity: No Food Insecurity (05/20/2023)  Housing: Low Risk  (05/20/2023)  Transportation Needs: Patient Unable To Answer (05/20/2023)  Utilities: Patient Unable To Answer (05/20/2023)  Tobacco Use: Low Risk  (05/17/2023)    Readmission Risk Interventions     No data to display

## 2023-05-23 NOTE — Progress Notes (Signed)
SLP Cancellation Note  Patient Details Name: Bianney Mccombs MRN: [id number] DOB: Feb 10, 1931   Cancelled treatment:       Reason Eval/Treat Not Completed: Other (comment) (SLP arrived to see pt, helped NT slide pt up in bed - found pt had a bowel movement and needed cleaned; will continue efforts)  Rolena Infante, MS [facility] SLP Acute Rehab Services Office [phone]  Chales Abrahams 05/23/2023, 11:55 AM

## 2023-05-23 NOTE — Progress Notes (Signed)
PROGRESS NOTE Alicia Schwartz  QIH:[id number] DOB: [birth year] DOA: 05/17/2023 PCP: Linus Galas, NP  Brief Narrative/Hospital Course: 89 year old F with PMH of paroxysmal SVT, HTN and diminished hearing brought to ED after a social worker called EMS due to generalized weakness, acute on chronic low back pain and ambulatory dysfunction.  Reportedly saturating at 88% when EMS arrived.  Other vitals were okay.  She has leukocytosis to 27 with left shift.  CXR with small right-sided pleural effusion and possible consolidation versus mass.  Blood cultures obtained.  Patient was started on IV ceftriaxone and azithromycin, and admitted for severe sepsis due to community-acquired pneumonia.   The next day, blood culture with Streptococcus pneumonia in 1 out of 2 bottles.  Azithromycin discontinued.  ID consulted, advised CT to evaluate pleural effusion and continue ceftriaxone ID following closely, CT chest 10/28 showed: Moderate right and small left pleural effusion including loculated right-sided fissural component complete collapse of the right lower lobe and partial collapse RML and left lower lobe due to pleural effusion-PCCM consulted: Recommended chest tube placement given bacteremia for clearance of infusing and reduce the risk of trapped lung.  Subjective: Patient seen and examined this morning Afebrile vital stable, labs shows persistent leukocytosis  She is resting comfortably Hard of hearing pleasantly confused  Assessment and Plan: Principal Problem:   Sepsis due to pneumonia [facility]) Active Problems:   Pleural effusion   Bacteremia   Severe sepsis due to pneumonia and Streptococcus pneumoniae bacteremia CAP Loculated right-sided/Moderate right and small left pleural effusion: Blood culture with Streptococcus pneumoniae-source felt to be CAP, CT scan shows component of loculation, PCCM consulted for chest tube consideration, thoracentesis attempted but no safe window due to repositioning.   At this time monitoring regularly to see if able to do thoracentesis.  ID is following closely.  Echo without gross abnormalities.  Antibiotic now changed to Augmentin, continue supplemental oxygen.  She is at risk of decompensation further mortality and morbidity after discussion with pulmonary and ID. Recent Labs  Lab 05/17/23 1304 05/18/23 0158 05/19/23 0343 05/20/23 0322 05/21/23 0327 05/22/23 0323 05/23/23 0221  WBC  --    < > 18.2* 18.6* 18.0* 18.2* 18.3*  LATICACIDVEN 0.9  --   --   --   --   --   --    < > = values in this interval not displayed.    Acute hypoxic respiratory failure due to pneumonia: Due to CAP, and pleural effusion as above.  Wean oxygen as tolerated to room air.    AKI on CKD 3B: Baseline creatinine 1.2-1.3, on losartan, AKI resolved.  Off IV fluids. monitor renal function Recent Labs    02/07/23 1541 05/17/23 1303 05/18/23 0158 05/19/23 0343 05/20/23 0322 05/21/23 0327 05/22/23 0323 05/23/23 0221  BUN 25* 37* 40* 31* 25* 21 18 18   CREATININE 1.31* 1.17* 1.50* 1.12* 0.99 0.96 0.93 0.76  CO2 26 25 24  21* 24 24 23 22   K 3.9 3.0* 3.3* 4.4 4.3 4.4 4.2 4.0    New onset A-fib: Likely from sepsis, TSH normal, now started on Toprol not on anticoagulation due to fall risk after discussing risk benefits with patient's sons by previous provider, follow-up echocardiogram is stable.  Hypokalemia: Resolved  Anemia of renal disease: Hemoglobin being monitored transfuse for less than 7 g continue iron supplement.  Ambulatory dysfunction and generalized weakness deconditioning: PT OT evaluation TOC consult, social worker concerned about patient being home alone  Goals of care: DNR, severely diminished hearing.  Palliative  care has also been consulted.  DVT prophylaxis: heparin injection 5,000 Units Start: 05/18/23 2200 SCDs Start: 05/17/23 1411 Code Status:   Code Status: Do not attempt resuscitation (DNR) PRE-ARREST INTERVENTIONS DESIRED Family  Communication: plan of care discussed with patient at bedside. Patient status is: Inpatient because of pneumonia Level of care: Telemetry   Dispo: The patient is from: home            Anticipated disposition: TBD. PTOT recommending skilled nursing facility. Objective: Vitals last 24 hrs: Vitals:   05/22/23 1407 05/22/23 2205 05/23/23 0522 05/23/23 0953  BP: 130/70 (!) 147/78 (!) 149/72 (!) 145/74  Pulse: (!) 104 89 96 90  Resp: 16 19 20    Temp: 97.7 F (36.5 C) 98.9 F (37.2 C) (!) 97.5 F (36.4 C)   TempSrc: Oral Oral Oral   SpO2: 96% 97% 97%   Weight:      Height:       Weight change:   Physical Examination: General exam: alert awake, oriented  to self.hard of hearing  HEENT:Oral mucosa moist, Ear/Nose WNL grossly Respiratory system: Bilaterally clear BS,no use of accessory muscle Cardiovascular system: S1 & S2 +, No JVD. Gastrointestinal system: Abdomen soft,NT,ND, BS+ Nervous System: Alert, awake, moving all extremities,and following commands. Extremities: LE edema neg,distal peripheral pulses palpable and warm.  Skin: No rashes,no icterus. MSK: Normal muscle bulk,tone, power   Medications reviewed:  Scheduled Meds:  alteplase  2 mg Intracatheter Once   amoxicillin-clavulanate  1 tablet Oral BID   Chlorhexidine Gluconate Cloth  6 each Topical Daily   ferrous sulfate  325 mg Oral Q breakfast   heparin injection (subcutaneous)  5,000 Units Subcutaneous Q8H   LORazepam  0.5 mg Oral QHS   metoprolol succinate  25 mg Oral Daily   multivitamin with minerals  1 tablet Oral Daily   sertraline  25 mg Oral Daily   sodium chloride flush  10-40 mL Intracatheter Q12H   Continuous Infusions:  Diet Order             Diet regular Room service appropriate? Yes; Fluid consistency: Thin  Diet effective now                  Intake/Output Summary (Last 24 hours) at 05/23/2023 1155 Last data filed at 05/23/2023 0500 Gross per 24 hour  Intake 360 ml  Output 200 ml  Net  160 ml   Net IO Since Admission: 1,066.15 mL [05/23/23 1155]  Wt Readings from Last 3 Encounters:  05/17/23 47.3 kg  02/24/22 46.9 kg  01/26/22 47.6 kg     Unresulted Labs (From admission, onward)     Start     Ordered   05/21/23 0500  CBC  Daily,   R     Question:  Specimen collection method  Answer:  IV Team=IV Team collect   05/20/23 1256   05/21/23 0500  Basic metabolic panel  Daily,   R     Question:  Specimen collection method  Answer:  IV Team=IV Team collect   05/20/23 1256          Data Reviewed: I have personally reviewed following labs and imaging studies CBC: Recent Labs  Lab 05/17/23 1303 05/18/23 0158 05/19/23 0343 05/20/23 0322 05/21/23 0327 05/22/23 0323 05/23/23 0221  WBC 27.1*   < > 18.2* 18.6* 18.0* 18.2* 18.3*  NEUTROABS 24.5*  --  15.4*  --   --   --   --   HGB 10.8*   < >  9.8* 9.2* 9.0* 9.6* 9.0*  HCT 30.1*   < > 28.5* 27.5* 27.0* 29.4* 27.1*  MCV 98.0   < > 100.4* 103.4* 103.1* 103.9* 103.0*  PLT 279   < > 290 331 333 383 368   < > = values in this interval not displayed.   Basic Metabolic Panel: Recent Labs  Lab 05/17/23 1303 05/18/23 0158 05/19/23 0343 05/20/23 0322 05/21/23 0327 05/22/23 0323 05/23/23 0221  NA 133*   < > 135 136 135 134* 134*  K 3.0*   < > 4.4 4.3 4.4 4.2 4.0  CL 96*   < > 104 107 106 105 105  CO2 25   < > 21* 24 24 23 22   GLUCOSE 125*   < > 135* 96 98 94 96  BUN 37*   < > 31* 25* 21 18 18   CREATININE 1.17*   < > 1.12* 0.99 0.96 0.93 0.76  CALCIUM 8.9   < > 8.4* 8.1* 8.0* 8.0* 7.9*  MG 2.1  --  2.1 2.2  --   --   --   PHOS  --   --  1.8* 4.1  --   --   --    < > = values in this interval not displayed.   GFR: Estimated Creatinine Clearance: 31.9 mL/min (by C-G formula based on SCr of 0.76 mg/dL). Liver Function Tests: Recent Labs  Lab 05/17/23 1303 05/19/23 0343 05/20/23 0322  AST 19  --   --   ALT 16  --   --   ALKPHOS 127*  --   --   BILITOT 1.4*  --   --   PROT 6.5  --   --   ALBUMIN 2.8* 2.4*  2.2*  CBG: Recent Labs  Lab 05/19/23 1129 05/21/23 1055  GLUCAP 150* 97   Lipid Profile: No results for input(s): "CHOL", "HDL", "LDLCALC", "TRIG", "CHOLHDL", "LDLDIRECT" in the last 72 hours. Thyroid Function Tests: No results for input(s): "TSH", "T4TOTAL", "FREET4", "T3FREE", "THYROIDAB" in the last 72 hours.  Sepsis Labs: Recent Labs  Lab 05/17/23 1304  LATICACIDVEN 0.9    Recent Results (from the past 240 hour(s))  Resp panel by RT-PCR (RSV, Flu A&B, Covid) Anterior Nasal Swab     Status: None   Collection Time: 05/17/23 10:40 AM   Specimen: Anterior Nasal Swab  Result Value Ref Range Status   SARS Coronavirus 2 by RT PCR NEGATIVE NEGATIVE Final    Comment: (NOTE) SARS-CoV-2 target nucleic acids are NOT DETECTED.  The SARS-CoV-2 RNA is generally detectable in upper respiratory specimens during the acute phase of infection. The lowest concentration of SARS-CoV-2 viral copies this assay can detect is 138 copies/mL. A negative result does not preclude SARS-Cov-2 infection and should not be used as the sole basis for treatment or other patient management decisions. A negative result may occur with  improper specimen collection/handling, submission of specimen other than nasopharyngeal swab, presence of viral mutation(s) within the areas targeted by this assay, and inadequate number of viral copies(<138 copies/mL). A negative result must be combined with clinical observations, patient history, and epidemiological information. The expected result is Negative.  Fact Sheet for Patients:  BloggerCourse.com  Fact Sheet for Healthcare Providers:  SeriousBroker.it  This test is no t yet approved or cleared by the [place] FDA and  has been authorized for detection and/or diagnosis of SARS-CoV-2 by FDA under an Emergency Use Authorization (EUA). This EUA will remain  in effect (meaning this test can  be used) for the  duration of the COVID-19 declaration under Section 564(b)(1) of the Act, 21 U.S.C.section 360bbb-3(b)(1), unless the authorization is terminated  or revoked sooner.       Influenza A by PCR NEGATIVE NEGATIVE Final   Influenza B by PCR NEGATIVE NEGATIVE Final    Comment: (NOTE) The Xpert Xpress SARS-CoV-2/FLU/RSV plus assay is intended as an aid in the diagnosis of influenza from Nasopharyngeal swab specimens and should not be used as a sole basis for treatment. Nasal washings and aspirates are unacceptable for Xpert Xpress SARS-CoV-2/FLU/RSV testing.  Fact Sheet for Patients: BloggerCourse.com  Fact Sheet for Healthcare Providers: SeriousBroker.it  This test is not yet approved or cleared by the [place] FDA and has been authorized for detection and/or diagnosis of SARS-CoV-2 by FDA under an Emergency Use Authorization (EUA). This EUA will remain in effect (meaning this test can be used) for the duration of the COVID-19 declaration under Section 564(b)(1) of the Act, 21 U.S.C. section 360bbb-3(b)(1), unless the authorization is terminated or revoked.     Resp Syncytial Virus by PCR NEGATIVE NEGATIVE Final    Comment: (NOTE) Fact Sheet for Patients: BloggerCourse.com  Fact Sheet for Healthcare Providers: SeriousBroker.it  This test is not yet approved or cleared by the [place] FDA and has been authorized for detection and/or diagnosis of SARS-CoV-2 by FDA under an Emergency Use Authorization (EUA). This EUA will remain in effect (meaning this test can be used) for the duration of the COVID-19 declaration under Section 564(b)(1) of the Act, 21 U.S.C. section 360bbb-3(b)(1), unless the authorization is terminated or revoked.  Performed at [facility], [address], [place], [place] [address]   Blood Culture (routine x 2)     Status: Abnormal    Collection Time: 05/17/23 12:57 PM   Specimen: Neck; Blood  Result Value Ref Range Status   Specimen Description   Final    NECK LEFT Performed at [facility], [address], [place], [place] [address]    Special Requests   Final    BOTTLES DRAWN AEROBIC AND ANAEROBIC Blood Culture adequate volume Performed at [facility], [address] [place], [place] [address]    Culture  Setup Time   Final    GRAM POSITIVE COCCI IN BOTH AEROBIC AND ANAEROBIC BOTTLES CRITICAL RESULT CALLED TO, READ BACK BY AND VERIFIED WITH: L POINDEXTER,PHARMD@0520  05/18/23 MK Performed at [facility], [address] [place], [place] [address]    Culture STREPTOCOCCUS PNEUMONIAE (A)  Final   Report Status 05/20/2023 FINAL  Final   Organism ID, Bacteria STREPTOCOCCUS PNEUMONIAE  Final      Susceptibility   Streptococcus pneumoniae - MIC*    ERYTHROMYCIN >=8 RESISTANT Resistant     LEVOFLOXACIN 0.5 SENSITIVE Sensitive     VANCOMYCIN 0.5 SENSITIVE Sensitive     PENICILLIN (meningitis) 0.5 RESISTANT Resistant     PENO - penicillin 0.5      PENICILLIN (non-meningitis) 0.5 SENSITIVE Sensitive     PENICILLIN (oral) 0.5 INTERMEDIATE Intermediate     CEFTRIAXONE (non-meningitis) 1 SENSITIVE Sensitive     CEFTRIAXONE (meningitis) 1 INTERMEDIATE Intermediate     * STREPTOCOCCUS PNEUMONIAE  Blood Culture ID Panel (Reflexed)     Status: Abnormal   Collection Time: 05/17/23 12:57 PM  Result Value Ref Range Status   Enterococcus faecalis NOT DETECTED NOT DETECTED Final   Enterococcus Faecium NOT DETECTED NOT DETECTED Final   Listeria monocytogenes NOT DETECTED NOT  DETECTED Final   Staphylococcus species NOT DETECTED NOT DETECTED Final   Staphylococcus aureus (BCID) NOT DETECTED NOT DETECTED Final   Staphylococcus epidermidis NOT DETECTED NOT DETECTED Final   Staphylococcus lugdunensis NOT DETECTED NOT DETECTED Final   Streptococcus species DETECTED (A) NOT DETECTED  Final    Comment: CRITICAL RESULT CALLED TO, READ BACK BY AND VERIFIED WITH: LPOINDEXTER,PHARMD@0520  05/18/23 MK    Streptococcus agalactiae NOT DETECTED NOT DETECTED Final   Streptococcus pneumoniae DETECTED (A) NOT DETECTED Final    Comment: CRITICAL RESULT CALLED TO, READ BACK BY AND VERIFIED WITH: L POINDEXTER,PHARMD@0520  05/18/23 MK    Streptococcus pyogenes NOT DETECTED NOT DETECTED Final   A.calcoaceticus-baumannii NOT DETECTED NOT DETECTED Final   Bacteroides fragilis NOT DETECTED NOT DETECTED Final   Enterobacterales NOT DETECTED NOT DETECTED Final   Enterobacter cloacae complex NOT DETECTED NOT DETECTED Final   Escherichia coli NOT DETECTED NOT DETECTED Final   Klebsiella aerogenes NOT DETECTED NOT DETECTED Final   Klebsiella oxytoca NOT DETECTED NOT DETECTED Final   Klebsiella pneumoniae NOT DETECTED NOT DETECTED Final   Proteus species NOT DETECTED NOT DETECTED Final   Salmonella species NOT DETECTED NOT DETECTED Final   Serratia marcescens NOT DETECTED NOT DETECTED Final   Haemophilus influenzae NOT DETECTED NOT DETECTED Final   Neisseria meningitidis NOT DETECTED NOT DETECTED Final   Pseudomonas aeruginosa NOT DETECTED NOT DETECTED Final   Stenotrophomonas maltophilia NOT DETECTED NOT DETECTED Final   Candida albicans NOT DETECTED NOT DETECTED Final   Candida auris NOT DETECTED NOT DETECTED Final   Candida glabrata NOT DETECTED NOT DETECTED Final   Candida krusei NOT DETECTED NOT DETECTED Final   Candida parapsilosis NOT DETECTED NOT DETECTED Final   Candida tropicalis NOT DETECTED NOT DETECTED Final   Cryptococcus neoformans/gattii NOT DETECTED NOT DETECTED Final    Comment: Performed at [facility], [address] [place], [place] [address]  Blood Culture (routine x 2)     Status: None   Collection Time: 05/17/23  8:49 PM   Specimen: BLOOD  Result Value Ref Range Status   Specimen Description   Final    BLOOD LEFT ANTECUBITAL Performed at [facility], [address] [place], [place] [address]    Special Requests   Final    BOTTLES DRAWN AEROBIC AND ANAEROBIC Blood Culture adequate volume Performed at [facility], [address], [place], [place] [address]    Culture   Final    NO GROWTH 5 DAYS Performed at [facility], [address] [place], [place] [address]    Report Status 05/22/2023 FINAL  Final    Antimicrobials: Anti-infectives (From admission, onward)    Start     Dose/Rate Route Frequency Ordered Stop   05/21/23 1000  amoxicillin-clavulanate (AUGMENTIN) 500-125 MG per tablet 1 tablet        1 tablet Oral 2 times daily 05/20/23 1028     05/18/23 1500  cefTRIAXone (ROCEPHIN) 1 g in sodium chloride 0.9 % 100 mL IVPB  Status:  Discontinued        1 g 200 mL/hr over 30 Minutes Intravenous Every 24 hours 05/17/23 1412 05/18/23 0542   05/18/23 1200  azithromycin (ZITHROMAX) 500 mg in sodium chloride 0.9 % 250 mL IVPB  Status:  Discontinued        500 mg 250 mL/hr over 60 Minutes Intravenous Every 24 hours 05/17/23 1412 05/18/23 1101   05/18/23 1000  cefTRIAXone (ROCEPHIN) 2 g in  sodium chloride 0.9 % 100 mL IVPB  Status:  Discontinued        2 g 200 mL/hr over 30 Minutes Intravenous Every 24 hours 05/18/23 0544 05/20/23 1028   05/17/23 1400  cefTRIAXone (ROCEPHIN) injection 1 g        1 g Intramuscular  Once 05/17/23 1346 05/17/23 1450   05/17/23 1300  cefTRIAXone (ROCEPHIN) 1 g in sodium chloride 0.9 % 100 mL IVPB  Status:  Discontinued        1 g 200 mL/hr over 30 Minutes Intravenous  Once 05/17/23 1257 05/17/23 2011   05/17/23 1300  azithromycin (ZITHROMAX) tablet 500 mg        500 mg Oral  Once 05/17/23 1257 05/17/23 1327      Culture/Microbiology    Component Value Date/Time   SDES  05/17/2023 2049    BLOOD LEFT ANTECUBITAL Performed at [facility], [address] [place], [place] [address]    SPECREQUEST  05/17/2023 2049    BOTTLES DRAWN AEROBIC AND ANAEROBIC Blood  Culture adequate volume Performed at [facility], [address] [place], [place] [address]    CULT  05/17/2023 2049    NO GROWTH 5 DAYS Performed at [facility], [address] [place], [place] [address]    REPTSTATUS 05/22/2023 FINAL 05/17/2023 2049    Other culture-see note  Radiology Studies: No results found.   LOS: 6 days   Lanae Boast, MD [facility]  05/23/2023, 11:55 AM

## 2023-05-24 DIAGNOSIS — J189 Pneumonia, unspecified organism: Secondary | ICD-10-CM | POA: Diagnosis not present

## 2023-05-24 DIAGNOSIS — Z7189 Other specified counseling: Secondary | ICD-10-CM

## 2023-05-24 DIAGNOSIS — A419 Sepsis, unspecified organism: Secondary | ICD-10-CM | POA: Diagnosis not present

## 2023-05-24 DIAGNOSIS — J918 Pleural effusion in other conditions classified elsewhere: Secondary | ICD-10-CM

## 2023-05-24 DIAGNOSIS — Z515 Encounter for palliative care: Secondary | ICD-10-CM | POA: Diagnosis not present

## 2023-05-24 DIAGNOSIS — Z66 Do not resuscitate: Secondary | ICD-10-CM

## 2023-05-24 LAB — CBC
HCT: 25.7 % — ABNORMAL LOW (ref 36.0–46.0)
Hemoglobin: 8.5 g/dL — ABNORMAL LOW (ref 12.0–15.0)
MCH: 34.4 pg — ABNORMAL HIGH (ref 26.0–34.0)
MCHC: 33.1 g/dL (ref 30.0–36.0)
MCV: 104 fL — ABNORMAL HIGH (ref 80.0–100.0)
Platelets: 442 10*3/uL — ABNORMAL HIGH (ref 150–400)
RBC: 2.47 MIL/uL — ABNORMAL LOW (ref 3.87–5.11)
RDW: 13.5 % (ref 11.5–15.5)
WBC: 16.1 10*3/uL — ABNORMAL HIGH (ref 4.0–10.5)
nRBC: 0 % (ref 0.0–0.2)

## 2023-05-24 LAB — BASIC METABOLIC PANEL
Anion gap: 6 (ref 5–15)
BUN: 16 mg/dL (ref 8–23)
CO2: 22 mmol/L (ref 22–32)
Calcium: 7.8 mg/dL — ABNORMAL LOW (ref 8.9–10.3)
Chloride: 105 mmol/L (ref 98–111)
Creatinine, Ser: 0.8 mg/dL (ref 0.44–1.00)
GFR, Estimated: >60 mL/min (ref >60)
Glucose, Bld: 122 mg/dL — ABNORMAL HIGH (ref 70–99)
Potassium: 3.8 mmol/L (ref 3.5–5.1)
Sodium: 133 mmol/L — ABNORMAL LOW (ref 135–145)

## 2023-05-24 MED ORDER — ORAL CARE MOUTH RINSE: 15.0000 mL | OROMUCOSAL | Status: DC | PRN

## 2023-05-24 MED ORDER — ORAL CARE MOUTH RINSE
15.0000 mL | OROMUCOSAL | Status: DC
  Administered 2023-05-24 – 2023-05-27 (×12): 15 mL via OROMUCOSAL

## 2023-05-24 MED ORDER — AMOXICILLIN-POT CLAVULANATE 875-125 MG PO TABS
1.0000 | ORAL_TABLET | Freq: Two times a day (BID) | ORAL | Status: DC
  Administered 2023-05-24 – 2023-05-27 (×6): 1 via ORAL
  Filled 2023-05-24 (×6): qty 1

## 2023-05-24 NOTE — Progress Notes (Signed)
[facility]  Date of Admission:  05/17/2023      Abx: 10/28-c amox-clav  10/25-28 ceftriaxone 10/25-26 azith   ASSESSMENT: 89 yo female admitted with sepsis and pna 10/25, complicated by right sided pleural effusion and strep pna bacteremia   Patient hard at hearing  Wbc improving Afebrile Stable  hemodynamics Appears deconditioned  10/28 chest ct showed large possibly loculated pleural effusion right side   05/22/23 id assessment Pulm/ccm following and patient's family (son) had consented to thoracentesis -- per pulmonology bedside US pleural effusion appears simple. First attempt thoracentesis unsuccessful as patient not able to be positioned right Moderate leukocytosis not improving Still clinically ill; prognosis guarded    05/24/23 id assessment On 10/31 pulm was able to do thoracentesis but only 50 cc removed and a large fissural loculation remains. Light's criteria suggest exudative fluid Cx of fluid so far ngtd but given the amount of abx, I suspect the strep pnae wont grow  Given our inability to do source control, I plan a long course of abx. Cure expectation guarded  Slight decrease in wbc by one day of unclear significance  PLAN: Continue amox-clav; I would recommend 6 weeks from 05/24/23 Id clinic f/u on 12/2 @ 245pm with me Appreciate pulm/ccm management High mortality/morbidity case; prognosis guarded Id will sign off Discussed with primary team     Principal Problem:   Sepsis due to pneumonia [facility]) Active Problems:   Pleural effusion   Bacteremia   No Known Allergies  Scheduled Meds:  alteplase  2 mg Intracatheter Once   amoxicillin-clavulanate  1 tablet Oral Q12H   Chlorhexidine Gluconate Cloth  6 each Topical Daily   ferrous sulfate  325 mg Oral Q breakfast   heparin injection (subcutaneous)  5,000 Units Subcutaneous Q8H   LORazepam  0.5 mg Oral QHS   metoprolol succinate  25 mg Oral Daily    multivitamin with minerals  1 tablet Oral Daily   mouth rinse  15 mL Mouth Rinse 4 times per day   sertraline  25 mg Oral Daily   sodium chloride flush  10-40 mL Intracatheter Q12H   Continuous Infusions: PRN Meds:.acetaminophen **OR** acetaminophen, albuterol, guaiFENesin-dextromethorphan, lip balm, mouth rinse, senna-docusate, sodium chloride, sodium chloride flush   SUBJECTIVE: Slight improvement in wbc by one day Patient appears more awake today but told us she feels the same, pain upper abd from coughing and pain right chest  Lots of cough during exam    Review of Systems: ROS All other ROS was negative, except mentioned above     OBJECTIVE: Vitals:   05/24/23 0011 05/24/23 0504 05/24/23 0825 05/24/23 1218  BP: 139/70 (!) 158/81 (!) 152/90 (!) 141/79  Pulse: 78 100 89 78  Resp: 16 19 (!) 24 (!) 24  Temp: 98 F (36.7 C) 98.5 F (36.9 C) 97.6 F (36.4 C) (!) 97.5 F (36.4 C)  TempSrc: Oral Oral Oral Oral  SpO2: 94% 94% 95% 96%  Weight:      Height:       Body mass index is 21.06 kg/m.  Physical Exam  General/constitutional: elderly female in bed, hard at hearing, but verbal, alert, cooperative; noted wet sounding cough at times HEENT: Normocephalic, PER, Conj Clear, EOMI CV: rrr no mrg Lungs: coarse breath sound; normal respiratory effort; on 2 liters oxygen via [place] Abd: Soft, Nontender Ext: no edema Skin: No Rash Neuro: generalized weakness MSK: no peripheral joint swelling/tenderness/warmth  Lab Results Lab Results  Component Value Date   WBC 16.1 (H) 05/24/2023   HGB 8.5 (L) 05/24/2023   HCT 25.7 (L) 05/24/2023   MCV 104.0 (H) 05/24/2023   PLT 442 (H) 05/24/2023    Lab Results  Component Value Date   CREATININE 0.80 05/24/2023   BUN 16 05/24/2023   NA 133 (L) 05/24/2023   K 3.8 05/24/2023   CL 105 05/24/2023   CO2 22 05/24/2023    Lab Results  Component Value Date   ALT 16 05/17/2023   AST 19 05/17/2023   ALKPHOS 127 (H)  05/17/2023   BILITOT 1.4 (H) 05/17/2023      Microbiology: Recent Results (from the past 240 hour(s))  Resp panel by RT-PCR (RSV, Flu A&B, Covid) Anterior Nasal Swab     Status: None   Collection Time: 05/17/23 10:40 AM   Specimen: Anterior Nasal Swab  Result Value Ref Range Status   SARS Coronavirus 2 by RT PCR NEGATIVE NEGATIVE Final    Comment: (NOTE) SARS-CoV-2 target nucleic acids are NOT DETECTED.  The SARS-CoV-2 RNA is generally detectable in upper respiratory specimens during the acute phase of infection. The lowest concentration of SARS-CoV-2 viral copies this assay can detect is 138 copies/mL. A negative result does not preclude SARS-Cov-2 infection and should not be used as the sole basis for treatment or other patient management decisions. A negative result may occur with  improper specimen collection/handling, submission of specimen other than nasopharyngeal swab, presence of viral mutation(s) within the areas targeted by this assay, and inadequate number of viral copies(<138 copies/mL). A negative result must be combined with clinical observations, patient history, and epidemiological information. The expected result is Negative.  Fact Sheet for Patients:  BloggerCourse.com  Fact Sheet for Healthcare Providers:  SeriousBroker.it  This test is no t yet approved or cleared by the [place] FDA and  has been authorized for detection and/or diagnosis of SARS-CoV-2 by FDA under an Emergency Use Authorization (EUA). This EUA will remain  in effect (meaning this test can be used) for the duration of the COVID-19 declaration under Section 564(b)(1) of the Act, 21 U.S.C.section 360bbb-3(b)(1), unless the authorization is terminated  or revoked sooner.       Influenza A by PCR NEGATIVE NEGATIVE Final   Influenza B by PCR NEGATIVE NEGATIVE Final    Comment: (NOTE) The Xpert Xpress SARS-CoV-2/FLU/RSV plus assay is  intended as an aid in the diagnosis of influenza from Nasopharyngeal swab specimens and should not be used as a sole basis for treatment. Nasal washings and aspirates are unacceptable for Xpert Xpress SARS-CoV-2/FLU/RSV testing.  Fact Sheet for Patients: BloggerCourse.com  Fact Sheet for Healthcare Providers: SeriousBroker.it  This test is not yet approved or cleared by the [place] FDA and has been authorized for detection and/or diagnosis of SARS-CoV-2 by FDA under an Emergency Use Authorization (EUA). This EUA will remain in effect (meaning this test can be used) for the duration of the COVID-19 declaration under Section 564(b)(1) of the Act, 21 U.S.C. section 360bbb-3(b)(1), unless the authorization is terminated or revoked.     Resp Syncytial Virus by PCR NEGATIVE NEGATIVE Final    Comment: (NOTE) Fact Sheet for Patients: BloggerCourse.com  Fact Sheet for Healthcare Providers: SeriousBroker.it  This test is not yet approved or cleared by the [place] FDA and has been authorized for detection and/or diagnosis of SARS-CoV-2 by FDA under an Emergency Use Authorization (EUA). This EUA will remain in effect (meaning this test  can be used) for the duration of the COVID-19 declaration under Section 564(b)(1) of the Act, 21 U.S.C. section 360bbb-3(b)(1), unless the authorization is terminated or revoked.  Performed at [facility], [address] [place], [place] [address]   Blood Culture (routine x 2)     Status: Abnormal   Collection Time: 05/17/23 12:57 PM   Specimen: Neck; Blood  Result Value Ref Range Status   Specimen Description   Final    NECK LEFT Performed at [facility], [address] [place], [place] [address]    Special Requests   Final    BOTTLES DRAWN AEROBIC AND ANAEROBIC Blood Culture adequate  volume Performed at [facility], [address], [place], [place] [address]    Culture  Setup Time   Final    GRAM POSITIVE COCCI IN BOTH AEROBIC AND ANAEROBIC BOTTLES CRITICAL RESULT CALLED TO, READ BACK BY AND VERIFIED WITH: L POINDEXTER,PHARMD@0520  05/18/23 MK Performed at [facility], [address] [place], [place] [address]    Culture STREPTOCOCCUS PNEUMONIAE (A)  Final   Report Status 05/20/2023 FINAL  Final   Organism ID, Bacteria STREPTOCOCCUS PNEUMONIAE  Final      Susceptibility   Streptococcus pneumoniae - MIC*    ERYTHROMYCIN >=8 RESISTANT Resistant     LEVOFLOXACIN 0.5 SENSITIVE Sensitive     VANCOMYCIN 0.5 SENSITIVE Sensitive     PENICILLIN (meningitis) 0.5 RESISTANT Resistant     PENO - penicillin 0.5      PENICILLIN (non-meningitis) 0.5 SENSITIVE Sensitive     PENICILLIN (oral) 0.5 INTERMEDIATE Intermediate     CEFTRIAXONE (non-meningitis) 1 SENSITIVE Sensitive     CEFTRIAXONE (meningitis) 1 INTERMEDIATE Intermediate     * STREPTOCOCCUS PNEUMONIAE  Blood Culture ID Panel (Reflexed)     Status: Abnormal   Collection Time: 05/17/23 12:57 PM  Result Value Ref Range Status   Enterococcus faecalis NOT DETECTED NOT DETECTED Final   Enterococcus Faecium NOT DETECTED NOT DETECTED Final   Listeria monocytogenes NOT DETECTED NOT DETECTED Final   Staphylococcus species NOT DETECTED NOT DETECTED Final   Staphylococcus aureus (BCID) NOT DETECTED NOT DETECTED Final   Staphylococcus epidermidis NOT DETECTED NOT DETECTED Final   Staphylococcus lugdunensis NOT DETECTED NOT DETECTED Final   Streptococcus species DETECTED (A) NOT DETECTED Final    Comment: CRITICAL RESULT CALLED TO, READ BACK BY AND VERIFIED WITH: LPOINDEXTER,PHARMD@0520  05/18/23 MK    Streptococcus agalactiae NOT DETECTED NOT DETECTED Final   Streptococcus pneumoniae DETECTED (A) NOT DETECTED Final    Comment: CRITICAL RESULT CALLED TO, READ BACK BY AND VERIFIED WITH: L  POINDEXTER,PHARMD@0520  05/18/23 MK    Streptococcus pyogenes NOT DETECTED NOT DETECTED Final   A.calcoaceticus-baumannii NOT DETECTED NOT DETECTED Final   Bacteroides fragilis NOT DETECTED NOT DETECTED Final   Enterobacterales NOT DETECTED NOT DETECTED Final   Enterobacter cloacae complex NOT DETECTED NOT DETECTED Final   Escherichia coli NOT DETECTED NOT DETECTED Final   Klebsiella aerogenes NOT DETECTED NOT DETECTED Final   Klebsiella oxytoca NOT DETECTED NOT DETECTED Final   Klebsiella pneumoniae NOT DETECTED NOT DETECTED Final   Proteus species NOT DETECTED NOT DETECTED Final   Salmonella species NOT DETECTED NOT DETECTED Final   Serratia marcescens NOT DETECTED NOT DETECTED Final   Haemophilus influenzae NOT DETECTED NOT DETECTED Final   Neisseria meningitidis NOT DETECTED NOT DETECTED Final   Pseudomonas aeruginosa NOT DETECTED NOT DETECTED Final   Stenotrophomonas maltophilia NOT DETECTED NOT DETECTED Final  Candida albicans NOT DETECTED NOT DETECTED Final   Candida auris NOT DETECTED NOT DETECTED Final   Candida glabrata NOT DETECTED NOT DETECTED Final   Candida krusei NOT DETECTED NOT DETECTED Final   Candida parapsilosis NOT DETECTED NOT DETECTED Final   Candida tropicalis NOT DETECTED NOT DETECTED Final   Cryptococcus neoformans/gattii NOT DETECTED NOT DETECTED Final    Comment: Performed at [facility], [address], [place], [place] [address]  Blood Culture (routine x 2)     Status: None   Collection Time: 05/17/23  8:49 PM   Specimen: BLOOD  Result Value Ref Range Status   Specimen Description   Final    BLOOD LEFT ANTECUBITAL Performed at [facility], [address] [place], [place] [address]    Special Requests   Final    BOTTLES DRAWN AEROBIC AND ANAEROBIC Blood Culture adequate volume Performed at [facility], [address], [place], [place] [address]    Culture   Final    NO GROWTH 5 DAYS Performed at [facility], [address] [place], [place] [address]    Report Status 05/22/2023 FINAL  Final  Pleural fluid culture w Gram Stain     Status: None (Preliminary result)   Collection Time: 05/23/23  3:01 PM   Specimen: Pleural Fluid  Result Value Ref Range Status   Specimen Description   Final    PLEURAL Performed at [facility], [address] [place], [place] [address]    Special Requests   Final    NONE Performed at [facility], [address] [place], [place] [address]    Gram Stain NO WBC SEEN NO ORGANISMS SEEN CYTOSPIN SMEAR   Final   Culture   Final    NO GROWTH < 24 HOURS Performed at [facility], [address] [place], [place] [address]    Report Status PENDING  Incomplete     Serology:   Imaging: If present, new imagings (plain films, ct scans, and mri) have been personally visualized and interpreted; radiology reports have been reviewed. Decision making incorporated into the Impression / Recommendations.  10/31 cxr FINDINGS: No pneumothorax is noted. Stable loculated pleural effusion is noted in right lung base which is slightly decreased in size.   IMPRESSION: No pneumothorax status post right thoracentesis.  10/28 chest ct 1. Moderate right and small left pleural effusions, including a loculated right-sided fissural component. 2. Complete collapse of the right lower lobe and partial collapse of the right middle lobe and left lower lobe secondary to pleural effusions. 3. Age-indeterminate severe compression deformity of T9. Correlate for point tenderness. 4. Severe aortic Atherosclerosis (ICD10-I70.0) with area of tortuosity and focal narrowing involving the descending thoracic aorta, likely unchanged when compared with calcification pattern on prior thoracic spine radiograph dated Dec 14, 2020, although lack of IV contrast limits evaluation.   10/25 cxr Enlarged heart with dilated calcified aorta.   Vascular congestion.   Right-sided pleural effusion. Separate rounded opacity in the right lung base could be fluid tracking along fissure but underlying lesion is not excluded. Recommend short follow-up or CT   Raymondo Band, MD [facility] [phone] pager    05/24/2023, 1:12 PM

## 2023-05-24 NOTE — Progress Notes (Signed)
PROGRESS NOTE Alicia Schwartz  NWG:[id number] DOB: Dec 15, 1930 DOA: 05/17/2023 PCP: Linus Galas, NP  Brief Narrative/Hospital Course: 89 year old F with PMH of paroxysmal SVT, HTN and diminished hearing brought to ED after a social worker called EMS due to generalized weakness, acute on chronic low back pain and ambulatory dysfunction.  Reportedly saturating at 88% when EMS arrived.  Other vitals were okay.  She has leukocytosis to 27 with left shift.  CXR with small right-sided pleural effusion and possible consolidation versus mass.  Blood cultures obtained.  Patient was started on IV ceftriaxone and azithromycin, and admitted for severe sepsis due to community-acquired pneumonia.   The next day, blood culture with Streptococcus pneumonia in 1 out of 2 bottles.  Azithromycin discontinued.  ID consulted, advised CT to evaluate pleural effusion and continue ceftriaxone ID following closely, CT chest 10/28 showed: Moderate right and small left pleural effusion including loculated right-sided fissural component complete collapse of the right lower lobe and partial collapse RML and left lower lobe due to pleural effusion-PCCM consulted: Recommended chest tube placement given bacteremia for clearance of infusing and reduce the risk of trapped lung. Palliative care consulted as patient is high risk for decompensation.  Unable to do thoracentesis due to positioning. Seen by speech therapy cannot rule out aspiration clinically overall tolerating p.o. 10/31>> PCCM bedside paracentesis attempted and 50 cc of straw-colored fluid was drained and sent for analysis-Gram stain no organism, LDH elevated 455 protein 3.1, total nucleated cells 194 with 86% neutrophil culture and cytology in process  Subjective: Seen and examined this morning She is alert awake mild shortness of breath and coughing Is very hard of hearing. Afebrile, BP stable, labs showed a leukocytosis that is slightly better  at 16k  Assessment  and Plan: Principal Problem:   Sepsis due to pneumonia [facility]) Active Problems:   Parapneumonic effusion   Bacteremia   Severe sepsis due to pneumonia and Streptococcus pneumoniae bacteremia CAP Loculated right-sided/Moderate right and small left pleural effusion: Blood culture with Streptococcus pneumoniae-source felt to be CAP, CT scan shows component of loculation, PCCM consulted for chest tube consideration, thoracentesis attempted but no safe window due to repositioning.  At this time monitoring regularly to see if able to do thoracentesis.  ID is following closely.  Echo without gross abnormalities.  Antibiotic now changed to Augmentin, continue supplemental oxygen. She is at risk of decompensation further mortality and morbidity Palliative care following-Has significant burden of empyema still and planning for 6 weeks of antibiotics per ID, remains at risk of decompensation.  Pulmonary has seen the patient can consider CT chest tube later if needed son did not want chest tube right away. Recent Labs  Lab 05/20/23 0322 05/21/23 0327 05/22/23 0323 05/23/23 0221 05/24/23 0313  WBC 18.6* 18.0* 18.2* 18.3* 16.1*    Acute hypoxic respiratory failure due to pneumonia: Due to CAP, and pleural effusion as above.  Wean oxygen as tolerated to room air.    AKI on CKD 3B: Baseline creatinine 1.2-1.3, on losartan, AKI resolved.  Off IV fluids. monitor renal function Recent Labs    02/07/23 1541 05/17/23 1303 05/18/23 0158 05/19/23 0343 05/20/23 0322 05/21/23 0327 05/22/23 0323 05/23/23 0221 05/24/23 0313  BUN 25* 37* 40* 31* 25* 21 18 18 16   CREATININE 1.31* 1.17* 1.50* 1.12* 0.99 0.96 0.93 0.76 0.80  CO2 26 25 24  21* 24 24 23 22 22   K 3.9 3.0* 3.3* 4.4 4.3 4.4 4.2 4.0 3.8    New onset A-fib: Likely from  sepsis, TSH normal, now started on Toprol not on anticoagulation due to fall risk after discussing risk benefits with patient's sons by previous provider, follow-up echocardiogram  is stable.  Hypokalemia: Resolved  Anemia of renal disease: Hemoglobin being monitored transfuse for less than 7 g continue iron supplement.  Ambulatory dysfunction and generalized weakness deconditioning: PT OT evaluation TOC consult, social worker concerned about patient being home alone  Goals of care: DNR, severely diminished hearing.  Palliative care has also been consulted.  DVT prophylaxis: heparin injection 5,000 Units Start: 05/18/23 2200 SCDs Start: 05/17/23 1411 Code Status:   Code Status: Do not attempt resuscitation (DNR) PRE-ARREST INTERVENTIONS DESIRED Family Communication: plan of care discussed with patient at bedside. Patient status is: Inpatient because of pneumonia Level of care: Telemetry   Dispo: The patient is from: home            Anticipated disposition: SNF over the weekend if remains stable Objective: Vitals last 24 hrs: Vitals:   05/24/23 0011 05/24/23 0504 05/24/23 0825 05/24/23 1218  BP: 139/70 (!) 158/81 (!) 152/90 (!) 141/79  Pulse: 78 100 89 78  Resp: 16 19 (!) 24 (!) 24  Temp: 98 F (36.7 C) 98.5 F (36.9 C) 97.6 F (36.4 C) (!) 97.5 F (36.4 C)  TempSrc: Oral Oral Oral Oral  SpO2: 94% 94% 95% 96%  Weight:      Height:       Weight change:   Physical Examination: General exam: alert awake, oriented to self, place  HEENT:Oral mucosa moist, Ear/Nose WNL grossly Respiratory system: Bilaterally diminished breath sounds at the bases, wheezing intermittently  Cardiovascular system: S1 & S2 +, No JVD. Gastrointestinal system: Abdomen soft,NT,ND, BS+ Nervous System: Alert, awake, moving all extremities,and following commands. Extremities: LE edema neg,distal peripheral pulses palpable and warm.  Skin: No rashes,no icterus. MSK: Normal muscle bulk,tone, power    Medications reviewed:  Scheduled Meds:  alteplase  2 mg Intracatheter Once   amoxicillin-clavulanate  1 tablet Oral Q12H   Chlorhexidine Gluconate Cloth  6 each Topical Daily    ferrous sulfate  325 mg Oral Q breakfast   heparin injection (subcutaneous)  5,000 Units Subcutaneous Q8H   LORazepam  0.5 mg Oral QHS   metoprolol succinate  25 mg Oral Daily   multivitamin with minerals  1 tablet Oral Daily   mouth rinse  15 mL Mouth Rinse 4 times per day   sertraline  25 mg Oral Daily   sodium chloride flush  10-40 mL Intracatheter Q12H   Continuous Infusions:  Diet Order             Diet regular Room service appropriate? Yes; Fluid consistency: Thin  Diet effective now                  Intake/Output Summary (Last 24 hours) at 05/24/2023 1344 Last data filed at 05/24/2023 0930 Gross per 24 hour  Intake 370 ml  Output 450 ml  Net -80 ml   Net IO Since Admission: 986.15 mL [05/24/23 1344]  Wt Readings from Last 3 Encounters:  05/17/23 47.3 kg  02/24/22 46.9 kg  01/26/22 47.6 kg     Unresulted Labs (From admission, onward)     Start     Ordered   05/23/23 1501  Pathologist smear review  Once,   R        05/23/23 1501   05/21/23 0500  CBC  Daily,   R     Question:  Specimen collection method  Answer:  IV Team=IV Team collect   05/20/23 1256   05/21/23 0500  Basic metabolic panel  Daily,   R     Question:  Specimen collection method  Answer:  IV Team=IV Team collect   05/20/23 1256          Data Reviewed: I have personally reviewed following labs and imaging studies CBC: Recent Labs  Lab 05/19/23 0343 05/20/23 0322 05/21/23 0327 05/22/23 0323 05/23/23 0221 05/24/23 0313  WBC 18.2* 18.6* 18.0* 18.2* 18.3* 16.1*  NEUTROABS 15.4*  --   --   --   --   --   HGB 9.8* 9.2* 9.0* 9.6* 9.0* 8.5*  HCT 28.5* 27.5* 27.0* 29.4* 27.1* 25.7*  MCV 100.4* 103.4* 103.1* 103.9* 103.0* 104.0*  PLT 290 331 333 383 368 442*   Basic Metabolic Panel: Recent Labs  Lab 05/19/23 0343 05/20/23 0322 05/21/23 0327 05/22/23 0323 05/23/23 0221 05/24/23 0313  NA 135 136 135 134* 134* 133*  K 4.4 4.3 4.4 4.2 4.0 3.8  CL 104 107 106 105 105 105  CO2 21* 24  24 23 22 22   GLUCOSE 135* 96 98 94 96 122*  BUN 31* 25* 21 18 18 16   CREATININE 1.12* 0.99 0.96 0.93 0.76 0.80  CALCIUM 8.4* 8.1* 8.0* 8.0* 7.9* 7.8*  MG 2.1 2.2  --   --   --   --   PHOS 1.8* 4.1  --   --   --   --    GFR: Estimated Creatinine Clearance: 31.9 mL/min (by C-G formula based on SCr of 0.8 mg/dL). Liver Function Tests: Recent Labs  Lab 05/19/23 0343 05/20/23 0322 05/23/23 1819  PROT  --   --  5.9*  ALBUMIN 2.4* 2.2*  --   CBG: Recent Labs  Lab 05/19/23 1129 05/21/23 1055  GLUCAP 150* 97   Lipid Profile: No results for input(s): "CHOL", "HDL", "LDLCALC", "TRIG", "CHOLHDL", "LDLDIRECT" in the last 72 hours. Thyroid Function Tests: No results for input(s): "TSH", "T4TOTAL", "FREET4", "T3FREE", "THYROIDAB" in the last 72 hours.  Sepsis Labs: No results for input(s): "PROCALCITON", "LATICACIDVEN" in the last 168 hours.   Recent Results (from the past 240 hour(s))  Resp panel by RT-PCR (RSV, Flu A&B, Covid) Anterior Nasal Swab     Status: None   Collection Time: 05/17/23 10:40 AM   Specimen: Anterior Nasal Swab  Result Value Ref Range Status   SARS Coronavirus 2 by RT PCR NEGATIVE NEGATIVE Final    Comment: (NOTE) SARS-CoV-2 target nucleic acids are NOT DETECTED.  The SARS-CoV-2 RNA is generally detectable in upper respiratory specimens during the acute phase of infection. The lowest concentration of SARS-CoV-2 viral copies this assay can detect is 138 copies/mL. A negative result does not preclude SARS-Cov-2 infection and should not be used as the sole basis for treatment or other patient management decisions. A negative result may occur with  improper specimen collection/handling, submission of specimen other than nasopharyngeal swab, presence of viral mutation(s) within the areas targeted by this assay, and inadequate number of viral copies(<138 copies/mL). A negative result must be combined with clinical observations, patient history, and  epidemiological information. The expected result is Negative.  Fact Sheet for Patients:  BloggerCourse.com  Fact Sheet for Healthcare Providers:  SeriousBroker.it  This test is no t yet approved or cleared by the [place] FDA and  has been authorized for detection and/or diagnosis of SARS-CoV-2 by FDA under an Emergency Use Authorization (EUA). This EUA will remain  in  effect (meaning this test can be used) for the duration of the COVID-19 declaration under Section 564(b)(1) of the Act, 21 U.S.C.section 360bbb-3(b)(1), unless the authorization is terminated  or revoked sooner.       Influenza A by PCR NEGATIVE NEGATIVE Final   Influenza B by PCR NEGATIVE NEGATIVE Final    Comment: (NOTE) The Xpert Xpress SARS-CoV-2/FLU/RSV plus assay is intended as an aid in the diagnosis of influenza from Nasopharyngeal swab specimens and should not be used as a sole basis for treatment. Nasal washings and aspirates are unacceptable for Xpert Xpress SARS-CoV-2/FLU/RSV testing.  Fact Sheet for Patients: BloggerCourse.com  Fact Sheet for Healthcare Providers: SeriousBroker.it  This test is not yet approved or cleared by the [place] FDA and has been authorized for detection and/or diagnosis of SARS-CoV-2 by FDA under an Emergency Use Authorization (EUA). This EUA will remain in effect (meaning this test can be used) for the duration of the COVID-19 declaration under Section 564(b)(1) of the Act, 21 U.S.C. section 360bbb-3(b)(1), unless the authorization is terminated or revoked.     Resp Syncytial Virus by PCR NEGATIVE NEGATIVE Final    Comment: (NOTE) Fact Sheet for Patients: BloggerCourse.com  Fact Sheet for Healthcare Providers: SeriousBroker.it  This test is not yet approved or cleared by the [place] FDA and has been  authorized for detection and/or diagnosis of SARS-CoV-2 by FDA under an Emergency Use Authorization (EUA). This EUA will remain in effect (meaning this test can be used) for the duration of the COVID-19 declaration under Section 564(b)(1) of the Act, 21 U.S.C. section 360bbb-3(b)(1), unless the authorization is terminated or revoked.  Performed at [facility], [address], [place], [place] [address]   Blood Culture (routine x 2)     Status: Abnormal   Collection Time: 05/17/23 12:57 PM   Specimen: Neck; Blood  Result Value Ref Range Status   Specimen Description   Final    NECK LEFT Performed at [facility], [address] [place], [place] [address]    Special Requests   Final    BOTTLES DRAWN AEROBIC AND ANAEROBIC Blood Culture adequate volume Performed at [facility], [address], [place], [place] [address]    Culture  Setup Time   Final    GRAM POSITIVE COCCI IN BOTH AEROBIC AND ANAEROBIC BOTTLES CRITICAL RESULT CALLED TO, READ BACK BY AND VERIFIED WITH: L POINDEXTER,PHARMD@0520  05/18/23 MK Performed at [facility], [address], [place], [place] [address]    Culture STREPTOCOCCUS PNEUMONIAE (A)  Final   Report Status 05/20/2023 FINAL  Final   Organism ID, Bacteria STREPTOCOCCUS PNEUMONIAE  Final      Susceptibility   Streptococcus pneumoniae - MIC*    ERYTHROMYCIN >=8 RESISTANT Resistant     LEVOFLOXACIN 0.5 SENSITIVE Sensitive     VANCOMYCIN 0.5 SENSITIVE Sensitive     PENICILLIN (meningitis) 0.5 RESISTANT Resistant     PENO - penicillin 0.5      PENICILLIN (non-meningitis) 0.5 SENSITIVE Sensitive     PENICILLIN (oral) 0.5 INTERMEDIATE Intermediate     CEFTRIAXONE (non-meningitis) 1 SENSITIVE Sensitive     CEFTRIAXONE (meningitis) 1 INTERMEDIATE Intermediate     * STREPTOCOCCUS PNEUMONIAE  Blood Culture ID Panel (Reflexed)     Status: Abnormal   Collection Time: 05/17/23 12:57 PM  Result  Value Ref Range Status   Enterococcus faecalis NOT DETECTED NOT DETECTED Final   Enterococcus Faecium NOT DETECTED NOT DETECTED Final  Listeria monocytogenes NOT DETECTED NOT DETECTED Final   Staphylococcus species NOT DETECTED NOT DETECTED Final   Staphylococcus aureus (BCID) NOT DETECTED NOT DETECTED Final   Staphylococcus epidermidis NOT DETECTED NOT DETECTED Final   Staphylococcus lugdunensis NOT DETECTED NOT DETECTED Final   Streptococcus species DETECTED (A) NOT DETECTED Final    Comment: CRITICAL RESULT CALLED TO, READ BACK BY AND VERIFIED WITH: LPOINDEXTER,PHARMD@0520  05/18/23 MK    Streptococcus agalactiae NOT DETECTED NOT DETECTED Final   Streptococcus pneumoniae DETECTED (A) NOT DETECTED Final    Comment: CRITICAL RESULT CALLED TO, READ BACK BY AND VERIFIED WITH: L POINDEXTER,PHARMD@0520  05/18/23 MK    Streptococcus pyogenes NOT DETECTED NOT DETECTED Final   A.calcoaceticus-baumannii NOT DETECTED NOT DETECTED Final   Bacteroides fragilis NOT DETECTED NOT DETECTED Final   Enterobacterales NOT DETECTED NOT DETECTED Final   Enterobacter cloacae complex NOT DETECTED NOT DETECTED Final   Escherichia coli NOT DETECTED NOT DETECTED Final   Klebsiella aerogenes NOT DETECTED NOT DETECTED Final   Klebsiella oxytoca NOT DETECTED NOT DETECTED Final   Klebsiella pneumoniae NOT DETECTED NOT DETECTED Final   Proteus species NOT DETECTED NOT DETECTED Final   Salmonella species NOT DETECTED NOT DETECTED Final   Serratia marcescens NOT DETECTED NOT DETECTED Final   Haemophilus influenzae NOT DETECTED NOT DETECTED Final   Neisseria meningitidis NOT DETECTED NOT DETECTED Final   Pseudomonas aeruginosa NOT DETECTED NOT DETECTED Final   Stenotrophomonas maltophilia NOT DETECTED NOT DETECTED Final   Candida albicans NOT DETECTED NOT DETECTED Final   Candida auris NOT DETECTED NOT DETECTED Final   Candida glabrata NOT DETECTED NOT DETECTED Final   Candida krusei NOT DETECTED NOT DETECTED  Final   Candida parapsilosis NOT DETECTED NOT DETECTED Final   Candida tropicalis NOT DETECTED NOT DETECTED Final   Cryptococcus neoformans/gattii NOT DETECTED NOT DETECTED Final    Comment: Performed at [facility], [address] [place], [place] [address]  Blood Culture (routine x 2)     Status: None   Collection Time: 05/17/23  8:49 PM   Specimen: BLOOD  Result Value Ref Range Status   Specimen Description   Final    BLOOD LEFT ANTECUBITAL Performed at [facility], [address] [place], [place] [address]    Special Requests   Final    BOTTLES DRAWN AEROBIC AND ANAEROBIC Blood Culture adequate volume Performed at [facility], [address] [place], [place] [address]    Culture   Final    NO GROWTH 5 DAYS Performed at [facility], [address], [place], [place] [address]    Report Status 05/22/2023 FINAL  Final  Pleural fluid culture w Gram Stain     Status: None (Preliminary result)   Collection Time: 05/23/23  3:01 PM   Specimen: Pleural Fluid  Result Value Ref Range Status   Specimen Description   Final    PLEURAL Performed at [facility], [address] [place], [place] [address]    Special Requests   Final    NONE Performed at [facility], [address], [place], [place] [address]    Gram Stain NO WBC SEEN NO ORGANISMS SEEN CYTOSPIN SMEAR   Final   Culture   Final    NO GROWTH < 24 HOURS Performed at [facility], [address], [place], [place] [address]    Report Status PENDING  Incomplete    Antimicrobials: Anti-infectives (From admission, onward)  Start     Dose/Rate Route Frequency Ordered Stop   05/24/23 1000  amoxicillin-clavulanate (AUGMENTIN) 875-125 MG per tablet 1 tablet        1 tablet Oral Every 12 hours 05/24/23 0828     05/21/23 1000  amoxicillin-clavulanate (AUGMENTIN) 500-125 MG per tablet 1 tablet  Status:  Discontinued        1 tablet Oral 2  times daily 05/20/23 1028 05/24/23 0828   05/18/23 1500  cefTRIAXone (ROCEPHIN) 1 g in sodium chloride 0.9 % 100 mL IVPB  Status:  Discontinued        1 g 200 mL/hr over 30 Minutes Intravenous Every 24 hours 05/17/23 1412 05/18/23 0542   05/18/23 1200  azithromycin (ZITHROMAX) 500 mg in sodium chloride 0.9 % 250 mL IVPB  Status:  Discontinued        500 mg 250 mL/hr over 60 Minutes Intravenous Every 24 hours 05/17/23 1412 05/18/23 1101   05/18/23 1000  cefTRIAXone (ROCEPHIN) 2 g in sodium chloride 0.9 % 100 mL IVPB  Status:  Discontinued        2 g 200 mL/hr over 30 Minutes Intravenous Every 24 hours 05/18/23 0544 05/20/23 1028   05/17/23 1400  cefTRIAXone (ROCEPHIN) injection 1 g        1 g Intramuscular  Once 05/17/23 1346 05/17/23 1450   05/17/23 1300  cefTRIAXone (ROCEPHIN) 1 g in sodium chloride 0.9 % 100 mL IVPB  Status:  Discontinued        1 g 200 mL/hr over 30 Minutes Intravenous  Once 05/17/23 1257 05/17/23 2011   05/17/23 1300  azithromycin (ZITHROMAX) tablet 500 mg        500 mg Oral  Once 05/17/23 1257 05/17/23 1327      Culture/Microbiology    Component Value Date/Time   SDES  05/23/2023 1501    PLEURAL Performed at [facility], [address], [place], [place] [address]    SPECREQUEST  05/23/2023 1501    NONE Performed at [facility], [address], [place], [place] [address]    CULT  05/23/2023 1501    NO GROWTH < 24 HOURS Performed at [facility], [address] [place], [place] [address]    REPTSTATUS PENDING 05/23/2023 1501    Other culture-see note  Radiology Studies: DG CHEST PORT 1 VIEW  Result Date: 05/23/2023 CLINICAL DATA:  Status post thoracentesis. EXAM: PORTABLE CHEST 1 VIEW COMPARISON:  May 17, 2023. FINDINGS: No pneumothorax is noted. Stable loculated pleural effusion is noted in right lung base which is slightly decreased in size. IMPRESSION: No pneumothorax status post right thoracentesis.  Electronically Signed   By: Lupita Raider M.D.   On: 05/23/2023 17:17     LOS: 7 days   Lanae Boast, MD [facility]  05/24/2023, 1:44 PM

## 2023-05-24 NOTE — Progress Notes (Signed)
  Daily Progress Note   Patient Name: Alicia Schwartz       Date: 05/24/2023 DOB: [birth year]  Age: 89 y.o. MRN#: [id number] Attending Physician: Lanae Boast, MD Primary Care Physician: Linus Galas, NP Admit Date: 05/17/2023 Length of Stay: 7 days  Reason for Consultation/Follow-up: {Reason for Consult:23484}  HPI/Patient Profile:  ***  Subjective:   Subjective: Chart Reviewed. Updates received. Patient Assessed. Created space and opportunity for patient  and family to explore thoughts and feelings regarding current medical situation.  Today's Discussion: ***  Review of Systems  Objective:   Vital Signs:  BP (!) 141/79 (BP Location: Left Arm)   Pulse 78   Temp (!) 97.5 F (36.4 C) (Oral)   Resp (!) 24   Ht 4\' 11"  (1.499 m)   Wt 47.3 kg   SpO2 96%   BMI 21.06 kg/m   Physical Exam: Physical Exam  Palliative Assessment/Data: ***    Existing Vynca/ACP Documentation: ***  Assessment & Plan:   Impression: Present on Admission:  Sepsis due to pneumonia (HCC)  ***  SUMMARY OF RECOMMENDATIONS   ***  Symptom Management:  ***  Code Status: {Palliative Code status:23503}  Prognosis: {Palliative Care Prognosis:23504}  Discharge Planning: {Palliative dispostion:23505}  Discussed with: ***  Thank you for allowing [place] to participate in the care of Alicia Schwartz PMT will continue to support holistically.  Time Total: ***  Detailed review of medical records (labs, imaging, vital signs), medically appropriate exam, discussed with treatment team, counseling and education to patient, family, & staff, documenting clinical information, medication management, coordination of care  Wynne Dust, NP Palliative Medicine Team  Team Phone # [phone] (Nights/Weekends)  03/21/2021, 8:17 AM

## 2023-05-24 NOTE — TOC Progression Note (Signed)
Transition of Care [facility]) - Progression Note    Patient Details  Name: Alicia Schwartz MRN: [id number] Date of Birth: 08/07/1930  Transition [facility] Care [facility]) CM/SW Contact  Sheikh Leverich, Olegario Messier, RN Phone Number: 05/24/2023, 2:05 PM  Clinical Narrative:  Iline Oven for facility until 05/27/23 Monday-[facility] they have a bed available today. would need d/c summary by 3p if stable.They would not be able to d/c over weekend it would be Monday for bed available.MD updated.     Expected Discharge Plan: Skilled Nursing Facility Barriers to Discharge: Continued Medical Work up  Expected Discharge Plan and Services   Discharge Planning Services: CM Consult Post Acute Care Choice: Skilled Nursing Facility Living arrangements for the past 2 months: Single Family Home                                       Social Determinants of Health (SDOH) Interventions SDOH Screenings   Food Insecurity: No Food Insecurity (05/20/2023)  Housing: Low Risk  (05/20/2023)  Transportation Needs: Patient Unable To Answer (05/20/2023)  Utilities: Patient Unable To Answer (05/20/2023)  Tobacco Use: Low Risk  (05/17/2023)    Readmission Risk Interventions     No data to display

## 2023-05-24 NOTE — Plan of Care (Signed)
  Problem: Education: Goal: Knowledge of General Education information will improve Description: Including pain rating scale, medication(s)/side effects and non-pharmacologic comfort measures Outcome: Progressing   Problem: Clinical Measurements: Goal: Ability to maintain clinical measurements within normal limits will improve Outcome: Progressing Goal: Diagnostic test results will improve Outcome: Progressing   Problem: Nutrition: Goal: Adequate nutrition will be maintained Outcome: Progressing   Problem: Pain Management: Goal: General experience of comfort will improve Outcome: Progressing   Problem: Skin Integrity: Goal: Risk for impaired skin integrity will decrease Outcome: Progressing

## 2023-05-24 NOTE — Progress Notes (Signed)
NAME:  Alicia Schwartz, MRN:  [id number], DOB:  [birth year], LOS: 7 ADMISSION DATE:  05/17/2023, CONSULTATION DATE:  05/21/23 REFERRING MD:  Rutha Bouchard, MD CHIEF COMPLAINT:  Pleural Effusion   History of Present Illness:  Alicia Schwartz is a 89 year old woman with hypertension, SVT and hearing loss who is admitted with sepsis due to streptococcus pneumoniae bacteremia and pneumonia with right pleural.   PCCM consulted for evaluation of pleural effusion. CT Chest 10/28 showed moderate right effusion including a loculated fissural component. Complete collapse of RLL.   Patient very hard of hearing, difficult to obtain history. Spoke with son, Alicia Schwartz via phone.   Pertinent  Medical History   Past Medical History:  Diagnosis Date   Fracture, subtrochanteric, right femur, closed (HCC) 02/11/2015   Hypertension    Paroxysmal supraventricular tachycardia (HCC) 02/12/2015   Significant Hospital Events: Including procedures, antibiotic start and stop dates in addition to other pertinent events   10/25 admitted 10/29 PCCM consult for right pleural effusion, unable to perform thora at bedside due to positioning and unsafe windo, effusion appeared simple 10/31 thoracentesis performed, 50mL removed, exudative effusion - neutrophil predominance  Interim History / Subjective:   No acute events overnight Feeling better today   Objective   Blood pressure (!) 158/81, pulse 100, temperature 98.5 F (36.9 C), temperature source Oral, resp. rate 19, height 4\' 11"  (1.499 m), weight 47.3 kg, SpO2 94%.        Intake/Output Summary (Last 24 hours) at 05/24/2023 0748 Last data filed at 05/24/2023 0430 Gross per 24 hour  Intake 250 ml  Output 450 ml  Net -200 ml   Filed Weights   05/17/23 1004 05/17/23 2003  Weight: 46.9 kg 47.3 kg    Examination: General: elderly woman, hard of hearing, no distress HENT: [place]/AT, moist mucous membranes Lungs: diminished right base, no wheezing Cardiovascular:  rrr, no murmurs Abdomen: soft, non-tender, non-distended Extremities: warm, no edema Neuro: alert, moving all extremities GU: n/a  Resolved Hospital Problem list     Assessment & Plan:  Sepsis due to bacteremia and pneumonia, Strep pneumoniae Acute hypoxemic respiratory failure Right Pleural Effusion  Plan: - Exudative right effusion, gram stain negative for organisms  - follow up pleural fluid cultures - Chest x-ray with improved right effusion but loculated fissural area - continue antibiotics per ID  PCCM will sign off. Please call with any further questions.  Best Practice (right click and "Reselect all SmartList Selections" daily)   Per primary team  Labs   CBC: Recent Labs  Lab 05/17/23 1303 05/18/23 0158 05/19/23 0343 05/20/23 0322 05/21/23 0327 05/22/23 0323 05/23/23 0221 05/24/23 0313  WBC 27.1*   < > 18.2* 18.6* 18.0* 18.2* 18.3* 16.1*  NEUTROABS 24.5*  --  15.4*  --   --   --   --   --   HGB 10.8*   < > 9.8* 9.2* 9.0* 9.6* 9.0* 8.5*  HCT 30.1*   < > 28.5* 27.5* 27.0* 29.4* 27.1* 25.7*  MCV 98.0   < > 100.4* 103.4* 103.1* 103.9* 103.0* 104.0*  PLT 279   < > 290 331 333 383 368 442*   < > = values in this interval not displayed.    Basic Metabolic Panel: Recent Labs  Lab 05/17/23 1303 05/18/23 0158 05/19/23 0343 05/20/23 0322 05/21/23 0327 05/22/23 0323 05/23/23 0221 05/24/23 0313  NA 133*   < > 135 136 135 134* 134* 133*  K 3.0*   < > 4.4  4.3 4.4 4.2 4.0 3.8  CL 96*   < > 104 107 106 105 105 105  CO2 25   < > 21* 24 24 23 22 22   GLUCOSE 125*   < > 135* 96 98 94 96 122*  BUN 37*   < > 31* 25* 21 18 18 16   CREATININE 1.17*   < > 1.12* 0.99 0.96 0.93 0.76 0.80  CALCIUM 8.9   < > 8.4* 8.1* 8.0* 8.0* 7.9* 7.8*  MG 2.1  --  2.1 2.2  --   --   --   --   PHOS  --   --  1.8* 4.1  --   --   --   --    < > = values in this interval not displayed.   GFR: Estimated Creatinine Clearance: 31.9 mL/min (by C-G formula based on SCr of 0.8 mg/dL). Recent  Labs  Lab 05/17/23 1304 05/18/23 0158 05/21/23 0327 05/22/23 0323 05/23/23 0221 05/24/23 0313  WBC  --    < > 18.0* 18.2* 18.3* 16.1*  LATICACIDVEN 0.9  --   --   --   --   --    < > = values in this interval not displayed.    Liver Function Tests: Recent Labs  Lab 05/17/23 1303 05/19/23 0343 05/20/23 0322 05/23/23 1819  AST 19  --   --   --   ALT 16  --   --   --   ALKPHOS 127*  --   --   --   BILITOT 1.4*  --   --   --   PROT 6.5  --   --  5.9*  ALBUMIN 2.8* 2.4* 2.2*  --    No results for input(s): "LIPASE", "AMYLASE" in the last 168 hours. No results for input(s): "AMMONIA" in the last 168 hours.  ABG No results found for: "PHART", "PCO2ART", "PO2ART", "HCO3", "TCO2", "ACIDBASEDEF", "O2SAT"   Coagulation Profile: No results for input(s): "INR", "PROTIME" in the last 168 hours.  Cardiac Enzymes: No results for input(s): "CKTOTAL", "CKMB", "CKMBINDEX", "TROPONINI" in the last 168 hours.  HbA1C: No results found for: "HGBA1C"  CBG: Recent Labs  Lab 05/19/23 1129 05/21/23 1055  GLUCAP 150* 97    Critical care time: n/a    Alicia Comas, MD [facility] Office: [phone]   See Amion for personal pager PCCM on call pager [phone] until 7pm. Please call Elink 7p-7a. [phone]

## 2023-05-25 DIAGNOSIS — A419 Sepsis, unspecified organism: Secondary | ICD-10-CM | POA: Diagnosis not present

## 2023-05-25 DIAGNOSIS — J189 Pneumonia, unspecified organism: Secondary | ICD-10-CM | POA: Diagnosis not present

## 2023-05-25 LAB — BASIC METABOLIC PANEL
Anion gap: 6 (ref 5–15)
BUN: 14 mg/dL (ref 8–23)
CO2: 22 mmol/L (ref 22–32)
Calcium: 7.9 mg/dL — ABNORMAL LOW (ref 8.9–10.3)
Chloride: 104 mmol/L (ref 98–111)
Creatinine, Ser: 0.81 mg/dL (ref 0.44–1.00)
GFR, Estimated: >60 mL/min (ref >60)
Glucose, Bld: 96 mg/dL (ref 70–99)
Potassium: 4.2 mmol/L (ref 3.5–5.1)
Sodium: 132 mmol/L — ABNORMAL LOW (ref 135–145)

## 2023-05-25 LAB — CBC
HCT: 25.8 % — ABNORMAL LOW (ref 36.0–46.0)
Hemoglobin: 8.5 g/dL — ABNORMAL LOW (ref 12.0–15.0)
MCH: 34.3 pg — ABNORMAL HIGH (ref 26.0–34.0)
MCHC: 32.9 g/dL (ref 30.0–36.0)
MCV: 104 fL — ABNORMAL HIGH (ref 80.0–100.0)
Platelets: 397 10*3/uL (ref 150–400)
RBC: 2.48 MIL/uL — ABNORMAL LOW (ref 3.87–5.11)
RDW: 13.4 % (ref 11.5–15.5)
WBC: 14.7 10*3/uL — ABNORMAL HIGH (ref 4.0–10.5)
nRBC: 0 % (ref 0.0–0.2)

## 2023-05-25 NOTE — Progress Notes (Signed)
PROGRESS NOTE Alicia Schwartz  BJY:[id number] DOB: 03-Oct-1930 DOA: 05/17/2023 PCP: Linus Galas, NP  Brief Narrative/Hospital Course: 89 year old F with PMH of paroxysmal SVT, HTN and diminished hearing brought to ED after a social worker called EMS due to generalized weakness, acute on chronic low back pain and ambulatory dysfunction.  Reportedly saturating at 88% when EMS arrived.  Other vitals were okay.  She has leukocytosis to 27 with left shift.  CXR with small right-sided pleural effusion and possible consolidation versus mass.  Blood cultures obtained.  Patient was started on IV ceftriaxone and azithromycin, and admitted for severe sepsis due to community-acquired pneumonia.   The next day, blood culture with Streptococcus pneumonia in 1 out of 2 bottles.  Azithromycin discontinued.  ID consulted, advised CT to evaluate pleural effusion and continue ceftriaxone ID following closely, CT chest 10/28 showed: Moderate right and small left pleural effusion including loculated right-sided fissural component complete collapse of the right lower lobe and partial collapse RML and left lower lobe due to pleural effusion-PCCM consulted: Recommended chest tube placement given bacteremia for clearance of infusing and reduce the risk of trapped lung. Palliative care consulted as patient is high risk for decompensation.  Unable to do thoracentesis due to positioning. Seen by speech therapy cannot rule out aspiration clinically overall tolerating p.o. 10/31>> PCCM bedside paracentesis attempted and 50 cc of straw-colored fluid was drained and sent for analysis-Gram stain no organism, LDH elevated 455 protein 3.1, total nucleated cells 194 with 86% neutrophil. Further discussion with palliative care held, patient has high mortality/morbidity and prognosis guarded- patient's son aware and understanding.ID and pulmonary has signed off, advised Augmentin x 6 weeks from 05/24/2023, ID clinic follow-up on 12/2. Plans  for discharge back to facility on 11/4 if remains stable  Subjective: Seen thsi am [place], hard of hearing no complaints Some cough on deep breath Afebrile BP stable  Labs overall stable and WBC in fact improving   Assessment and Plan: Principal Problem:   Sepsis due to pneumonia [facility]) Active Problems:   Parapneumonic effusion   Bacteremia   Severe sepsis from Streptococcus pneumoniae CAP Loculated right-sided/Moderate right and small left pleural effusion: Blood culture with Streptococcus pneumoniae-source felt to be CAP, CT scan shows component of loculation, PCCM consulted for chest tube consideration, thoracentesis attempted but no safe window due to repositioning. Echo without gross abnormalities.  10/31>> PCCM bedside paracentesis attempted and 50 cc of straw-colored fluid was drained and sent for analysis-Gram stain no organism, LDH elevated 455 protein 3.1, total nucleated cells 194 with 86% neutrophil. Further discussion with palliative care held, patient has high mortality/morbidity and prognosis guarded- patient's son aware and understanding.ID and pulmonary has signed off, advised Augmentin x 6 weeks from 05/24/2023, ID clinic follow-up on 12/2 Pulmonary has seen the patient can consider CT chest tube later if needed son did not want chest tube right away. Recent Labs  Lab 05/21/23 0327 05/22/23 0323 05/23/23 0221 05/24/23 0313 05/25/23 0420  WBC 18.0* 18.2* 18.3* 16.1* 14.7*    Acute hypoxic respiratory failure due to pneumonia: Due to CAP, and pleural effusion. Cont Helper and wean.  AKI on CKD 3B: AKI resolved.  Baseline creatinine 1.2-1.3, on losartan.Off IV fluids.  New onset A-fib: Likely from sepsis, TSH normal, now started on Toprol not on anticoagulation due to fall risk after discussing risk benefits with patient's sons by previous provider, follow-up echocardiogram is stable.  Hypokalemia: Resolved  Anemia of renal disease: Hemoglobin being monitored  transfuse for less  than 7 g continue iron supplement.  Ambulatory dysfunction and generalized weakness deconditioning: PT OT evaluation TOC consult, social worker concerned about patient being home alone  Goals of care: DNR, severely diminished hearing.  Palliative care has also been consulted.  DVT prophylaxis: heparin injection 5,000 Units Start: 05/18/23 2200 SCDs Start: 05/17/23 1411 Code Status:   Code Status: Do not attempt resuscitation (DNR) PRE-ARREST INTERVENTIONS DESIRED Family Communication: plan of care discussed with patient at bedside. Patient status is: Inpatient because of pneumonia Level of care: Telemetry   Dispo: The patient is from: home            Anticipated disposition: SNF 11/4 if stable  Objective: Vitals last 24 hrs: Vitals:   05/24/23 1218 05/24/23 2005 05/25/23 0459 05/25/23 0859  BP: (!) 141/79 138/63 (!) 145/74 (!) 140/73  Pulse: 78 89 88 93  Resp: (!) 24 16 18    Temp: (!) 97.5 F (36.4 C) (!) 97.4 F (36.3 C) 97.6 F (36.4 C)   TempSrc: Oral Oral Oral   SpO2: 96% 98% 98%   Weight:      Height:       Weight change:   Physical Examination: General exam: alert awake, oriented to self/place HEENT:Oral mucosa moist, Ear/Nose WNL grossly Respiratory system: Bilaterally mild wheezing Cardiovascular system: S1 & S2 +, No JVD. Gastrointestinal system: Abdomen soft,NT,ND, BS+ Nervous System: Alert, awake, moving all extremities,and following commands. Extremities: LE edema neg,distal peripheral pulses palpable and warm.  Skin: No rashes,no icterus. MSK: Normal muscle bulk,tone, power   Medications reviewed:  Scheduled Meds:  alteplase  2 mg Intracatheter Once   amoxicillin-clavulanate  1 tablet Oral Q12H   Chlorhexidine Gluconate Cloth  6 each Topical Daily   ferrous sulfate  325 mg Oral Q breakfast   heparin injection (subcutaneous)  5,000 Units Subcutaneous Q8H   LORazepam  0.5 mg Oral QHS   metoprolol succinate  25 mg Oral Daily    multivitamin with minerals  1 tablet Oral Daily   mouth rinse  15 mL Mouth Rinse 4 times per day   sertraline  25 mg Oral Daily   sodium chloride flush  10-40 mL Intracatheter Q12H   Continuous Infusions:  Diet Order             Diet regular Room service appropriate? Yes; Fluid consistency: Thin  Diet effective now                  Intake/Output Summary (Last 24 hours) at 05/25/2023 1009 Last data filed at 05/25/2023 0645 Gross per 24 hour  Intake 240 ml  Output 450 ml  Net -210 ml   Net IO Since Admission: 776.15 mL [05/25/23 1009]  Wt Readings from Last 3 Encounters:  05/17/23 47.3 kg  02/24/22 46.9 kg  01/26/22 47.6 kg     Unresulted Labs (From admission, onward)     Start     Ordered   05/23/23 1501  Pathologist smear review  Once,   R        05/23/23 1501          Data Reviewed: I have personally reviewed following labs and imaging studies CBC: Recent Labs  Lab 05/19/23 0343 05/20/23 0322 05/21/23 0327 05/22/23 0323 05/23/23 0221 05/24/23 0313 05/25/23 0420  WBC 18.2*   < > 18.0* 18.2* 18.3* 16.1* 14.7*  NEUTROABS 15.4*  --   --   --   --   --   --   HGB 9.8*   < >  9.0* 9.6* 9.0* 8.5* 8.5*  HCT 28.5*   < > 27.0* 29.4* 27.1* 25.7* 25.8*  MCV 100.4*   < > 103.1* 103.9* 103.0* 104.0* 104.0*  PLT 290   < > 333 383 368 442* 397   < > = values in this interval not displayed.   Basic Metabolic Panel: Recent Labs  Lab 05/19/23 0343 05/20/23 0322 05/21/23 0327 05/22/23 0323 05/23/23 0221 05/24/23 0313 05/25/23 0420  NA 135 136 135 134* 134* 133* 132*  K 4.4 4.3 4.4 4.2 4.0 3.8 4.2  CL 104 107 106 105 105 105 104  CO2 21* 24 24 23 22 22 22   GLUCOSE 135* 96 98 94 96 122* 96  BUN 31* 25* 21 18 18 16 14   CREATININE 1.12* 0.99 0.96 0.93 0.76 0.80 0.81  CALCIUM 8.4* 8.1* 8.0* 8.0* 7.9* 7.8* 7.9*  MG 2.1 2.2  --   --   --   --   --   PHOS 1.8* 4.1  --   --   --   --   --    GFR: Estimated Creatinine Clearance: 31.5 mL/min (by C-G formula based on  SCr of 0.81 mg/dL). Liver Function Tests: Recent Labs  Lab 05/19/23 0343 05/20/23 0322 05/23/23 1819  PROT  --   --  5.9*  ALBUMIN 2.4* 2.2*  --   CBG: Recent Labs  Lab 05/19/23 1129 05/21/23 1055  GLUCAP 150* 97   Lipid Profile: No results for input(s): "CHOL", "HDL", "LDLCALC", "TRIG", "CHOLHDL", "LDLDIRECT" in the last 72 hours. Thyroid Function Tests: No results for input(s): "TSH", "T4TOTAL", "FREET4", "T3FREE", "THYROIDAB" in the last 72 hours.  Sepsis Labs: No results for input(s): "PROCALCITON", "LATICACIDVEN" in the last 168 hours.   Recent Results (from the past 240 hour(s))  Resp panel by RT-PCR (RSV, Flu A&B, Covid) Anterior Nasal Swab     Status: None   Collection Time: 05/17/23 10:40 AM   Specimen: Anterior Nasal Swab  Result Value Ref Range Status   SARS Coronavirus 2 by RT PCR NEGATIVE NEGATIVE Final    Comment: (NOTE) SARS-CoV-2 target nucleic acids are NOT DETECTED.  The SARS-CoV-2 RNA is generally detectable in upper respiratory specimens during the acute phase of infection. The lowest concentration of SARS-CoV-2 viral copies this assay can detect is 138 copies/mL. A negative result does not preclude SARS-Cov-2 infection and should not be used as the sole basis for treatment or other patient management decisions. A negative result may occur with  improper specimen collection/handling, submission of specimen other than nasopharyngeal swab, presence of viral mutation(s) within the areas targeted by this assay, and inadequate number of viral copies(<138 copies/mL). A negative result must be combined with clinical observations, patient history, and epidemiological information. The expected result is Negative.  Fact Sheet for Patients:  BloggerCourse.com  Fact Sheet for Healthcare Providers:  SeriousBroker.it  This test is no t yet approved or cleared by the [place] FDA and  has been authorized  for detection and/or diagnosis of SARS-CoV-2 by FDA under an Emergency Use Authorization (EUA). This EUA will remain  in effect (meaning this test can be used) for the duration of the COVID-19 declaration under Section 564(b)(1) of the Act, 21 U.S.C.section 360bbb-3(b)(1), unless the authorization is terminated  or revoked sooner.       Influenza A by PCR NEGATIVE NEGATIVE Final   Influenza B by PCR NEGATIVE NEGATIVE Final    Comment: (NOTE) The Xpert Xpress SARS-CoV-2/FLU/RSV plus assay is intended as an aid  in the diagnosis of influenza from Nasopharyngeal swab specimens and should not be used as a sole basis for treatment. Nasal washings and aspirates are unacceptable for Xpert Xpress SARS-CoV-2/FLU/RSV testing.  Fact Sheet for Patients: BloggerCourse.com  Fact Sheet for Healthcare Providers: SeriousBroker.it  This test is not yet approved or cleared by the [place] FDA and has been authorized for detection and/or diagnosis of SARS-CoV-2 by FDA under an Emergency Use Authorization (EUA). This EUA will remain in effect (meaning this test can be used) for the duration of the COVID-19 declaration under Section 564(b)(1) of the Act, 21 U.S.C. section 360bbb-3(b)(1), unless the authorization is terminated or revoked.     Resp Syncytial Virus by PCR NEGATIVE NEGATIVE Final    Comment: (NOTE) Fact Sheet for Patients: BloggerCourse.com  Fact Sheet for Healthcare Providers: SeriousBroker.it  This test is not yet approved or cleared by the [place] FDA and has been authorized for detection and/or diagnosis of SARS-CoV-2 by FDA under an Emergency Use Authorization (EUA). This EUA will remain in effect (meaning this test can be used) for the duration of the COVID-19 declaration under Section 564(b)(1) of the Act, 21 U.S.C. section 360bbb-3(b)(1), unless the authorization is  terminated or revoked.  Performed at [facility], [address], [place], [place] [address]   Blood Culture (routine x 2)     Status: Abnormal   Collection Time: 05/17/23 12:57 PM   Specimen: Neck; Blood  Result Value Ref Range Status   Specimen Description   Final    NECK LEFT Performed at [facility], [address], [place], [place] [address]    Special Requests   Final    BOTTLES DRAWN AEROBIC AND ANAEROBIC Blood Culture adequate volume Performed at [facility], [address], [place], [place] [address]    Culture  Setup Time   Final    GRAM POSITIVE COCCI IN BOTH AEROBIC AND ANAEROBIC BOTTLES CRITICAL RESULT CALLED TO, READ BACK BY AND VERIFIED WITH: L POINDEXTER,PHARMD@0520  05/18/23 MK Performed at [facility], [address] [place], [place] [address]    Culture STREPTOCOCCUS PNEUMONIAE (A)  Final   Report Status 05/20/2023 FINAL  Final   Organism ID, Bacteria STREPTOCOCCUS PNEUMONIAE  Final      Susceptibility   Streptococcus pneumoniae - MIC*    ERYTHROMYCIN >=8 RESISTANT Resistant     LEVOFLOXACIN 0.5 SENSITIVE Sensitive     VANCOMYCIN 0.5 SENSITIVE Sensitive     PENICILLIN (meningitis) 0.5 RESISTANT Resistant     PENO - penicillin 0.5      PENICILLIN (non-meningitis) 0.5 SENSITIVE Sensitive     PENICILLIN (oral) 0.5 INTERMEDIATE Intermediate     CEFTRIAXONE (non-meningitis) 1 SENSITIVE Sensitive     CEFTRIAXONE (meningitis) 1 INTERMEDIATE Intermediate     * STREPTOCOCCUS PNEUMONIAE  Blood Culture ID Panel (Reflexed)     Status: Abnormal   Collection Time: 05/17/23 12:57 PM  Result Value Ref Range Status   Enterococcus faecalis NOT DETECTED NOT DETECTED Final   Enterococcus Faecium NOT DETECTED NOT DETECTED Final   Listeria monocytogenes NOT DETECTED NOT DETECTED Final   Staphylococcus species NOT DETECTED NOT DETECTED Final   Staphylococcus aureus (BCID) NOT DETECTED NOT DETECTED Final    Staphylococcus epidermidis NOT DETECTED NOT DETECTED Final   Staphylococcus lugdunensis NOT DETECTED NOT DETECTED Final   Streptococcus species DETECTED (A) NOT DETECTED Final    Comment: CRITICAL RESULT CALLED TO, READ BACK BY AND VERIFIED WITH: LPOINDEXTER,PHARMD@0520  05/18/23 MK  Streptococcus agalactiae NOT DETECTED NOT DETECTED Final   Streptococcus pneumoniae DETECTED (A) NOT DETECTED Final    Comment: CRITICAL RESULT CALLED TO, READ BACK BY AND VERIFIED WITH: L POINDEXTER,PHARMD@0520  05/18/23 MK    Streptococcus pyogenes NOT DETECTED NOT DETECTED Final   A.calcoaceticus-baumannii NOT DETECTED NOT DETECTED Final   Bacteroides fragilis NOT DETECTED NOT DETECTED Final   Enterobacterales NOT DETECTED NOT DETECTED Final   Enterobacter cloacae complex NOT DETECTED NOT DETECTED Final   Escherichia coli NOT DETECTED NOT DETECTED Final   Klebsiella aerogenes NOT DETECTED NOT DETECTED Final   Klebsiella oxytoca NOT DETECTED NOT DETECTED Final   Klebsiella pneumoniae NOT DETECTED NOT DETECTED Final   Proteus species NOT DETECTED NOT DETECTED Final   Salmonella species NOT DETECTED NOT DETECTED Final   Serratia marcescens NOT DETECTED NOT DETECTED Final   Haemophilus influenzae NOT DETECTED NOT DETECTED Final   Neisseria meningitidis NOT DETECTED NOT DETECTED Final   Pseudomonas aeruginosa NOT DETECTED NOT DETECTED Final   Stenotrophomonas maltophilia NOT DETECTED NOT DETECTED Final   Candida albicans NOT DETECTED NOT DETECTED Final   Candida auris NOT DETECTED NOT DETECTED Final   Candida glabrata NOT DETECTED NOT DETECTED Final   Candida krusei NOT DETECTED NOT DETECTED Final   Candida parapsilosis NOT DETECTED NOT DETECTED Final   Candida tropicalis NOT DETECTED NOT DETECTED Final   Cryptococcus neoformans/gattii NOT DETECTED NOT DETECTED Final    Comment: Performed at [facility], [address] [place], [place] [address]  Blood Culture (routine x 2)     Status: None    Collection Time: 05/17/23  8:49 PM   Specimen: BLOOD  Result Value Ref Range Status   Specimen Description   Final    BLOOD LEFT ANTECUBITAL Performed at [facility], [address] [place], [place] [address]    Special Requests   Final    BOTTLES DRAWN AEROBIC AND ANAEROBIC Blood Culture adequate volume Performed at [facility], [address], [place], [place] [address]    Culture   Final    NO GROWTH 5 DAYS Performed at [facility], [address] [place], [place] [address]    Report Status 05/22/2023 FINAL  Final  Pleural fluid culture w Gram Stain     Status: None (Preliminary result)   Collection Time: 05/23/23  3:01 PM   Specimen: Pleural Fluid  Result Value Ref Range Status   Specimen Description   Final    PLEURAL Performed at [facility], [address], [place], [place] [address]    Special Requests   Final    NONE Performed at [facility], [address] [place], [place] [address]    Gram Stain NO WBC SEEN NO ORGANISMS SEEN CYTOSPIN SMEAR   Final   Culture   Final    NO GROWTH < 24 HOURS Performed at [facility], [address], [place], [place] [address]    Report Status PENDING  Incomplete    Antimicrobials: Anti-infectives (From admission, onward)    Start     Dose/Rate Route Frequency Ordered Stop   05/24/23 1000  amoxicillin-clavulanate (AUGMENTIN) 875-125 MG per tablet 1 tablet        1 tablet Oral Every 12 hours 05/24/23 0828     05/21/23 1000  amoxicillin-clavulanate (AUGMENTIN) 500-125 MG per tablet 1 tablet  Status:  Discontinued        1 tablet Oral 2 times daily 05/20/23  1028 05/24/23 0828   05/18/23 1500  cefTRIAXone (ROCEPHIN) 1 g in sodium chloride 0.9 % 100 mL IVPB  Status:  Discontinued        1 g 200 mL/hr over 30 Minutes Intravenous Every 24 hours 05/17/23 1412 05/18/23 0542   05/18/23 1200  azithromycin (ZITHROMAX) 500 mg in sodium chloride 0.9 % 250 mL  IVPB  Status:  Discontinued        500 mg 250 mL/hr over 60 Minutes Intravenous Every 24 hours 05/17/23 1412 05/18/23 1101   05/18/23 1000  cefTRIAXone (ROCEPHIN) 2 g in sodium chloride 0.9 % 100 mL IVPB  Status:  Discontinued        2 g 200 mL/hr over 30 Minutes Intravenous Every 24 hours 05/18/23 0544 05/20/23 1028   05/17/23 1400  cefTRIAXone (ROCEPHIN) injection 1 g        1 g Intramuscular  Once 05/17/23 1346 05/17/23 1450   05/17/23 1300  cefTRIAXone (ROCEPHIN) 1 g in sodium chloride 0.9 % 100 mL IVPB  Status:  Discontinued        1 g 200 mL/hr over 30 Minutes Intravenous  Once 05/17/23 1257 05/17/23 2011   05/17/23 1300  azithromycin (ZITHROMAX) tablet 500 mg        500 mg Oral  Once 05/17/23 1257 05/17/23 1327      Culture/Microbiology    Component Value Date/Time   SDES  05/23/2023 1501    PLEURAL Performed at [facility], [address] [place], [place] [address]    SPECREQUEST  05/23/2023 1501    NONE Performed at [facility], [address], [place], [place] [address]    CULT  05/23/2023 1501    NO GROWTH < 24 HOURS Performed at [facility], [address], [place], [place] [address]    REPTSTATUS PENDING 05/23/2023 1501    Other culture-see note  Radiology Studies: DG CHEST PORT 1 VIEW  Result Date: 05/23/2023 CLINICAL DATA:  Status post thoracentesis. EXAM: PORTABLE CHEST 1 VIEW COMPARISON:  May 17, 2023. FINDINGS: No pneumothorax is noted. Stable loculated pleural effusion is noted in right lung base which is slightly decreased in size. IMPRESSION: No pneumothorax status post right thoracentesis. Electronically Signed   By: Lupita Raider M.D.   On: 05/23/2023 17:17     LOS: 8 days   Lanae Boast, MD [facility]  05/25/2023, 10:09 AM

## 2023-05-25 NOTE — Plan of Care (Signed)
  Problem: Coping: Goal: Level of anxiety will decrease Outcome: Progressing   Problem: Elimination: Goal: Will not experience complications related to bowel motility Outcome: Progressing Goal: Will not experience complications related to urinary retention Outcome: Progressing   Problem: Pain Management: Goal: General experience of comfort will improve Outcome: Progressing   Problem: Safety: Goal: Ability to remain free from injury will improve Outcome: Progressing   Problem: Skin Integrity: Goal: Risk for impaired skin integrity will decrease Outcome: Progressing   Problem: Clinical Measurements: Goal: Ability to maintain a body temperature in the normal range will improve Outcome: Progressing   Problem: Respiratory: Goal: Ability to maintain a clear airway will improve Outcome: Progressing

## 2023-05-25 NOTE — Plan of Care (Signed)
?  Problem: Clinical Measurements: ?Goal: Respiratory complications will improve ?Outcome: Progressing ?Goal: Cardiovascular complication will be avoided ?Outcome: Progressing ?  ?Problem: Safety: ?Goal: Ability to remain free from injury will improve ?Outcome: Progressing ?  ?

## 2023-05-25 NOTE — Progress Notes (Signed)
PT Cancellation Note  Patient Details Name: Alicia Schwartz MRN: [id number] DOB: 03-22-1931   Cancelled Treatment:    Reason Eval/Treat Not Completed: Patient declined, patient reports not feeling well, offered to dangle on bed edge but not able.  Blanchard Kelch PT Acute Rehabilitation Services Office [phone] Weekend pager-[phone]   Rada Hay 05/25/2023, 12:06 PM

## 2023-05-26 DIAGNOSIS — N1832 Chronic kidney disease, stage 3b: Secondary | ICD-10-CM | POA: Insufficient documentation

## 2023-05-26 DIAGNOSIS — J189 Pneumonia, unspecified organism: Secondary | ICD-10-CM | POA: Diagnosis not present

## 2023-05-26 DIAGNOSIS — A419 Sepsis, unspecified organism: Secondary | ICD-10-CM | POA: Diagnosis not present

## 2023-05-26 DIAGNOSIS — I480 Paroxysmal atrial fibrillation: Secondary | ICD-10-CM | POA: Insufficient documentation

## 2023-05-26 MED ORDER — ZINC OXIDE 40 % EX OINT
TOPICAL_OINTMENT | CUTANEOUS | Status: DC | PRN
  Filled 2023-05-26: qty 57

## 2023-05-26 NOTE — Plan of Care (Signed)
  Problem: Coping: Goal: Level of anxiety will decrease Outcome: Progressing   Problem: Elimination: Goal: Will not experience complications related to bowel motility Outcome: Progressing Goal: Will not experience complications related to urinary retention Outcome: Progressing   Problem: Pain Management: Goal: General experience of comfort will improve Outcome: Progressing   Problem: Safety: Goal: Ability to remain free from injury will improve Outcome: Progressing   Problem: Skin Integrity: Goal: Risk for impaired skin integrity will decrease Outcome: Progressing   Problem: Clinical Measurements: Goal: Ability to maintain a body temperature in the normal range will improve Outcome: Progressing   Problem: Respiratory: Goal: Ability to maintain a clear airway will improve Outcome: Progressing

## 2023-05-26 NOTE — Progress Notes (Signed)
PROGRESS NOTE    Alicia Schwartz  ZOX:[id number] DOB: [birth year] DOA: 05/17/2023 PCP: Linus Galas, NP     Brief Narrative:  Alicia Schwartz is a 89 year old F with PMH of paroxysmal SVT, HTN and diminished hearing who presented to the hospital 05/17/2023 due to generalized weakness, ambulatory dysfunction, acute on chronic low back pain.  She was found to have leukocytosis, right-sided pleural effusion.  She was started on IV Rocephin, azithromycin and admitted for severe sepsis secondary to community-acquired pneumonia.  Blood culture showed Streptococcus pneumonia.  Infectious disease consulted.  CT chest revealed moderate right and small left pleural effusion, loculated right-sided fissure component complete collapse of right lower lobe, partial collapse right middle lobe and left lower lobe due to pleural effusion.  PCCM consulted and patient underwent thoracentesis on 10/31. 50 cc of straw-colored fluid was drained and sent for analysis-Gram stain no organism, LDH elevated 455 protein 3.1, total nucleated cells 194 with 86% neutrophil.   New events last 24 hours / Subjective: Patient very hard of hearing.  No new complaints today.  Assessment & Plan:   Principal Problem:   Sepsis due to pneumonia [facility]) Active Problems:   AKI (acute kidney injury) (HCC)   Physical deconditioning   Parapneumonic effusion   Bacteremia   CKD stage 3b, GFR 30-44 ml/min (HCC)   Paroxysmal atrial fibrillation (HCC)   Severe sepsis secondary to Streptococcus pneumonia, strep bacteremia, loculated pleural effusion -Status post thoracentesis 10/31 -Recommend Augmentin x 6 weeks.  Follow-up with infectious disease on 12/2 -PCCM and infectious disease signed off -WBC improving  AKI on CKD stage IIIb -Baseline creatinine 1.2-1.3  New onset A-fib -Likely secondary to sepsis -Toprol -Not on anticoagulation due to fall risk  Generalized weakness, deconditioning, ambulatory dysfunction -SNF  placement pending     DVT prophylaxis:  heparin injection 5,000 Units Start: 05/18/23 2200 SCDs Start: 05/17/23 1411  Code Status: DNR, no CPR Family Communication: None at bedside Disposition Plan: SNF Status is: Inpatient Remains inpatient appropriate because: SNF placement pending, should have bed on Monday 11/4    Antimicrobials:  Anti-infectives (From admission, onward)    Start     Dose/Rate Route Frequency Ordered Stop   05/24/23 1000  amoxicillin-clavulanate (AUGMENTIN) 875-125 MG per tablet 1 tablet        1 tablet Oral Every 12 hours 05/24/23 0828     05/21/23 1000  amoxicillin-clavulanate (AUGMENTIN) 500-125 MG per tablet 1 tablet  Status:  Discontinued        1 tablet Oral 2 times daily 05/20/23 1028 05/24/23 0828   05/18/23 1500  cefTRIAXone (ROCEPHIN) 1 g in sodium chloride 0.9 % 100 mL IVPB  Status:  Discontinued        1 g 200 mL/hr over 30 Minutes Intravenous Every 24 hours 05/17/23 1412 05/18/23 0542   05/18/23 1200  azithromycin (ZITHROMAX) 500 mg in sodium chloride 0.9 % 250 mL IVPB  Status:  Discontinued        500 mg 250 mL/hr over 60 Minutes Intravenous Every 24 hours 05/17/23 1412 05/18/23 1101   05/18/23 1000  cefTRIAXone (ROCEPHIN) 2 g in sodium chloride 0.9 % 100 mL IVPB  Status:  Discontinued        2 g 200 mL/hr over 30 Minutes Intravenous Every 24 hours 05/18/23 0544 05/20/23 1028   05/17/23 1400  cefTRIAXone (ROCEPHIN) injection 1 g        1 g Intramuscular  Once 05/17/23 1346 05/17/23 1450   05/17/23 1300  cefTRIAXone (ROCEPHIN) 1 g in sodium chloride 0.9 % 100 mL IVPB  Status:  Discontinued        1 g 200 mL/hr over 30 Minutes Intravenous  Once 05/17/23 1257 05/17/23 2011   05/17/23 1300  azithromycin (ZITHROMAX) tablet 500 mg        500 mg Oral  Once 05/17/23 1257 05/17/23 1327        Objective: Vitals:   05/25/23 1936 05/26/23 0527 05/26/23 0942 05/26/23 1323  BP:  (!) 143/81 (!) 152/79 (!) 149/64  Pulse: 86 73 90 66  Resp:  16     Temp:  97.9 F (36.6 C)  (!) 97.4 F (36.3 C)  TempSrc:  Oral  Oral  SpO2: 98% 99%  99%  Weight:      Height:        Intake/Output Summary (Last 24 hours) at 05/26/2023 1415 Last data filed at 05/26/2023 1219 Gross per 24 hour  Intake 510 ml  Output 400 ml  Net 110 ml   Filed Weights   05/17/23 1004 05/17/23 2003  Weight: 46.9 kg 47.3 kg    Examination:  General exam: Appears calm and comfortable, very hard of hearing Respiratory system: Clear to auscultation anteriorly. Respiratory effort normal. No respiratory distress. No conversational dyspnea.  Cardiovascular system: S1 & S2 heard, RRR. No murmurs. No pedal edema. Gastrointestinal system: Abdomen is nondistended, soft and nontender. Normal bowel sounds heard. Central nervous system: Alert   Data Reviewed: I have personally reviewed following labs and imaging studies  CBC: Recent Labs  Lab 05/21/23 0327 05/22/23 0323 05/23/23 0221 05/24/23 0313 05/25/23 0420  WBC 18.0* 18.2* 18.3* 16.1* 14.7*  HGB 9.0* 9.6* 9.0* 8.5* 8.5*  HCT 27.0* 29.4* 27.1* 25.7* 25.8*  MCV 103.1* 103.9* 103.0* 104.0* 104.0*  PLT 333 383 368 442* 397   Basic Metabolic Panel: Recent Labs  Lab 05/20/23 0322 05/21/23 0327 05/22/23 0323 05/23/23 0221 05/24/23 0313 05/25/23 0420  NA 136 135 134* 134* 133* 132*  K 4.3 4.4 4.2 4.0 3.8 4.2  CL 107 106 105 105 105 104  CO2 24 24 23 22 22 22   GLUCOSE 96 98 94 96 122* 96  BUN 25* 21 18 18 16 14   CREATININE 0.99 0.96 0.93 0.76 0.80 0.81  CALCIUM 8.1* 8.0* 8.0* 7.9* 7.8* 7.9*  MG 2.2  --   --   --   --   --   PHOS 4.1  --   --   --   --   --    GFR: Estimated Creatinine Clearance: 31.5 mL/min (by C-G formula based on SCr of 0.81 mg/dL). Liver Function Tests: Recent Labs  Lab 05/20/23 0322 05/23/23 1819  PROT  --  5.9*  ALBUMIN 2.2*  --    No results for input(s): "LIPASE", "AMYLASE" in the last 168 hours. No results for input(s): "AMMONIA" in the last 168 hours. Coagulation  Profile: No results for input(s): "INR", "PROTIME" in the last 168 hours. Cardiac Enzymes: No results for input(s): "CKTOTAL", "CKMB", "CKMBINDEX", "TROPONINI" in the last 168 hours. BNP (last 3 results) No results for input(s): "PROBNP" in the last 8760 hours. HbA1C: No results for input(s): "HGBA1C" in the last 72 hours. CBG: Recent Labs  Lab 05/21/23 1055  GLUCAP 97   Lipid Profile: No results for input(s): "CHOL", "HDL", "LDLCALC", "TRIG", "CHOLHDL", "LDLDIRECT" in the last 72 hours. Thyroid Function Tests: No results for input(s): "TSH", "T4TOTAL", "FREET4", "T3FREE", "THYROIDAB" in the last 72 hours. Anemia Panel: No  results for input(s): "VITAMINB12", "FOLATE", "FERRITIN", "TIBC", "IRON", "RETICCTPCT" in the last 72 hours. Sepsis Labs: No results for input(s): "PROCALCITON", "LATICACIDVEN" in the last 168 hours.  Recent Results (from the past 240 hour(s))  Resp panel by RT-PCR (RSV, Flu A&B, Covid) Anterior Nasal Swab     Status: None   Collection Time: 05/17/23 10:40 AM   Specimen: Anterior Nasal Swab  Result Value Ref Range Status   SARS Coronavirus 2 by RT PCR NEGATIVE NEGATIVE Final    Comment: (NOTE) SARS-CoV-2 target nucleic acids are NOT DETECTED.  The SARS-CoV-2 RNA is generally detectable in upper respiratory specimens during the acute phase of infection. The lowest concentration of SARS-CoV-2 viral copies this assay can detect is 138 copies/mL. A negative result does not preclude SARS-Cov-2 infection and should not be used as the sole basis for treatment or other patient management decisions. A negative result may occur with  improper specimen collection/handling, submission of specimen other than nasopharyngeal swab, presence of viral mutation(s) within the areas targeted by this assay, and inadequate number of viral copies(<138 copies/mL). A negative result must be combined with clinical observations, patient history, and epidemiological information. The  expected result is Negative.  Fact Sheet for Patients:  BloggerCourse.com  Fact Sheet for Healthcare Providers:  SeriousBroker.it  This test is no t yet approved or cleared by the [place] FDA and  has been authorized for detection and/or diagnosis of SARS-CoV-2 by FDA under an Emergency Use Authorization (EUA). This EUA will remain  in effect (meaning this test can be used) for the duration of the COVID-19 declaration under Section 564(b)(1) of the Act, 21 U.S.C.section 360bbb-3(b)(1), unless the authorization is terminated  or revoked sooner.       Influenza A by PCR NEGATIVE NEGATIVE Final   Influenza B by PCR NEGATIVE NEGATIVE Final    Comment: (NOTE) The Xpert Xpress SARS-CoV-2/FLU/RSV plus assay is intended as an aid in the diagnosis of influenza from Nasopharyngeal swab specimens and should not be used as a sole basis for treatment. Nasal washings and aspirates are unacceptable for Xpert Xpress SARS-CoV-2/FLU/RSV testing.  Fact Sheet for Patients: BloggerCourse.com  Fact Sheet for Healthcare Providers: SeriousBroker.it  This test is not yet approved or cleared by the [place] FDA and has been authorized for detection and/or diagnosis of SARS-CoV-2 by FDA under an Emergency Use Authorization (EUA). This EUA will remain in effect (meaning this test can be used) for the duration of the COVID-19 declaration under Section 564(b)(1) of the Act, 21 U.S.C. section 360bbb-3(b)(1), unless the authorization is terminated or revoked.     Resp Syncytial Virus by PCR NEGATIVE NEGATIVE Final    Comment: (NOTE) Fact Sheet for Patients: BloggerCourse.com  Fact Sheet for Healthcare Providers: SeriousBroker.it  This test is not yet approved or cleared by the [place] FDA and has been authorized for detection and/or  diagnosis of SARS-CoV-2 by FDA under an Emergency Use Authorization (EUA). This EUA will remain in effect (meaning this test can be used) for the duration of the COVID-19 declaration under Section 564(b)(1) of the Act, 21 U.S.C. section 360bbb-3(b)(1), unless the authorization is terminated or revoked.  Performed at [facility], [address] [place], [place] [address]   Blood Culture (routine x 2)     Status: Abnormal   Collection Time: 05/17/23 12:57 PM   Specimen: Neck; Blood  Result Value Ref Range Status   Specimen Description   Final    NECK LEFT Performed at [facility] [place] [facility], [address], [place], [place] [address]    Special Requests   Final    BOTTLES DRAWN AEROBIC AND ANAEROBIC Blood Culture adequate volume Performed at [facility], [address], [place], [place] [address]    Culture  Setup Time   Final    GRAM POSITIVE COCCI IN BOTH AEROBIC AND ANAEROBIC BOTTLES CRITICAL RESULT CALLED TO, READ BACK BY AND VERIFIED WITH: L POINDEXTER,PHARMD@0520  05/18/23 MK Performed at [facility], [address] [place], [place] [address]    Culture STREPTOCOCCUS PNEUMONIAE (A)  Final   Report Status 05/20/2023 FINAL  Final   Organism ID, Bacteria STREPTOCOCCUS PNEUMONIAE  Final      Susceptibility   Streptococcus pneumoniae - MIC*    ERYTHROMYCIN >=8 RESISTANT Resistant     LEVOFLOXACIN 0.5 SENSITIVE Sensitive     VANCOMYCIN 0.5 SENSITIVE Sensitive     PENICILLIN (meningitis) 0.5 RESISTANT Resistant     PENO - penicillin 0.5      PENICILLIN (non-meningitis) 0.5 SENSITIVE Sensitive     PENICILLIN (oral) 0.5 INTERMEDIATE Intermediate     CEFTRIAXONE (non-meningitis) 1 SENSITIVE Sensitive     CEFTRIAXONE (meningitis) 1 INTERMEDIATE Intermediate     * STREPTOCOCCUS PNEUMONIAE  Blood Culture ID Panel (Reflexed)     Status: Abnormal   Collection Time: 05/17/23 12:57 PM  Result Value Ref Range Status    Enterococcus faecalis NOT DETECTED NOT DETECTED Final   Enterococcus Faecium NOT DETECTED NOT DETECTED Final   Listeria monocytogenes NOT DETECTED NOT DETECTED Final   Staphylococcus species NOT DETECTED NOT DETECTED Final   Staphylococcus aureus (BCID) NOT DETECTED NOT DETECTED Final   Staphylococcus epidermidis NOT DETECTED NOT DETECTED Final   Staphylococcus lugdunensis NOT DETECTED NOT DETECTED Final   Streptococcus species DETECTED (A) NOT DETECTED Final    Comment: CRITICAL RESULT CALLED TO, READ BACK BY AND VERIFIED WITH: LPOINDEXTER,PHARMD@0520  05/18/23 MK    Streptococcus agalactiae NOT DETECTED NOT DETECTED Final   Streptococcus pneumoniae DETECTED (A) NOT DETECTED Final    Comment: CRITICAL RESULT CALLED TO, READ BACK BY AND VERIFIED WITH: L POINDEXTER,PHARMD@0520  05/18/23 MK    Streptococcus pyogenes NOT DETECTED NOT DETECTED Final   A.calcoaceticus-baumannii NOT DETECTED NOT DETECTED Final   Bacteroides fragilis NOT DETECTED NOT DETECTED Final   Enterobacterales NOT DETECTED NOT DETECTED Final   Enterobacter cloacae complex NOT DETECTED NOT DETECTED Final   Escherichia coli NOT DETECTED NOT DETECTED Final   Klebsiella aerogenes NOT DETECTED NOT DETECTED Final   Klebsiella oxytoca NOT DETECTED NOT DETECTED Final   Klebsiella pneumoniae NOT DETECTED NOT DETECTED Final   Proteus species NOT DETECTED NOT DETECTED Final   Salmonella species NOT DETECTED NOT DETECTED Final   Serratia marcescens NOT DETECTED NOT DETECTED Final   Haemophilus influenzae NOT DETECTED NOT DETECTED Final   Neisseria meningitidis NOT DETECTED NOT DETECTED Final   Pseudomonas aeruginosa NOT DETECTED NOT DETECTED Final   Stenotrophomonas maltophilia NOT DETECTED NOT DETECTED Final   Candida albicans NOT DETECTED NOT DETECTED Final   Candida auris NOT DETECTED NOT DETECTED Final   Candida glabrata NOT DETECTED NOT DETECTED Final   Candida krusei NOT DETECTED NOT DETECTED Final   Candida parapsilosis  NOT DETECTED NOT DETECTED Final   Candida tropicalis NOT DETECTED NOT DETECTED Final   Cryptococcus neoformans/gattii NOT DETECTED NOT DETECTED Final    Comment: Performed at [facility], [address] [place], [place] [address]  Blood Culture (routine x 2)  Status: None   Collection Time: 05/17/23  8:49 PM   Specimen: BLOOD  Result Value Ref Range Status   Specimen Description   Final    BLOOD LEFT ANTECUBITAL Performed at [facility], [address], [place], [place] [address]    Special Requests   Final    BOTTLES DRAWN AEROBIC AND ANAEROBIC Blood Culture adequate volume Performed at [facility], [address] [place], [place] [address]    Culture   Final    NO GROWTH 5 DAYS Performed at [facility], [address] [place], [place] [address]    Report Status 05/22/2023 FINAL  Final  Pleural fluid culture w Gram Stain     Status: None (Preliminary result)   Collection Time: 05/23/23  3:01 PM   Specimen: Pleural Fluid  Result Value Ref Range Status   Specimen Description   Final    PLEURAL Performed at [facility], [address], [place], [place] [address]    Special Requests   Final    NONE Performed at [facility], [address], [place], [place] [address]    Gram Stain NO WBC SEEN NO ORGANISMS SEEN CYTOSPIN SMEAR   Final   Culture   Final    NO GROWTH 3 DAYS Performed at [facility], [address], [place], [place] [address]    Report Status PENDING  Incomplete      Radiology Studies: No results found.    Scheduled Meds:  alteplase  2 mg Intracatheter Once   amoxicillin-clavulanate  1 tablet Oral Q12H   Chlorhexidine Gluconate Cloth  6 each Topical Daily   ferrous sulfate  325 mg Oral Q breakfast   heparin injection (subcutaneous)  5,000 Units Subcutaneous Q8H   LORazepam  0.5 mg Oral QHS   metoprolol succinate  25 mg Oral Daily   multivitamin with minerals   1 tablet Oral Daily   mouth rinse  15 mL Mouth Rinse 4 times per day   sertraline  25 mg Oral Daily   sodium chloride flush  10-40 mL Intracatheter Q12H   Continuous Infusions:   LOS: 9 days   Time spent: 30 minutes   Noralee Stain, DO [facility] 05/26/2023, 2:15 PM   Available via Epic secure chat 7am-7pm After these hours, please refer to coverage provider listed on [email or web address]

## 2023-05-27 DIAGNOSIS — J189 Pneumonia, unspecified organism: Secondary | ICD-10-CM | POA: Diagnosis not present

## 2023-05-27 DIAGNOSIS — A419 Sepsis, unspecified organism: Secondary | ICD-10-CM | POA: Diagnosis not present

## 2023-05-27 DIAGNOSIS — F32A Depression, unspecified: Secondary | ICD-10-CM | POA: Diagnosis not present

## 2023-05-27 DIAGNOSIS — I10 Essential (primary) hypertension: Secondary | ICD-10-CM | POA: Diagnosis not present

## 2023-05-27 DIAGNOSIS — D509 Iron deficiency anemia, unspecified: Secondary | ICD-10-CM | POA: Diagnosis not present

## 2023-05-27 DIAGNOSIS — I480 Paroxysmal atrial fibrillation: Secondary | ICD-10-CM | POA: Diagnosis not present

## 2023-05-27 DIAGNOSIS — R799 Abnormal finding of blood chemistry, unspecified: Secondary | ICD-10-CM | POA: Diagnosis not present

## 2023-05-27 DIAGNOSIS — I4891 Unspecified atrial fibrillation: Secondary | ICD-10-CM | POA: Diagnosis not present

## 2023-05-27 DIAGNOSIS — N183 Chronic kidney disease, stage 3 unspecified: Secondary | ICD-10-CM | POA: Diagnosis not present

## 2023-05-27 DIAGNOSIS — J869 Pyothorax without fistula: Secondary | ICD-10-CM | POA: Diagnosis not present

## 2023-05-27 DIAGNOSIS — F331 Major depressive disorder, recurrent, moderate: Secondary | ICD-10-CM | POA: Diagnosis not present

## 2023-05-27 DIAGNOSIS — Z23 Encounter for immunization: Secondary | ICD-10-CM | POA: Diagnosis not present

## 2023-05-27 DIAGNOSIS — A403 Sepsis due to Streptococcus pneumoniae: Secondary | ICD-10-CM | POA: Diagnosis not present

## 2023-05-27 DIAGNOSIS — Z7401 Bed confinement status: Secondary | ICD-10-CM | POA: Diagnosis not present

## 2023-05-27 DIAGNOSIS — Z7185 Encounter for immunization safety counseling: Secondary | ICD-10-CM | POA: Diagnosis not present

## 2023-05-27 DIAGNOSIS — F411 Generalized anxiety disorder: Secondary | ICD-10-CM | POA: Diagnosis not present

## 2023-05-27 DIAGNOSIS — J188 Other pneumonia, unspecified organism: Secondary | ICD-10-CM | POA: Diagnosis not present

## 2023-05-27 DIAGNOSIS — D72829 Elevated white blood cell count, unspecified: Secondary | ICD-10-CM | POA: Diagnosis not present

## 2023-05-27 DIAGNOSIS — R059 Cough, unspecified: Secondary | ICD-10-CM | POA: Diagnosis not present

## 2023-05-27 DIAGNOSIS — R457 State of emotional shock and stress, unspecified: Secondary | ICD-10-CM | POA: Diagnosis not present

## 2023-05-27 DIAGNOSIS — A409 Streptococcal sepsis, unspecified: Secondary | ICD-10-CM | POA: Diagnosis not present

## 2023-05-27 DIAGNOSIS — E46 Unspecified protein-calorie malnutrition: Secondary | ICD-10-CM | POA: Diagnosis not present

## 2023-05-27 DIAGNOSIS — J13 Pneumonia due to Streptococcus pneumoniae: Secondary | ICD-10-CM | POA: Diagnosis not present

## 2023-05-27 DIAGNOSIS — Z5189 Encounter for other specified aftercare: Secondary | ICD-10-CM | POA: Diagnosis not present

## 2023-05-27 DIAGNOSIS — M6281 Muscle weakness (generalized): Secondary | ICD-10-CM | POA: Diagnosis not present

## 2023-05-27 DIAGNOSIS — R7881 Bacteremia: Secondary | ICD-10-CM | POA: Diagnosis not present

## 2023-05-27 DIAGNOSIS — F321 Major depressive disorder, single episode, moderate: Secondary | ICD-10-CM | POA: Diagnosis not present

## 2023-05-27 DIAGNOSIS — E440 Moderate protein-calorie malnutrition: Secondary | ICD-10-CM | POA: Diagnosis not present

## 2023-05-27 DIAGNOSIS — F419 Anxiety disorder, unspecified: Secondary | ICD-10-CM | POA: Diagnosis not present

## 2023-05-27 DIAGNOSIS — R0989 Other specified symptoms and signs involving the circulatory and respiratory systems: Secondary | ICD-10-CM | POA: Diagnosis not present

## 2023-05-27 DIAGNOSIS — R278 Other lack of coordination: Secondary | ICD-10-CM | POA: Diagnosis not present

## 2023-05-27 LAB — BASIC METABOLIC PANEL
Anion gap: 8 (ref 5–15)
BUN: 12 mg/dL (ref 8–23)
CO2: 23 mmol/L (ref 22–32)
Calcium: 8.4 mg/dL — ABNORMAL LOW (ref 8.9–10.3)
Chloride: 103 mmol/L (ref 98–111)
Creatinine, Ser: 0.9 mg/dL (ref 0.44–1.00)
GFR, Estimated: >60 mL/min (ref >60)
Glucose, Bld: 90 mg/dL (ref 70–99)
Potassium: 4.4 mmol/L (ref 3.5–5.1)
Sodium: 134 mmol/L — ABNORMAL LOW (ref 135–145)

## 2023-05-27 LAB — BODY FLUID CULTURE W GRAM STAIN
Culture: NO GROWTH
Gram Stain: NONE SEEN

## 2023-05-27 LAB — CBC
HCT: 27.1 % — ABNORMAL LOW (ref 36.0–46.0)
Hemoglobin: 9 g/dL — ABNORMAL LOW (ref 12.0–15.0)
MCH: 34.5 pg — ABNORMAL HIGH (ref 26.0–34.0)
MCHC: 33.2 g/dL (ref 30.0–36.0)
MCV: 103.8 fL — ABNORMAL HIGH (ref 80.0–100.0)
Platelets: 455 10*3/uL — ABNORMAL HIGH (ref 150–400)
RBC: 2.61 MIL/uL — ABNORMAL LOW (ref 3.87–5.11)
RDW: 13.4 % (ref 11.5–15.5)
WBC: 13.2 10*3/uL — ABNORMAL HIGH (ref 4.0–10.5)
nRBC: 0 % (ref 0.0–0.2)

## 2023-05-27 MED ORDER — SACCHAROMYCES BOULARDII 250 MG PO CAPS: 250.0000 mg | ORAL_CAPSULE | Freq: Two times a day (BID) | ORAL | Status: AC

## 2023-05-27 MED ORDER — SENNOSIDES-DOCUSATE SODIUM 8.6-50 MG PO TABS: 1.0000 | ORAL_TABLET | Freq: Every evening | ORAL | Status: AC | PRN

## 2023-05-27 MED ORDER — AMOXICILLIN-POT CLAVULANATE 875-125 MG PO TABS: 1.0000 | ORAL_TABLET | Freq: Two times a day (BID) | ORAL | 0 refills | Status: AC

## 2023-05-27 NOTE — Care Management Important Message (Signed)
Important Message  Patient Details  Name: Alicia Schwartz MRN: [id number] Date of Birth: 1931-07-21   Important Message Given:  Yes - Medicare IM     Caren Macadam 05/27/2023, 2:15 PM

## 2023-05-27 NOTE — Progress Notes (Signed)
Physical Therapy Treatment Patient Details Name: Alicia Schwartz MRN: [id number] DOB: Jun 23, 1931 Today's Date: 05/27/2023   History of Present Illness Alicia Schwartz is a 89 y.o. female with medical history significant for hypertension, paroxysmal SVT being admitted to the hospital with acute hypoxic respiratory failure and weakness likely due to community-acquired pneumonia    PT Comments  Pt received in bed, politely declined mobility +/or sitting EOB. Pt participated in B LE AA/AROM and repositioning to upright sitting in bed. Remains limited by fatigue and weakness. Pt assisted with lunch set up and feeding. Slow progression due to multiple co-morbidities. Will continue acutely per POC.    If plan is discharge home, recommend the following: Help with stairs or ramp for entrance;A little help with bathing/dressing/bathroom;Assist for transportation;Assistance with cooking/housework;Direct supervision/assist for medications management;A lot of help with walking and/or transfers   Can travel by private vehicle     No  Equipment Recommendations  None recommended by PT    Recommendations for Other Services       Precautions / Restrictions Precautions Precautions: Fall Precaution Comments: O2 Restrictions Weight Bearing Restrictions: No     Mobility  Bed Mobility Overal bed mobility: Needs Assistance Bed Mobility: Rolling Rolling: Mod assist, Used rails         General bed mobility comments: Pt refused to sit EOB    Transfers                   General transfer comment: Pt refused OOB activity    Ambulation/Gait               General Gait Details: NT   Stairs             Wheelchair Mobility     Tilt Bed    Modified Rankin (Stroke Patients Only)       Balance                                            Cognition Arousal: Alert Behavior During Therapy: WFL for tasks assessed/performed Overall Cognitive  Status: No family/caregiver present to determine baseline cognitive functioning Area of Impairment: Problem solving, Memory, Awareness                               General Comments: pt very HOH and has B hearing aids        Exercises General Exercises - Lower Extremity Ankle Circles/Pumps: AROM, Both, 15 reps, Supine Heel Slides: AROM, Both, 10 reps, Supine Hip ABduction/ADduction: AROM, Both, 10 reps, Supine    General Comments General comments (skin integrity, edema, etc.): Pt very HOH, Communicated with written words and visual cues. Pt declined OOB despite education on benefits of mobility      Pertinent Vitals/Pain Pain Assessment Pain Assessment: No/denies pain    Home Living                          Prior Function            PT Goals (current goals can now be found in the care plan section) Acute Rehab PT Goals Patient Stated Goal: Feel better    Frequency    Min 1X/week      PT Plan      Co-evaluation  AM-PAC PT "6 Clicks" Mobility   Outcome Measure  Help needed turning from your back to your side while in a flat bed without using bedrails?: A Little Help needed moving from lying on your back to sitting on the side of a flat bed without using bedrails?: A Lot Help needed moving to and from a bed to a chair (including a wheelchair)?: A Lot Help needed standing up from a chair using your arms (e.g., wheelchair or bedside chair)?: A Lot Help needed to walk in hospital room?: Total Help needed climbing 3-5 steps with a railing? : Total 6 Click Score: 11    End of Session Equipment Utilized During Treatment: Oxygen Activity Tolerance: Patient limited by fatigue Patient left: with call bell/phone within reach;with nursing/sitter in room;in chair Nurse Communication: Mobility status PT Visit Diagnosis: Other abnormalities of gait and mobility (R26.89);Difficulty in walking, not elsewhere classified (R26.2)      Time: 1610-9604 PT Time Calculation (min) (ACUTE ONLY): 16 min  Charges:    $Therapeutic Exercise: 8-22 mins PT General Charges $$ ACUTE PT VISIT: 1 Visit                    Zadie Cleverly, PTA  Jannet Askew 05/27/2023, 1:27 PM

## 2023-05-27 NOTE — TOC Progression Note (Signed)
Transition of Care [facility]) - Progression Note    Patient Details  Name: Alicia Schwartz MRN: [id number] Date of Birth: 1931-03-22  [facility] CM/SW Contact  Avamae Dehaan, Olegario Messier, RN Phone Number: 05/27/2023, 11:56 AM  Clinical Narrative: Checking w/[facility] rep Raoul Pitch if bed available today;auth ends today. Await update.      Expected Discharge Plan: Skilled Nursing Facility Barriers to Discharge: Continued Medical Work up  Expected Discharge Plan and Services   Discharge Planning Services: CM Consult Post Acute Care Choice: Skilled Nursing Facility Living arrangements for the past 2 months: Single Family Home                                       Social Determinants of Health (SDOH) Interventions SDOH Screenings   Food Insecurity: No Food Insecurity (05/20/2023)  Housing: Low Risk  (05/20/2023)  Transportation Needs: Patient Unable To Answer (05/20/2023)  Utilities: Patient Unable To Answer (05/20/2023)  Tobacco Use: Low Risk  (05/17/2023)    Readmission Risk Interventions     No data to display

## 2023-05-27 NOTE — Progress Notes (Signed)
Spoke with receiving caregiver Fatmata & gave her detailed report on patient & answered all questions & concerns

## 2023-05-27 NOTE — TOC Transition Note (Addendum)
Transition of Care [facility]) - CM/SW Discharge Note   Patient Details  Name: Alicia Schwartz MRN: [id number] Date of Birth: 05-Feb-1931  [facility]) CM/SW Contact:  Lanier Clam, RN Phone Number: 05/27/2023, 1:45 PM   Clinical Narrative:  d/c today to [name] has auth till today rep [name] aware. Await d/c summary prior PTAR. MD updated. -2:56p d/c summary sent. PTAR called. Going to [facility] [address] rm#[id number],report tel#[phone]. No further CM needs.    Final next level of care: Skilled Nursing Facility Barriers to Discharge: No Barriers Identified   Patient Goals and CMS Choice CMS Medicare.gov Compare Post Acute Care list provided to:: Patient Represenative (must comment) ([name]) Choice offered to / list presented to : Adult Children  Discharge Placement                         Discharge Plan and Services Additional resources added to the After Visit Summary for     Discharge Planning Services: CM Consult Post Acute Care Choice: Skilled Nursing Facility                               Social Determinants of Health (SDOH) Interventions SDOH Screenings   Food Insecurity: No Food Insecurity (05/20/2023)  Housing: Low Risk  (05/20/2023)  Transportation Needs: Patient Unable To Answer (05/20/2023)  Utilities: Patient Unable To Answer (05/20/2023)  Tobacco Use: Low Risk  (05/17/2023)     Readmission Risk Interventions     No data to display

## 2023-05-27 NOTE — Discharge Summary (Signed)
Physician Discharge Summary  Alicia Schwartz:[id number] DOB: 1930-10-10 DOA: 05/17/2023  PCP: Linus Galas, NP  Admit date: 05/17/2023 Discharge date: 05/27/2023  Admitted From: [facility] Disposition:  nh  Recommendations for Outpatient Follow-up:  Follow up with PCP in 1-2 weeks Please obtain BMP/CBC in one week Please follow up with dr vu 12/2  Home Health:none Equipment/Devices: None  Discharge Condition: Stable  CODE STATUS: DNR Diet recommendation: Cardiac  Brief/Interim Summary:  89 year old F with PMH of paroxysmal SVT, HTN and diminished hearing who presented to the hospital 05/17/2023 due to generalized weakness, ambulatory dysfunction, acute on chronic low back pain.  She was found to have leukocytosis, right-sided pleural effusion.  She was started on IV Rocephin, azithromycin and admitted for severe sepsis secondary to community-acquired pneumonia.  Blood culture showed Streptococcus pneumonia.  Infectious disease consulted.  CT chest revealed moderate right and small left pleural effusion, loculated right-sided fissure component complete collapse of right lower lobe, partial collapse right middle lobe and left lower lobe due to pleural effusion.  PCCM consulted and patient underwent thoracentesis on 10/31. 50 cc of straw-colored fluid was drained and sent for analysis-Gram stain no organism, LDH elevated 455 protein 3.1, total nucleated cells 194 with 86% neutrophil.  Discharge Diagnoses:  Principal Problem:   Sepsis due to pneumonia [facility]) Active Problems:   AKI (acute kidney injury) (HCC)   Physical deconditioning   Parapneumonic effusion   Bacteremia   CKD stage 3b, GFR 30-44 ml/min (HCC)   Paroxysmal atrial fibrillation (HCC)  Severe sepsis secondary to Streptococcus pneumonia, strep bacteremia, loculated pleural effusion -Status post thoracentesis 10/31 -Recommend Augmentin x 6 weeks.  She has 78 doses left.  The last date of the biotic should be July 05, 2023.   Follow-up with infectious disease 06/24/2023 Dr. Dorna Bloom.  She was seen by PCCM and infectious disease.     AKI on CKD stage IIIb -Baseline creatinine 1.2-1.3   New onset A-fib secondary to sepsis.  Continue Toprol.  Not on anticoagulation due to high fall risk.   Generalized weakness, deconditioning, ambulatory dysfunction-discharge to SNF     Pressure Injury 07/04/19 Sacrum Medial;Posterior Stage I -  Intact skin with non-blanchable redness of a localized area usually over a bony prominence. (Active)  07/04/19 0400  Location: Sacrum  Location Orientation: Medial;Posterior  Staging: Stage I -  Intact skin with non-blanchable redness of a localized area usually over a bony prominence.  Wound Description (Comments):   Present on Admission: Yes  Dressing Type Foam - Lift dressing to assess site every shift;Moisture barrier 05/26/23 0945     Estimated body mass index is 21.06 kg/m as calculated from the following:   Height as of this encounter: 4\' 11"  (1.499 m).   Weight as of this encounter: 47.3 kg.  Discharge Instructions  Discharge Instructions     Diet - low sodium heart healthy   Complete by: As directed    Increase activity slowly   Complete by: As directed    No wound care   Complete by: As directed       Allergies as of 05/27/2023   No Known Allergies      Medication List     STOP taking these medications    ferrous sulfate 325 (65 FE) MG tablet       TAKE these medications    acetaminophen 325 MG tablet Commonly known as: TYLENOL Take 2 tablets (650 mg total) by mouth every 6 (six) hours as needed for mild pain (  or Fever >/= 101).   amoxicillin-clavulanate 875-125 MG tablet Commonly known as: AUGMENTIN Take 1 tablet by mouth 2 (two) times daily for 78 doses.   LORazepam 0.5 MG tablet Commonly known as: ATIVAN Take 0.5 mg by mouth at bedtime.   losartan 25 MG tablet Commonly known as: COZAAR Take 3 tablets (75 mg total) by mouth daily. What  changed:  how much to take when to take this   metoprolol succinate 25 MG 24 hr tablet Commonly known as: TOPROL-XL Take 1 tablet (25 mg total) by mouth daily.   multivitamin tablet Take 1 tablet by mouth daily.   senna-docusate 8.6-50 MG tablet Commonly known as: Senokot-S Take 1 tablet by mouth at bedtime as needed for mild constipation.   sertraline 25 MG tablet Commonly known as: ZOLOFT Take 25 mg by mouth daily.        Contact information for after-discharge care     Destination     [facility] Preferred SNF .   Service: Skilled Nursing Contact information: [address] [place] [address] [phone]                    No Known Allergies  Consultations: Infection disease and PCCM   Procedures/Studies: DG CHEST PORT 1 VIEW  Result Date: 05/23/2023 CLINICAL DATA:  Status post thoracentesis. EXAM: PORTABLE CHEST 1 VIEW COMPARISON:  May 17, 2023. FINDINGS: No pneumothorax is noted. Stable loculated pleural effusion is noted in right lung base which is slightly decreased in size. IMPRESSION: No pneumothorax status post right thoracentesis. Electronically Signed   By: Lupita Raider M.D.   On: 05/23/2023 17:17   CT CHEST WO CONTRAST  Result Date: 05/20/2023 CLINICAL DATA:  Pneumonia complications suspected, pleural effusion EXAM: CT CHEST WITHOUT CONTRAST TECHNIQUE: Multidetector CT imaging of the chest was performed following the standard protocol without IV contrast. RADIATION DOSE REDUCTION: This exam was performed according to the departmental dose-optimization program which includes automated exposure control, adjustment of the mA and/or kV according to patient size and/or use of iterative reconstruction technique. COMPARISON:  Lumbar spine CT dated June 28, 2019; thoracic spine radiograph dated 25th 2022 FINDINGS: Cardiovascular: Cardiomegaly. Trace pericardial effusion. Severe  atherosclerotic disease of the thoracic aorta. Area tortuosity and narrowing involving the mid descending thoracic aorta. Severe coronary artery calcifications. Mediastinum/Nodes: Esophagus and thyroid are unremarkable. No enlarged lymph nodes seen in the chest. Lungs/Pleura: Central airways are patent. Moderate right and small left pleural effusions, including a loculated fissural component. Within limitations of noncontrast exam, no evidence of pleural thickening. Complete collapse of the right lower lobe and partial collapse of the right middle lobe and left lower lobe. No pneumothorax. Upper Abdomen: No acute abnormality. Musculoskeletal: Age-indeterminate severe compression deformity T9. Additional severe compression deformity of T12 with vertebroplasty changes. Moderate age-indeterminate deformity of the superior endplate of L1, unchanged when compared with the prior lumbar spine CT. IMPRESSION: 1. Moderate right and small left pleural effusions, including a loculated right-sided fissural component. 2. Complete collapse of the right lower lobe and partial collapse of the right middle lobe and left lower lobe secondary to pleural effusions. 3. Age-indeterminate severe compression deformity of T9. Correlate for point tenderness. 4. Severe aortic Atherosclerosis (ICD10-I70.0) with area of tortuosity and focal narrowing involving the descending thoracic aorta, likely unchanged when compared with calcification pattern on prior thoracic spine radiograph dated Dec 14, 2020, although lack of IV contrast limits evaluation. Electronically Signed   By: Tacey Ruiz  Strickland M.D.   On: 05/20/2023 16:09   ECHOCARDIOGRAM COMPLETE  Result Date: 05/19/2023    ECHOCARDIOGRAM REPORT   Patient Name:   [name] Date of Exam: 05/19/2023 Medical Rec #:  [id number]           Height:       59.0 in Accession #:    [id number]          Weight:       104.3 lb Date of Birth:  1931-04-17            BSA:          1.398 m Patient  Age:    89 years            BP:           141/72 mmHg Patient Gender: F                   HR:           97 bpm. Exam Location:  Inpatient Procedure: 2D Echo, Cardiac Doppler and Color Doppler Indications:    Bacteremia R78.81                 Atrial Fibrillation I48.91  History:        Patient has no prior history of Echocardiogram examinations.                 Arrythmias:Tachycardia; Risk Factors:Hypertension.  Sonographer:    Lucendia Herrlich RCS Referring Phys: [id number] TAYE T GONFA IMPRESSIONS  1. Left ventricular ejection fraction, by estimation, is 50 to 55%. The left ventricle has low normal function. The left ventricle has no regional wall motion abnormalities. There is mild asymmetric left ventricular hypertrophy of the basal-septal segment. Left ventricular diastolic function could not be evaluated.  2. Right ventricular systolic function is mildly reduced. The right ventricular size is normal. There is normal pulmonary artery systolic pressure. The estimated right ventricular systolic pressure is 30.7 mmHg.  3. Left atrial size was severely dilated.  4. Right atrial size was mild to moderately dilated.  5. The mitral valve is grossly normal. Moderate mitral valve regurgitation. No evidence of mitral stenosis.  6. The aortic valve is tricuspid. Aortic valve regurgitation is trivial. No aortic stenosis is present.  7. The inferior vena cava is normal in size with greater than 50% respiratory variability, suggesting right atrial pressure of 3 mmHg. FINDINGS  Left Ventricle: Left ventricular ejection fraction, by estimation, is 50 to 55%. The left ventricle has low normal function. The left ventricle has no regional wall motion abnormalities. The left ventricular internal cavity size was normal in size. There is mild asymmetric left ventricular hypertrophy of the basal-septal segment. Left ventricular diastolic function could not be evaluated due to atrial fibrillation. Left ventricular diastolic function could  not be evaluated. Right Ventricle: The right ventricular size is normal. No increase in right ventricular wall thickness. Right ventricular systolic function is mildly reduced. There is normal pulmonary artery systolic pressure. The tricuspid regurgitant velocity is 2.63 m/s, and with an assumed right atrial pressure of 3 mmHg, the estimated right ventricular systolic pressure is 30.7 mmHg. Left Atrium: Left atrial size was severely dilated. Right Atrium: Right atrial size was mild to moderately dilated. Pericardium: There is no evidence of pericardial effusion. Mitral Valve: The mitral valve is grossly normal. Moderate mitral valve regurgitation. No evidence of mitral valve stenosis. Tricuspid Valve: The tricuspid valve is grossly normal. Tricuspid valve regurgitation is mild .  No evidence of tricuspid stenosis. Aortic Valve: The aortic valve is tricuspid. Aortic valve regurgitation is trivial. Aortic regurgitation PHT measures 208 msec. No aortic stenosis is present. Aortic valve peak gradient measures 4.4 mmHg. Pulmonic Valve: The pulmonic valve was grossly normal. Pulmonic valve regurgitation is not visualized. No evidence of pulmonic stenosis. Aorta: The aortic root and ascending aorta are structurally normal, with no evidence of dilitation. Venous: The inferior vena cava is normal in size with greater than 50% respiratory variability, suggesting right atrial pressure of 3 mmHg. IAS/Shunts: The atrial septum is grossly normal.  LEFT VENTRICLE PLAX 2D LVIDd:         3.70 cm   Diastology LVIDs:         2.60 cm   LV e' medial:    8.16 cm/s LV PW:         0.90 cm   LV E/e' medial:  11.2 LV IVS:        1.10 cm   LV e' lateral:   13.60 cm/s LVOT diam:     1.90 cm   LV E/e' lateral: 6.7 LV SV:         27 LV SV Index:   19 LVOT Area:     2.84 cm  RIGHT VENTRICLE             IVC RV S prime:     10.40 cm/s  IVC diam: 1.60 cm TAPSE (M-mode): 1.4 cm LEFT ATRIUM             Index        RIGHT ATRIUM           Index LA  diam:        4.40 cm 3.15 cm/m   RA Area:     12.90 cm LA Vol (A2C):   66.7 ml 47.70 ml/m  RA Volume:   27.60 ml  19.74 ml/m LA Vol (A4C):   79.4 ml 56.78 ml/m LA Biplane Vol: 77.1 ml 55.13 ml/m  AORTIC VALVE AV Area (Vmax): 1.69 cm AV Vmax:        105.00 cm/s AV Peak Grad:   4.4 mmHg LVOT Vmax:      62.57 cm/s LVOT Vmean:     40.833 cm/s LVOT VTI:       0.095 m AI PHT:         208 msec  AORTA Ao Root diam: 3.00 cm Ao Asc diam:  3.40 cm MITRAL VALVE               TRICUSPID VALVE MV Area (PHT): 4.49 cm    TR Peak grad:   27.7 mmHg MV Decel Time: 169 msec    TR Vmax:        263.00 cm/s MR Peak grad: 120.1 mmHg MR Vmax:      548.00 cm/s  SHUNTS MV E velocity: 91.30 cm/s  Systemic VTI:  0.10 m MV A velocity: 33.80 cm/s  Systemic Diam: 1.90 cm MV E/A ratio:  2.70 Lennie Odor MD Electronically signed by Lennie Odor MD Signature Date/Time: 05/19/2023/3:15:03 PM    Final    [place] EKG SITE RITE  Result Date: 05/17/2023 If Site Rite image not attached, placement could not be confirmed due to current cardiac rhythm.  DG Chest Port 1 View  Result Date: 05/17/2023 CLINICAL DATA:  Sepsis EXAM: PORTABLE CHEST 1 VIEW COMPARISON:  X-ray 02/23/2022 FINDINGS: Enlarged cardiopericardial silhouette with calcified tortuous and ectatic aorta. Vascular congestion. Small right effusion. There is  rounded opacity in the right lower thorax which could be fluid along the fissure but has a differential. No separate pneumothorax or consolidation. Overlapping cardiac leads. IMPRESSION: Enlarged heart with dilated calcified aorta.  Vascular congestion. Right-sided pleural effusion. Separate rounded opacity in the right lung base could be fluid tracking along fissure but underlying lesion is not excluded. Recommend short follow-up or CT Electronically Signed   By: Karen Kays M.D.   On: 05/17/2023 11:33   (Echo, Carotid, EGD, Colonoscopy, ERCP)    Subjective: Resting in bed trying to eat breakfast appears frail elderly woman  chronically ill-appearing  Discharge Exam: Vitals:   05/27/23 0750 05/27/23 1211  BP: (!) 162/108 (!) 161/89  Pulse: 88 81  Resp: (!) 22 18  Temp: 98 F (36.7 C) (!) 97.2 F (36.2 C)  SpO2: 98% 99%   Vitals:   05/26/23 2117 05/27/23 0425 05/27/23 0750 05/27/23 1211  BP: (!) 147/82 (!) 155/79 (!) 162/108 (!) 161/89  Pulse: 85 64 88 81  Resp: 18 18 (!) 22 18  Temp: 97.7 F (36.5 C) 97.9 F (36.6 C) 98 F (36.7 C) (!) 97.2 F (36.2 C)  TempSrc: Oral Oral Oral Oral  SpO2: 99% 100% 98% 99%  Weight:      Height:        General: Pt is alert, awake, not in acute distress Cardiovascular: RRR, S1/S2 +, no rubs, no gallops Respiratory: Coarse bilaterally, no wheezing, no rhonchi Abdominal: Soft, NT, ND, bowel sounds + Extremities: no edema, no cyanosis    The results of significant diagnostics from this hospitalization (including imaging, microbiology, ancillary and laboratory) are listed below for reference.     Microbiology: Recent Results (from the past 240 hour(s))  Blood Culture (routine x 2)     Status: None   Collection Time: 05/17/23  8:49 PM   Specimen: BLOOD  Result Value Ref Range Status   Specimen Description   Final    BLOOD LEFT ANTECUBITAL Performed at [facility], [address], [place], [place] [address]    Special Requests   Final    BOTTLES DRAWN AEROBIC AND ANAEROBIC Blood Culture adequate volume Performed at [facility], [address] [place], [place] [address]    Culture   Final    NO GROWTH 5 DAYS Performed at [facility], [address], [place], [place] [address]    Report Status 05/22/2023 FINAL  Final  Pleural fluid culture w Gram Stain     Status: None   Collection Time: 05/23/23  3:01 PM   Specimen: Pleural Fluid  Result Value Ref Range Status   Specimen Description   Final    PLEURAL Performed at [facility], [address] [place], [place] [address]    Special Requests    Final    NONE Performed at [facility], [address] [place], [place] [address]    Gram Stain NO WBC SEEN NO ORGANISMS SEEN CYTOSPIN SMEAR   Final   Culture   Final    NO GROWTH 3 DAYS Performed at [facility], [address] [place], [place] [address]    Report Status 05/27/2023 FINAL  Final     Labs: BNP (last 3 results) Recent Labs    05/17/23 1305  BNP 259.8*   Basic Metabolic Panel: Recent Labs  Lab 05/22/23 0323 05/23/23 0221 05/24/23 0313 05/25/23 0420 05/27/23 0309  NA 134* 134* 133* 132* 134*  K  4.2 4.0 3.8 4.2 4.4  CL 105 105 105 104 103  CO2 23 22 22 22 23   GLUCOSE 94 96 122* 96 90  BUN 18 18 16 14 12   CREATININE 0.93 0.76 0.80 0.81 0.90  CALCIUM 8.0* 7.9* 7.8* 7.9* 8.4*   Liver Function Tests: Recent Labs  Lab 05/23/23 1819  PROT 5.9*   No results for input(s): "LIPASE", "AMYLASE" in the last 168 hours. No results for input(s): "AMMONIA" in the last 168 hours. CBC: Recent Labs  Lab 05/22/23 0323 05/23/23 0221 05/24/23 0313 05/25/23 0420 05/27/23 0309  WBC 18.2* 18.3* 16.1* 14.7* 13.2*  HGB 9.6* 9.0* 8.5* 8.5* 9.0*  HCT 29.4* 27.1* 25.7* 25.8* 27.1*  MCV 103.9* 103.0* 104.0* 104.0* 103.8*  PLT 383 368 442* 397 455*   Cardiac Enzymes: No results for input(s): "CKTOTAL", "CKMB", "CKMBINDEX", "TROPONINI" in the last 168 hours. BNP: Invalid input(s): "POCBNP" CBG: Recent Labs  Lab 05/21/23 1055  GLUCAP 97   D-Dimer No results for input(s): "DDIMER" in the last 72 hours. Hgb A1c No results for input(s): "HGBA1C" in the last 72 hours. Lipid Profile No results for input(s): "CHOL", "HDL", "LDLCALC", "TRIG", "CHOLHDL", "LDLDIRECT" in the last 72 hours. Thyroid function studies No results for input(s): "TSH", "T4TOTAL", "T3FREE", "THYROIDAB" in the last 72 hours.  Invalid input(s): "FREET3" Anemia work up No results for input(s): "VITAMINB12", "FOLATE", "FERRITIN", "TIBC", "IRON", "RETICCTPCT" in the  last 72 hours. Urinalysis    Component Value Date/Time   COLORURINE YELLOW 05/17/2023 1324   APPEARANCEUR CLEAR 05/17/2023 1324   LABSPEC 1.014 05/17/2023 1324   PHURINE 5.0 05/17/2023 1324   GLUCOSEU NEGATIVE 05/17/2023 1324   HGBUR NEGATIVE 05/17/2023 1324   BILIRUBINUR NEGATIVE 05/17/2023 1324   KETONESUR NEGATIVE 05/17/2023 1324   PROTEINUR NEGATIVE 05/17/2023 1324   NITRITE NEGATIVE 05/17/2023 1324   LEUKOCYTESUR NEGATIVE 05/17/2023 1324   Sepsis Labs Recent Labs  Lab 05/23/23 0221 05/24/23 0313 05/25/23 0420 05/27/23 0309  WBC 18.3* 16.1* 14.7* 13.2*   Microbiology Recent Results (from the past 240 hour(s))  Blood Culture (routine x 2)     Status: None   Collection Time: 05/17/23  8:49 PM   Specimen: BLOOD  Result Value Ref Range Status   Specimen Description   Final    BLOOD LEFT ANTECUBITAL Performed at [facility], [address] [place], [place] [address]    Special Requests   Final    BOTTLES DRAWN AEROBIC AND ANAEROBIC Blood Culture adequate volume Performed at [facility], [address], [place], [place] [address]    Culture   Final    NO GROWTH 5 DAYS Performed at [facility], [address], [place], [place] [address]    Report Status 05/22/2023 FINAL  Final  Pleural fluid culture w Gram Stain     Status: None   Collection Time: 05/23/23  3:01 PM   Specimen: Pleural Fluid  Result Value Ref Range Status   Specimen Description   Final    PLEURAL Performed at [facility], [address], [place], [place] [address]    Special Requests   Final    NONE Performed at [facility], [address], [place], [place] [address]    Gram Stain NO WBC SEEN NO ORGANISMS SEEN CYTOSPIN SMEAR   Final   Culture   Final    NO GROWTH 3 DAYS Performed at [facility], [address], [place], [place] [address]  Report Status 05/27/2023 FINAL  Final     Time coordinating  discharge: 39 minutes  SIGNED: Alwyn Ren, MD  [facility] 05/27/2023, 1:49 PM

## 2023-05-27 NOTE — Plan of Care (Signed)
  Problem: Clinical Measurements: Goal: Respiratory complications will improve Outcome: Progressing Goal: Cardiovascular complication will be avoided Outcome: Progressing   Problem: Coping: Goal: Level of anxiety will decrease Outcome: Progressing   Problem: Safety: Goal: Ability to remain free from injury will improve Outcome: Progressing

## 2023-05-28 DIAGNOSIS — D72829 Elevated white blood cell count, unspecified: Secondary | ICD-10-CM | POA: Diagnosis not present

## 2023-05-28 DIAGNOSIS — I4891 Unspecified atrial fibrillation: Secondary | ICD-10-CM | POA: Diagnosis not present

## 2023-05-28 DIAGNOSIS — A419 Sepsis, unspecified organism: Secondary | ICD-10-CM | POA: Diagnosis not present

## 2023-05-28 DIAGNOSIS — N183 Chronic kidney disease, stage 3 unspecified: Secondary | ICD-10-CM | POA: Diagnosis not present

## 2023-05-28 DIAGNOSIS — R278 Other lack of coordination: Secondary | ICD-10-CM | POA: Diagnosis not present

## 2023-05-28 DIAGNOSIS — I480 Paroxysmal atrial fibrillation: Secondary | ICD-10-CM | POA: Diagnosis not present

## 2023-05-28 DIAGNOSIS — J13 Pneumonia due to Streptococcus pneumoniae: Secondary | ICD-10-CM | POA: Diagnosis not present

## 2023-05-28 DIAGNOSIS — R7881 Bacteremia: Secondary | ICD-10-CM | POA: Diagnosis not present

## 2023-05-28 DIAGNOSIS — F419 Anxiety disorder, unspecified: Secondary | ICD-10-CM | POA: Diagnosis not present

## 2023-05-28 DIAGNOSIS — F32A Depression, unspecified: Secondary | ICD-10-CM | POA: Diagnosis not present

## 2023-05-28 DIAGNOSIS — M6281 Muscle weakness (generalized): Secondary | ICD-10-CM | POA: Diagnosis not present

## 2023-05-28 DIAGNOSIS — Z5189 Encounter for other specified aftercare: Secondary | ICD-10-CM | POA: Diagnosis not present

## 2023-05-30 DIAGNOSIS — N183 Chronic kidney disease, stage 3 unspecified: Secondary | ICD-10-CM | POA: Diagnosis not present

## 2023-05-30 DIAGNOSIS — J13 Pneumonia due to Streptococcus pneumoniae: Secondary | ICD-10-CM | POA: Diagnosis not present

## 2023-05-30 DIAGNOSIS — F411 Generalized anxiety disorder: Secondary | ICD-10-CM | POA: Diagnosis not present

## 2023-05-30 DIAGNOSIS — I4891 Unspecified atrial fibrillation: Secondary | ICD-10-CM | POA: Diagnosis not present

## 2023-05-30 DIAGNOSIS — A419 Sepsis, unspecified organism: Secondary | ICD-10-CM | POA: Diagnosis not present

## 2023-05-30 DIAGNOSIS — D72829 Elevated white blood cell count, unspecified: Secondary | ICD-10-CM | POA: Diagnosis not present

## 2023-05-30 DIAGNOSIS — M6281 Muscle weakness (generalized): Secondary | ICD-10-CM | POA: Diagnosis not present

## 2023-05-30 DIAGNOSIS — F331 Major depressive disorder, recurrent, moderate: Secondary | ICD-10-CM | POA: Diagnosis not present

## 2023-05-30 DIAGNOSIS — Z5189 Encounter for other specified aftercare: Secondary | ICD-10-CM | POA: Diagnosis not present

## 2023-05-31 DIAGNOSIS — I480 Paroxysmal atrial fibrillation: Secondary | ICD-10-CM | POA: Diagnosis not present

## 2023-05-31 DIAGNOSIS — M6281 Muscle weakness (generalized): Secondary | ICD-10-CM | POA: Diagnosis not present

## 2023-05-31 DIAGNOSIS — F419 Anxiety disorder, unspecified: Secondary | ICD-10-CM | POA: Diagnosis not present

## 2023-05-31 DIAGNOSIS — R278 Other lack of coordination: Secondary | ICD-10-CM | POA: Diagnosis not present

## 2023-05-31 DIAGNOSIS — F32A Depression, unspecified: Secondary | ICD-10-CM | POA: Diagnosis not present

## 2023-05-31 DIAGNOSIS — R7881 Bacteremia: Secondary | ICD-10-CM | POA: Diagnosis not present

## 2023-06-04 DIAGNOSIS — D72829 Elevated white blood cell count, unspecified: Secondary | ICD-10-CM | POA: Diagnosis not present

## 2023-06-04 DIAGNOSIS — M6281 Muscle weakness (generalized): Secondary | ICD-10-CM | POA: Diagnosis not present

## 2023-06-04 DIAGNOSIS — R7881 Bacteremia: Secondary | ICD-10-CM | POA: Diagnosis not present

## 2023-06-04 DIAGNOSIS — Z5189 Encounter for other specified aftercare: Secondary | ICD-10-CM | POA: Diagnosis not present

## 2023-06-04 DIAGNOSIS — F419 Anxiety disorder, unspecified: Secondary | ICD-10-CM | POA: Diagnosis not present

## 2023-06-04 DIAGNOSIS — I4891 Unspecified atrial fibrillation: Secondary | ICD-10-CM | POA: Diagnosis not present

## 2023-06-04 DIAGNOSIS — N183 Chronic kidney disease, stage 3 unspecified: Secondary | ICD-10-CM | POA: Diagnosis not present

## 2023-06-04 DIAGNOSIS — A419 Sepsis, unspecified organism: Secondary | ICD-10-CM | POA: Diagnosis not present

## 2023-06-04 DIAGNOSIS — J13 Pneumonia due to Streptococcus pneumoniae: Secondary | ICD-10-CM | POA: Diagnosis not present

## 2023-06-04 DIAGNOSIS — R278 Other lack of coordination: Secondary | ICD-10-CM | POA: Diagnosis not present

## 2023-06-04 DIAGNOSIS — I480 Paroxysmal atrial fibrillation: Secondary | ICD-10-CM | POA: Diagnosis not present

## 2023-06-04 DIAGNOSIS — F32A Depression, unspecified: Secondary | ICD-10-CM | POA: Diagnosis not present

## 2023-06-05 DIAGNOSIS — N183 Chronic kidney disease, stage 3 unspecified: Secondary | ICD-10-CM | POA: Diagnosis not present

## 2023-06-05 DIAGNOSIS — F331 Major depressive disorder, recurrent, moderate: Secondary | ICD-10-CM | POA: Diagnosis not present

## 2023-06-05 DIAGNOSIS — M6281 Muscle weakness (generalized): Secondary | ICD-10-CM | POA: Diagnosis not present

## 2023-06-05 DIAGNOSIS — I4891 Unspecified atrial fibrillation: Secondary | ICD-10-CM | POA: Diagnosis not present

## 2023-06-05 DIAGNOSIS — D72829 Elevated white blood cell count, unspecified: Secondary | ICD-10-CM | POA: Diagnosis not present

## 2023-06-05 DIAGNOSIS — Z5189 Encounter for other specified aftercare: Secondary | ICD-10-CM | POA: Diagnosis not present

## 2023-06-05 DIAGNOSIS — F411 Generalized anxiety disorder: Secondary | ICD-10-CM | POA: Diagnosis not present

## 2023-06-05 DIAGNOSIS — A419 Sepsis, unspecified organism: Secondary | ICD-10-CM | POA: Diagnosis not present

## 2023-06-05 DIAGNOSIS — J13 Pneumonia due to Streptococcus pneumoniae: Secondary | ICD-10-CM | POA: Diagnosis not present

## 2023-06-05 LAB — PATHOLOGIST SMEAR REVIEW

## 2023-06-06 DIAGNOSIS — D72829 Elevated white blood cell count, unspecified: Secondary | ICD-10-CM | POA: Diagnosis not present

## 2023-06-06 DIAGNOSIS — N183 Chronic kidney disease, stage 3 unspecified: Secondary | ICD-10-CM | POA: Diagnosis not present

## 2023-06-06 DIAGNOSIS — J13 Pneumonia due to Streptococcus pneumoniae: Secondary | ICD-10-CM | POA: Diagnosis not present

## 2023-06-06 DIAGNOSIS — F411 Generalized anxiety disorder: Secondary | ICD-10-CM | POA: Diagnosis not present

## 2023-06-06 DIAGNOSIS — M6281 Muscle weakness (generalized): Secondary | ICD-10-CM | POA: Diagnosis not present

## 2023-06-06 DIAGNOSIS — F331 Major depressive disorder, recurrent, moderate: Secondary | ICD-10-CM | POA: Diagnosis not present

## 2023-06-06 DIAGNOSIS — I4891 Unspecified atrial fibrillation: Secondary | ICD-10-CM | POA: Diagnosis not present

## 2023-06-06 DIAGNOSIS — Z5189 Encounter for other specified aftercare: Secondary | ICD-10-CM | POA: Diagnosis not present

## 2023-06-06 DIAGNOSIS — A419 Sepsis, unspecified organism: Secondary | ICD-10-CM | POA: Diagnosis not present

## 2023-06-07 DIAGNOSIS — R278 Other lack of coordination: Secondary | ICD-10-CM | POA: Diagnosis not present

## 2023-06-07 DIAGNOSIS — M6281 Muscle weakness (generalized): Secondary | ICD-10-CM | POA: Diagnosis not present

## 2023-06-07 DIAGNOSIS — F32A Depression, unspecified: Secondary | ICD-10-CM | POA: Diagnosis not present

## 2023-06-07 DIAGNOSIS — I480 Paroxysmal atrial fibrillation: Secondary | ICD-10-CM | POA: Diagnosis not present

## 2023-06-07 DIAGNOSIS — R7881 Bacteremia: Secondary | ICD-10-CM | POA: Diagnosis not present

## 2023-06-07 DIAGNOSIS — F419 Anxiety disorder, unspecified: Secondary | ICD-10-CM | POA: Diagnosis not present

## 2023-06-10 DIAGNOSIS — I4891 Unspecified atrial fibrillation: Secondary | ICD-10-CM | POA: Diagnosis not present

## 2023-06-10 DIAGNOSIS — N183 Chronic kidney disease, stage 3 unspecified: Secondary | ICD-10-CM | POA: Diagnosis not present

## 2023-06-10 DIAGNOSIS — Z5189 Encounter for other specified aftercare: Secondary | ICD-10-CM | POA: Diagnosis not present

## 2023-06-10 DIAGNOSIS — J13 Pneumonia due to Streptococcus pneumoniae: Secondary | ICD-10-CM | POA: Diagnosis not present

## 2023-06-10 DIAGNOSIS — A419 Sepsis, unspecified organism: Secondary | ICD-10-CM | POA: Diagnosis not present

## 2023-06-10 DIAGNOSIS — M6281 Muscle weakness (generalized): Secondary | ICD-10-CM | POA: Diagnosis not present

## 2023-06-10 DIAGNOSIS — D72829 Elevated white blood cell count, unspecified: Secondary | ICD-10-CM | POA: Diagnosis not present

## 2023-06-11 DIAGNOSIS — R278 Other lack of coordination: Secondary | ICD-10-CM | POA: Diagnosis not present

## 2023-06-11 DIAGNOSIS — F419 Anxiety disorder, unspecified: Secondary | ICD-10-CM | POA: Diagnosis not present

## 2023-06-11 DIAGNOSIS — J13 Pneumonia due to Streptococcus pneumoniae: Secondary | ICD-10-CM | POA: Diagnosis not present

## 2023-06-11 DIAGNOSIS — F331 Major depressive disorder, recurrent, moderate: Secondary | ICD-10-CM | POA: Diagnosis not present

## 2023-06-11 DIAGNOSIS — I4891 Unspecified atrial fibrillation: Secondary | ICD-10-CM | POA: Diagnosis not present

## 2023-06-11 DIAGNOSIS — F411 Generalized anxiety disorder: Secondary | ICD-10-CM | POA: Diagnosis not present

## 2023-06-11 DIAGNOSIS — A419 Sepsis, unspecified organism: Secondary | ICD-10-CM | POA: Diagnosis not present

## 2023-06-11 DIAGNOSIS — F32A Depression, unspecified: Secondary | ICD-10-CM | POA: Diagnosis not present

## 2023-06-11 DIAGNOSIS — R7881 Bacteremia: Secondary | ICD-10-CM | POA: Diagnosis not present

## 2023-06-11 DIAGNOSIS — I480 Paroxysmal atrial fibrillation: Secondary | ICD-10-CM | POA: Diagnosis not present

## 2023-06-11 DIAGNOSIS — M6281 Muscle weakness (generalized): Secondary | ICD-10-CM | POA: Diagnosis not present

## 2023-06-11 DIAGNOSIS — Z5189 Encounter for other specified aftercare: Secondary | ICD-10-CM | POA: Diagnosis not present

## 2023-06-11 DIAGNOSIS — D72829 Elevated white blood cell count, unspecified: Secondary | ICD-10-CM | POA: Diagnosis not present

## 2023-06-11 DIAGNOSIS — N183 Chronic kidney disease, stage 3 unspecified: Secondary | ICD-10-CM | POA: Diagnosis not present

## 2023-06-14 DIAGNOSIS — I480 Paroxysmal atrial fibrillation: Secondary | ICD-10-CM | POA: Diagnosis not present

## 2023-06-14 DIAGNOSIS — F419 Anxiety disorder, unspecified: Secondary | ICD-10-CM | POA: Diagnosis not present

## 2023-06-14 DIAGNOSIS — R7881 Bacteremia: Secondary | ICD-10-CM | POA: Diagnosis not present

## 2023-06-14 DIAGNOSIS — M6281 Muscle weakness (generalized): Secondary | ICD-10-CM | POA: Diagnosis not present

## 2023-06-14 DIAGNOSIS — F32A Depression, unspecified: Secondary | ICD-10-CM | POA: Diagnosis not present

## 2023-06-14 DIAGNOSIS — R278 Other lack of coordination: Secondary | ICD-10-CM | POA: Diagnosis not present

## 2023-06-18 DIAGNOSIS — A419 Sepsis, unspecified organism: Secondary | ICD-10-CM | POA: Diagnosis not present

## 2023-06-18 DIAGNOSIS — Z5189 Encounter for other specified aftercare: Secondary | ICD-10-CM | POA: Diagnosis not present

## 2023-06-18 DIAGNOSIS — F331 Major depressive disorder, recurrent, moderate: Secondary | ICD-10-CM | POA: Diagnosis not present

## 2023-06-18 DIAGNOSIS — I480 Paroxysmal atrial fibrillation: Secondary | ICD-10-CM | POA: Diagnosis not present

## 2023-06-18 DIAGNOSIS — R7881 Bacteremia: Secondary | ICD-10-CM | POA: Diagnosis not present

## 2023-06-18 DIAGNOSIS — F419 Anxiety disorder, unspecified: Secondary | ICD-10-CM | POA: Diagnosis not present

## 2023-06-18 DIAGNOSIS — F411 Generalized anxiety disorder: Secondary | ICD-10-CM | POA: Diagnosis not present

## 2023-06-18 DIAGNOSIS — N183 Chronic kidney disease, stage 3 unspecified: Secondary | ICD-10-CM | POA: Diagnosis not present

## 2023-06-18 DIAGNOSIS — M6281 Muscle weakness (generalized): Secondary | ICD-10-CM | POA: Diagnosis not present

## 2023-06-18 DIAGNOSIS — J13 Pneumonia due to Streptococcus pneumoniae: Secondary | ICD-10-CM | POA: Diagnosis not present

## 2023-06-18 DIAGNOSIS — D72829 Elevated white blood cell count, unspecified: Secondary | ICD-10-CM | POA: Diagnosis not present

## 2023-06-18 DIAGNOSIS — R278 Other lack of coordination: Secondary | ICD-10-CM | POA: Diagnosis not present

## 2023-06-18 DIAGNOSIS — I4891 Unspecified atrial fibrillation: Secondary | ICD-10-CM | POA: Diagnosis not present

## 2023-06-18 DIAGNOSIS — F32A Depression, unspecified: Secondary | ICD-10-CM | POA: Diagnosis not present

## 2023-06-24 ENCOUNTER — Other Ambulatory Visit: Payer: Self-pay

## 2023-06-24 ENCOUNTER — Ambulatory Visit (INDEPENDENT_AMBULATORY_CARE_PROVIDER_SITE_OTHER): Payer: Medicare HMO | Admitting: Internal Medicine

## 2023-06-24 ENCOUNTER — Telehealth: Payer: Self-pay

## 2023-06-24 ENCOUNTER — Encounter: Payer: Self-pay | Admitting: Internal Medicine

## 2023-06-24 VITALS — BP 127/87 | HR 72 | Resp 16 | Ht 59.0 in | Wt 104.0 lb

## 2023-06-24 DIAGNOSIS — J869 Pyothorax without fistula: Secondary | ICD-10-CM | POA: Diagnosis not present

## 2023-06-24 NOTE — Telephone Encounter (Signed)
Spoke to [facility] at [facility] they will have Chest Xray done in house this week and fax [place] the results.  Order placed on order sheet from facility and sent back with patient with info regarding plan, follow up, xray order, and fax number for Xray results. Order sheet also placed for scanning.

## 2023-06-24 NOTE — Patient Instructions (Signed)
Keep taking amoxicillin-clavulonate antibiotics until you see me again in 3-4 weeks   Chest xray to be done this week  Labs today  I will get her chart to the pulmonologist to see if they'd like to see her in follow up as well

## 2023-06-24 NOTE — Telephone Encounter (Addendum)
Medications verified with facility.

## 2023-06-24 NOTE — Progress Notes (Signed)
[facility]  Patient Active Problem List   Diagnosis Date Noted   CKD stage 3b, GFR 30-44 ml/min (HCC) 05/26/2023   Paroxysmal atrial fibrillation (HCC) 05/26/2023   Parapneumonic effusion 05/22/2023   Bacteremia 05/22/2023   Sepsis due to pneumonia (HCC) 05/17/2023   Elevated TSH 02/26/2022   Normocytic anemia 02/24/2022   Hypokalemia    Physical deconditioning    AKI (acute kidney injury) (HCC) 02/23/2022   Lower extremity edema 01/26/2022   Closed T12 fracture (HCC) 07/04/2019   Hypertensive urgency 07/04/2019   Back pain 07/04/2019   Pressure injury of skin 07/04/2019   Postoperative anemia due to acute blood loss 02/14/2015   Paroxysmal supraventricular tachycardia (HCC) 02/12/2015   Fracture, subtrochanteric, right femur, closed (HCC) 02/11/2015   Fall due to stumbling 02/11/2015   Anxiety state 09/19/2006   HTN (hypertension) 09/19/2006      Subjective:    Patient ID: Alicia Schwartz, female    DOB: 1930-08-12, 89 y.o.   MRN: [id number]  Chief Complaint  Patient presents with   Hospitalization Follow-up    HPI:  Alicia Schwartz is a 89 y.o. female here fore f/u empyema hospital admission   06/24/23 id clinic f/u See a&p for detail She was discharged 05/27/23 No complaint today   No Known Allergies    Outpatient Medications Prior to Visit  Medication Sig Dispense Refill   acetaminophen (TYLENOL) 325 MG tablet Take 2 tablets (650 mg total) by mouth every 6 (six) hours as needed for mild pain (or Fever >/= 101).     amoxicillin-clavulanate (AUGMENTIN) 875-125 MG tablet Take 1 tablet by mouth 2 (two) times daily for 78 doses. 78 tablet 0   LORazepam (ATIVAN) 0.5 MG tablet Take 0.5 mg by mouth at bedtime.     losartan (COZAAR) 25 MG tablet Take 3 tablets (75 mg total) by mouth daily. (Patient taking differently: Take 25 mg by mouth in the morning and at bedtime.)     metoprolol succinate (TOPROL-XL) 25 MG 24 hr  tablet Take 1 tablet (25 mg total) by mouth daily.     sertraline (ZOLOFT) 25 MG tablet Take 25 mg by mouth daily.     Multiple Vitamin (MULTIVITAMIN) tablet Take 1 tablet by mouth daily.     saccharomyces boulardii (FLORASTOR) 250 MG capsule Take 1 capsule (250 mg total) by mouth 2 (two) times daily.     senna-docusate (SENOKOT-S) 8.6-50 MG tablet Take 1 tablet by mouth at bedtime as needed for mild constipation.     No facility-administered medications prior to visit.     Social History   Socioeconomic History   Marital status: Widowed    Spouse name: Not on file   Number of children: Not on file   Years of education: Not on file   Highest education level: Not on file  Occupational History   Not on file  Tobacco Use   Smoking status: Never   Smokeless tobacco: Never  Substance and Sexual Activity   Alcohol use: No   Drug use: Not on file   Sexual activity: Not on file  Other Topics Concern   Not on file  Social History Narrative   Not on file   Social Determinants of Health   Financial Resource Strain: Not on file  Food Insecurity: No Food Insecurity (05/20/2023)   Hunger Vital Sign    Worried About Running Out of Food in the Last Year: Never true  Ran Out of Food in the Last Year: Never true  Transportation Needs: Patient Unable To Answer (05/20/2023)   PRAPARE - Transportation    Lack of Transportation (Medical): Patient unable to answer    Lack of Transportation (Non-Medical): Patient unable to answer  Physical Activity: Not on file  Stress: Not on file  Social Connections: Not on file  Intimate Partner Violence: Not At Risk (05/20/2023)   Humiliation, Afraid, Rape, and Kick questionnaire    Fear of Current or Ex-Partner: No    Emotionally Abused: No    Physically Abused: No    Sexually Abused: No      Review of Systems     Objective:    BP 127/87   Pulse 72   Resp 16   Ht 4\' 11"  (1.499 m)   Wt 104 lb (47.2 kg)   BMI 21.01 kg/m  Nursing note  and vital signs reviewed.  Physical Exam     General/constitutional: no distress, pleasant; 1 liters oxygen via [place]; in wheel chair (uses fww at nursing home) HEENT: Normocephalic, PER, Conj Clear, EOMI, Oropharynx clear Neck supple CV: rrr no mrg Lungs: clear to auscultation, normal respiratory effort Abd: Soft, Nontender Ext: no edema Skin: No Rash Neuro: nonfocal MSK: no peripheral joint swelling/tenderness/warmth; back spines nontender     Labs: Lab Results  Component Value Date   WBC 13.2 (H) 05/27/2023   HGB 9.0 (L) 05/27/2023   HCT 27.1 (L) 05/27/2023   MCV 103.8 (H) 05/27/2023   PLT 455 (H) 05/27/2023   Last metabolic panel Lab Results  Component Value Date   GLUCOSE 90 05/27/2023   NA 134 (L) 05/27/2023   K 4.4 05/27/2023   CL 103 05/27/2023   CO2 23 05/27/2023   BUN 12 05/27/2023   CREATININE 0.90 05/27/2023   GFRNONAA >60 05/27/2023   CALCIUM 8.4 (L) 05/27/2023   PHOS 4.1 05/20/2023   PROT 5.9 (L) 05/23/2023   ALBUMIN 2.2 (L) 05/20/2023   BILITOT 1.4 (H) 05/17/2023   ALKPHOS 127 (H) 05/17/2023   AST 19 05/17/2023   ALT 16 05/17/2023   ANIONGAP 8 05/27/2023    Micro:  Serology:  Imaging:  Assessment & Plan:   Problem List Items Addressed This Visit   None Visit Diagnoses     Empyema (HCC)    -  Primary   Relevant Orders   DG Chest 2 View   CBC   COMPLETE METABOLIC PANEL WITH GFR   C-reactive protein         No orders of the defined types were placed in this encounter.    Abx: 10/28-c amox-clav   10/25-28 ceftriaxone 10/25-26 azith     ASSESSMENT: 89 yo female admitted with sepsis and pna 10/25, complicated by right sided pleural effusion and strep pna bacteremia     Patient hard at hearing   Wbc improving Afebrile Stable  hemodynamics Appears deconditioned   10/28 chest ct showed large possibly loculated pleural effusion right side     05/22/23 id assessment Pulm/ccm following and patient's family (son) had  consented to thoracentesis -- per pulmonology bedside US pleural effusion appears simple. First attempt thoracentesis unsuccessful as patient not able to be positioned right Moderate leukocytosis not improving Still clinically ill; prognosis guarded       05/24/23 id assessment On 10/31 pulm was able to do thoracentesis but only 50 cc removed and a large fissural loculation remains. Light's criteria suggest exudative fluid Cx of fluid so far ngtd but  given the amount of abx, I suspect the strep pnae wont grow   Given our inability to do source control, I plan a long course of abx. Cure expectation guarded   Slight decrease in wbc by one day of unclear significance   06/24/23 id clinic assessment Patient released from hospital to snf on 05/27/23 I reviewed snf mar and she is taking amox-clav No abx side effect  She hasn't followed up with pulm and didn't know if she needs to Continue amox-clav for the 6 weeks until 07/05/23  She has been getting 1 liter oxygen via [place] at nursing   She is here with her son today. She has no complaint except feeling cold now. Has been eating well. No n/v/diarrhea   -xray chest ordered -labs today -will send chart to pulmonology to follow up with her -f/u with me in 3weeks -continue amox-clav until she sees me      Follow-up: Return in about 4 weeks (around 07/22/2023).      Raymondo Band, MD [facility] 06/24/2023, 3:10 PM

## 2023-06-25 DIAGNOSIS — I480 Paroxysmal atrial fibrillation: Secondary | ICD-10-CM | POA: Diagnosis not present

## 2023-06-25 DIAGNOSIS — R278 Other lack of coordination: Secondary | ICD-10-CM | POA: Diagnosis not present

## 2023-06-25 DIAGNOSIS — R7881 Bacteremia: Secondary | ICD-10-CM | POA: Diagnosis not present

## 2023-06-25 DIAGNOSIS — F32A Depression, unspecified: Secondary | ICD-10-CM | POA: Diagnosis not present

## 2023-06-25 DIAGNOSIS — F419 Anxiety disorder, unspecified: Secondary | ICD-10-CM | POA: Diagnosis not present

## 2023-06-25 DIAGNOSIS — R059 Cough, unspecified: Secondary | ICD-10-CM | POA: Diagnosis not present

## 2023-06-25 DIAGNOSIS — M6281 Muscle weakness (generalized): Secondary | ICD-10-CM | POA: Diagnosis not present

## 2023-06-25 DIAGNOSIS — R0989 Other specified symptoms and signs involving the circulatory and respiratory systems: Secondary | ICD-10-CM | POA: Diagnosis not present

## 2023-06-25 LAB — COMPLETE METABOLIC PANEL WITH GFR
AG Ratio: 1.1 (calc) (ref 1.0–2.5)
ALT: 10 U/L (ref 6–29)
AST: 13 U/L (ref 10–35)
Albumin: 3.3 g/dL — ABNORMAL LOW (ref 3.6–5.1)
Alkaline phosphatase (APISO): 136 U/L (ref 37–153)
BUN/Creatinine Ratio: 17 (calc) (ref 6–22)
BUN: 20 mg/dL (ref 7–25)
CO2: 28 mmol/L (ref 20–32)
Calcium: 8.8 mg/dL (ref 8.6–10.4)
Chloride: 101 mmol/L (ref 98–110)
Creat: 1.17 mg/dL — ABNORMAL HIGH (ref 0.60–0.95)
Globulin: 2.9 g/dL (ref 1.9–3.7)
Glucose, Bld: 105 mg/dL — ABNORMAL HIGH (ref 65–99)
Potassium: 5 mmol/L (ref 3.5–5.3)
Sodium: 135 mmol/L (ref 135–146)
Total Bilirubin: 0.3 mg/dL (ref 0.2–1.2)
Total Protein: 6.2 g/dL (ref 6.1–8.1)
eGFR: 44 mL/min/{1.73_m2} — ABNORMAL LOW (ref >=60)

## 2023-06-25 LAB — CBC
HCT: 35.4 % (ref 35.0–45.0)
Hemoglobin: 11.6 g/dL — ABNORMAL LOW (ref 11.7–15.5)
MCH: 33.5 pg — ABNORMAL HIGH (ref 27.0–33.0)
MCHC: 32.8 g/dL (ref 32.0–36.0)
MCV: 102.3 fL — ABNORMAL HIGH (ref 80.0–100.0)
MPV: 8.9 fL (ref 7.5–12.5)
Platelets: 349 10*3/uL (ref 140–400)
RBC: 3.46 10*6/uL — ABNORMAL LOW (ref 3.80–5.10)
RDW: 13.7 % (ref 11.0–15.0)
WBC: 11.5 10*3/uL — ABNORMAL HIGH (ref 3.8–10.8)

## 2023-06-25 LAB — C-REACTIVE PROTEIN: CRP: 5.7 mg/L (ref <8.0)

## 2023-06-28 DIAGNOSIS — F331 Major depressive disorder, recurrent, moderate: Secondary | ICD-10-CM | POA: Diagnosis not present

## 2023-06-28 DIAGNOSIS — N183 Chronic kidney disease, stage 3 unspecified: Secondary | ICD-10-CM | POA: Diagnosis not present

## 2023-06-28 DIAGNOSIS — M6281 Muscle weakness (generalized): Secondary | ICD-10-CM | POA: Diagnosis not present

## 2023-06-28 DIAGNOSIS — A419 Sepsis, unspecified organism: Secondary | ICD-10-CM | POA: Diagnosis not present

## 2023-06-28 DIAGNOSIS — J13 Pneumonia due to Streptococcus pneumoniae: Secondary | ICD-10-CM | POA: Diagnosis not present

## 2023-06-28 DIAGNOSIS — D72829 Elevated white blood cell count, unspecified: Secondary | ICD-10-CM | POA: Diagnosis not present

## 2023-06-28 DIAGNOSIS — F411 Generalized anxiety disorder: Secondary | ICD-10-CM | POA: Diagnosis not present

## 2023-06-28 DIAGNOSIS — I4891 Unspecified atrial fibrillation: Secondary | ICD-10-CM | POA: Diagnosis not present

## 2023-07-02 NOTE — Progress Notes (Signed)
Sounds good Alicia Schwartz  I will see her after xray and if anything concerning i will touch base with you  Thanks Nelani Schmelzle

## 2023-07-23 ENCOUNTER — Ambulatory Visit: Payer: Medicare HMO | Admitting: Internal Medicine

## 2023-12-20 NOTE — Transitions of Care (Post Inpatient/ED Visit) (Signed)
 12/20/2023  Patient ID: Alicia Schwartz, female   DOB: [birth year], 89 y.o.   MRN: [id number]   Chart Review for transitions of care.    [name] RN, MSN [facility], [facility] RN Care Manager Direct Dial: [phone]  Fax: [phone] Website: [email or web address]

## 2024-01-01 DIAGNOSIS — R7989 Other specified abnormal findings of blood chemistry: Secondary | ICD-10-CM | POA: Diagnosis not present

## 2024-01-01 DIAGNOSIS — I10 Essential (primary) hypertension: Secondary | ICD-10-CM | POA: Diagnosis not present

## 2024-01-01 DIAGNOSIS — D649 Anemia, unspecified: Secondary | ICD-10-CM | POA: Diagnosis not present

## 2024-01-01 DIAGNOSIS — R7301 Impaired fasting glucose: Secondary | ICD-10-CM | POA: Diagnosis not present

## 2024-01-01 DIAGNOSIS — R413 Other amnesia: Secondary | ICD-10-CM | POA: Diagnosis not present

## 2024-01-01 DIAGNOSIS — G894 Chronic pain syndrome: Secondary | ICD-10-CM | POA: Diagnosis not present

## 2024-01-01 DIAGNOSIS — R531 Weakness: Secondary | ICD-10-CM | POA: Diagnosis not present

## 2024-01-01 DIAGNOSIS — E7800 Pure hypercholesterolemia, unspecified: Secondary | ICD-10-CM | POA: Diagnosis not present

## 2024-01-01 DIAGNOSIS — R5381 Other malaise: Secondary | ICD-10-CM | POA: Diagnosis not present

## 2024-03-05 DIAGNOSIS — E7800 Pure hypercholesterolemia, unspecified: Secondary | ICD-10-CM | POA: Diagnosis not present

## 2024-03-05 DIAGNOSIS — R5381 Other malaise: Secondary | ICD-10-CM | POA: Diagnosis not present

## 2024-03-05 DIAGNOSIS — N1832 Chronic kidney disease, stage 3b: Secondary | ICD-10-CM | POA: Diagnosis not present

## 2024-03-05 DIAGNOSIS — E039 Hypothyroidism, unspecified: Secondary | ICD-10-CM | POA: Diagnosis not present

## 2024-03-05 DIAGNOSIS — I10 Essential (primary) hypertension: Secondary | ICD-10-CM | POA: Diagnosis not present

## 2024-03-05 DIAGNOSIS — R451 Restlessness and agitation: Secondary | ICD-10-CM | POA: Diagnosis not present

## 2024-03-05 DIAGNOSIS — R413 Other amnesia: Secondary | ICD-10-CM | POA: Diagnosis not present
